# Patient Record
Sex: Female | Born: 1960 | Race: White | Hispanic: No | Marital: Married | Smoking: Former smoker
Health system: Southern US, Community
[De-identification: ages and names within clinical notes are randomized; demographics above are authoritative.]

## PROBLEM LIST (undated history)

## (undated) DIAGNOSIS — C439 Malignant melanoma of skin, unspecified: Secondary | ICD-10-CM

## (undated) DIAGNOSIS — E039 Hypothyroidism, unspecified: Secondary | ICD-10-CM

## (undated) DIAGNOSIS — C73 Malignant neoplasm of thyroid gland: Secondary | ICD-10-CM

## (undated) HISTORY — PX: THYROIDECTOMY, PARTIAL: SHX18

---

## 1999-03-26 DIAGNOSIS — C73 Malignant neoplasm of thyroid gland: Secondary | ICD-10-CM

## 1999-03-26 HISTORY — DX: Malignant neoplasm of thyroid gland: C73

## 2003-03-26 HISTORY — PX: BREAST BIOPSY: SHX20

## 2004-10-17 ENCOUNTER — Ambulatory Visit: Payer: Self-pay | Admitting: Internal Medicine

## 2005-09-04 ENCOUNTER — Ambulatory Visit: Payer: Self-pay | Admitting: Internal Medicine

## 2005-10-31 ENCOUNTER — Ambulatory Visit: Payer: Self-pay | Admitting: Internal Medicine

## 2006-03-03 ENCOUNTER — Ambulatory Visit: Payer: Self-pay | Admitting: Internal Medicine

## 2006-11-03 ENCOUNTER — Ambulatory Visit: Payer: Self-pay | Admitting: Internal Medicine

## 2007-03-30 ENCOUNTER — Ambulatory Visit: Payer: Self-pay | Admitting: Internal Medicine

## 2009-03-25 DIAGNOSIS — C439 Malignant melanoma of skin, unspecified: Secondary | ICD-10-CM

## 2009-03-25 HISTORY — PX: MELANOMA EXCISION: SHX5266

## 2009-03-25 HISTORY — DX: Malignant melanoma of skin, unspecified: C43.9

## 2009-06-09 ENCOUNTER — Ambulatory Visit: Payer: Self-pay | Admitting: Internal Medicine

## 2010-07-03 ENCOUNTER — Ambulatory Visit: Payer: Self-pay | Admitting: Internal Medicine

## 2011-10-31 ENCOUNTER — Ambulatory Visit: Payer: Self-pay | Admitting: Family Medicine

## 2012-11-03 ENCOUNTER — Ambulatory Visit: Payer: Self-pay | Admitting: Family Medicine

## 2014-02-08 ENCOUNTER — Ambulatory Visit: Payer: Self-pay | Admitting: Family Medicine

## 2015-01-31 ENCOUNTER — Other Ambulatory Visit: Payer: Self-pay | Admitting: Family Medicine

## 2015-01-31 DIAGNOSIS — Z1231 Encounter for screening mammogram for malignant neoplasm of breast: Secondary | ICD-10-CM

## 2015-02-14 ENCOUNTER — Ambulatory Visit
Admission: RE | Admit: 2015-02-14 | Discharge: 2015-02-14 | Disposition: A | Discharge status: Home / Self Care | Payer: BC Managed Care – PPO | Source: Ambulatory Visit | Attending: Family Medicine | Admitting: Family Medicine

## 2015-02-14 DIAGNOSIS — Z1231 Encounter for screening mammogram for malignant neoplasm of breast: Secondary | ICD-10-CM | POA: Diagnosis not present

## 2015-02-15 ENCOUNTER — Other Ambulatory Visit: Payer: Self-pay | Admitting: Psychiatry

## 2016-05-29 ENCOUNTER — Other Ambulatory Visit: Payer: Self-pay | Admitting: Family Medicine

## 2016-05-29 DIAGNOSIS — Z1231 Encounter for screening mammogram for malignant neoplasm of breast: Secondary | ICD-10-CM

## 2016-06-04 ENCOUNTER — Ambulatory Visit: Payer: BC Managed Care – PPO

## 2016-06-11 ENCOUNTER — Ambulatory Visit
Admission: RE | Admit: 2016-06-11 | Discharge: 2016-06-11 | Disposition: A | Discharge status: Home / Self Care | Payer: BC Managed Care – PPO | Source: Ambulatory Visit | Attending: Family Medicine | Admitting: Family Medicine

## 2016-06-11 DIAGNOSIS — Z1231 Encounter for screening mammogram for malignant neoplasm of breast: Secondary | ICD-10-CM | POA: Insufficient documentation

## 2016-06-12 ENCOUNTER — Other Ambulatory Visit: Payer: Self-pay | Admitting: Family Medicine

## 2016-06-12 DIAGNOSIS — R928 Other abnormal and inconclusive findings on diagnostic imaging of breast: Secondary | ICD-10-CM

## 2016-06-28 ENCOUNTER — Ambulatory Visit
Admission: RE | Admit: 2016-06-28 | Discharge: 2016-06-28 | Disposition: A | Discharge status: Home / Self Care | Payer: BC Managed Care – PPO | Source: Ambulatory Visit | Attending: Family Medicine | Admitting: Family Medicine

## 2016-06-28 DIAGNOSIS — R928 Other abnormal and inconclusive findings on diagnostic imaging of breast: Secondary | ICD-10-CM

## 2016-06-28 DIAGNOSIS — N6320 Unspecified lump in the left breast, unspecified quadrant: Secondary | ICD-10-CM | POA: Insufficient documentation

## 2016-06-28 DIAGNOSIS — N6489 Other specified disorders of breast: Secondary | ICD-10-CM | POA: Diagnosis not present

## 2016-07-02 ENCOUNTER — Other Ambulatory Visit: Payer: Self-pay | Admitting: Family Medicine

## 2016-07-02 DIAGNOSIS — R928 Other abnormal and inconclusive findings on diagnostic imaging of breast: Secondary | ICD-10-CM

## 2016-07-02 DIAGNOSIS — N6320 Unspecified lump in the left breast, unspecified quadrant: Secondary | ICD-10-CM

## 2016-07-11 ENCOUNTER — Ambulatory Visit
Admission: RE | Admit: 2016-07-11 | Discharge: 2016-07-11 | Disposition: A | Discharge status: Home / Self Care | Payer: BC Managed Care – PPO | Source: Ambulatory Visit | Attending: Family Medicine | Admitting: Family Medicine

## 2016-07-11 DIAGNOSIS — N62 Hypertrophy of breast: Secondary | ICD-10-CM | POA: Insufficient documentation

## 2016-07-11 DIAGNOSIS — N6320 Unspecified lump in the left breast, unspecified quadrant: Secondary | ICD-10-CM | POA: Diagnosis present

## 2016-07-11 DIAGNOSIS — R928 Other abnormal and inconclusive findings on diagnostic imaging of breast: Secondary | ICD-10-CM

## 2016-07-11 HISTORY — PX: BREAST BIOPSY: SHX20

## 2016-07-12 LAB — SURGICAL PATHOLOGY

## 2016-07-29 ENCOUNTER — Other Ambulatory Visit: Payer: Self-pay | Admitting: Surgery

## 2016-07-29 DIAGNOSIS — N6320 Unspecified lump in the left breast, unspecified quadrant: Secondary | ICD-10-CM

## 2016-07-31 ENCOUNTER — Encounter
Admission: RE | Admit: 2016-07-31 | Discharge: 2016-07-31 | Disposition: A | Discharge status: Home / Self Care | Payer: BC Managed Care – PPO | Source: Ambulatory Visit | Attending: Surgery | Admitting: Surgery

## 2016-07-31 HISTORY — DX: Hypothyroidism, unspecified: E03.9

## 2016-07-31 NOTE — Patient Instructions (Addendum)
  Your procedure is scheduled on: 08-06-16 [place] @ 7:45 AM  Remember: Instructions that are not followed completely may result in serious medical risk, up to and including death, or upon the discretion of your surgeon and anesthesiologist your surgery may need to be rescheduled.    _x___ 1. Do not eat food or drink liquids after midnight. No gum chewing or  hard candies.     __x__ 2. No Alcohol for 24 hours before or after surgery.   __x__3. No Smoking for 24 prior to surgery.   ____  4. Bring all medications with you on the day of surgery if instructed.    __x__ 5. Notify your doctor if there is any change in your medical condition     (cold, fever, infections).     Do not wear jewelry, make-up, hairpins, clips or nail polish.  Do not wear lotions, powders, or perfumes. You may wear deodorant.  Do not shave 48 hours prior to surgery. Men may shave face and neck.  Do not bring valuables to the hospital.    [facility] is not responsible for any belongings or valuables.               Contacts, dentures or bridgework may not be worn into surgery.  Leave your suitcase in the car. After surgery it may be brought to your room.  For patients admitted to the hospital, discharge time is determined by your                       treatment team.   Patients discharged the day of surgery will not be allowed to drive home.  You will need someone to drive you home and stay with you the night of your procedure.    Please read over the following fact sheets that you were given:   [facility] Preparing for Surgery and or MRSA Information   _x___ Take anti-hypertensive (unless it includes a diuretic), cardiac, seizure, asthma,     anti-reflux and psychiatric medicines. These include:  1. LEVOTHYROXINE  2.  3.  4.  5.  6.  ____Fleets enema or Magnesium Citrate as directed.   ____ Use CHG Soap or sage wipes as directed on instruction sheet   ____ Use inhalers on the day  of surgery and bring to hospital day of surgery  ____ Stop Metformin and Janumet 2 days prior to surgery.    ____ Take 1/2 of usual insulin dose the night before surgery and none on the morning     surgery.   ____ Follow recommendations from Cardiologist, Pulmonologist or PCP regarding          stopping Aspirin, Coumadin, Pllavix ,Eliquis, Effient, or Pradaxa, and Pletal.  X____Stop Anti-inflammatories such as Advil, Aleve, Ibuprofen, Motrin, Naproxen, Naprosyn, Goodies powders or aspirin products NOW-OK to take Tylenol    ____ Stop supplements until after surgery.     ____ Bring C-Pap to the hospital.

## 2016-08-06 ENCOUNTER — Ambulatory Visit
Admission: RE | Admit: 2016-08-06 | Discharge: 2016-08-06 | Disposition: A | Discharge status: Home / Self Care | Payer: BC Managed Care – PPO | Source: Ambulatory Visit | Attending: Surgery | Admitting: Surgery

## 2016-08-06 ENCOUNTER — Encounter: Payer: Self-pay | Admitting: *Deleted

## 2016-08-06 ENCOUNTER — Encounter
Admission: RE | Disposition: A | Discharge status: Home / Self Care | Payer: Self-pay | Source: Ambulatory Visit | Attending: Surgery

## 2016-08-06 ENCOUNTER — Ambulatory Visit: Payer: BC Managed Care – PPO | Admitting: Certified Registered"

## 2016-08-06 DIAGNOSIS — Z87891 Personal history of nicotine dependence: Secondary | ICD-10-CM | POA: Insufficient documentation

## 2016-08-06 DIAGNOSIS — Z79899 Other long term (current) drug therapy: Secondary | ICD-10-CM | POA: Diagnosis not present

## 2016-08-06 DIAGNOSIS — Z85820 Personal history of malignant melanoma of skin: Secondary | ICD-10-CM | POA: Diagnosis not present

## 2016-08-06 DIAGNOSIS — Z85850 Personal history of malignant neoplasm of thyroid: Secondary | ICD-10-CM | POA: Insufficient documentation

## 2016-08-06 DIAGNOSIS — E890 Postprocedural hypothyroidism: Secondary | ICD-10-CM | POA: Insufficient documentation

## 2016-08-06 DIAGNOSIS — N62 Hypertrophy of breast: Secondary | ICD-10-CM | POA: Diagnosis not present

## 2016-08-06 DIAGNOSIS — E7800 Pure hypercholesterolemia, unspecified: Secondary | ICD-10-CM | POA: Diagnosis not present

## 2016-08-06 DIAGNOSIS — N6321 Unspecified lump in the left breast, upper outer quadrant: Secondary | ICD-10-CM | POA: Diagnosis present

## 2016-08-06 DIAGNOSIS — N6022 Fibroadenosis of left breast: Secondary | ICD-10-CM | POA: Insufficient documentation

## 2016-08-06 DIAGNOSIS — Z85828 Personal history of other malignant neoplasm of skin: Secondary | ICD-10-CM | POA: Insufficient documentation

## 2016-08-06 DIAGNOSIS — N6320 Unspecified lump in the left breast, unspecified quadrant: Secondary | ICD-10-CM

## 2016-08-06 HISTORY — PX: BREAST LUMPECTOMY WITH NEEDLE LOCALIZATION: SHX5759

## 2016-08-06 HISTORY — PX: BREAST EXCISIONAL BIOPSY: SUR124

## 2016-08-06 SURGERY — BREAST LUMPECTOMY WITH NEEDLE LOCALIZATION
Anesthesia: General | Laterality: Left | Wound class: Clean

## 2016-08-06 MED ORDER — LACTATED RINGERS IV SOLN
INTRAVENOUS | Status: DC
  Administered 2016-08-06: 50 mL/h via INTRAVENOUS

## 2016-08-06 MED ORDER — MIDAZOLAM HCL 2 MG/2ML IJ SOLN
INTRAMUSCULAR | Status: AC
  Filled 2016-08-06: qty 2

## 2016-08-06 MED ORDER — PROPOFOL 10 MG/ML IV BOLUS
INTRAVENOUS | Status: AC
  Filled 2016-08-06: qty 20

## 2016-08-06 MED ORDER — FENTANYL CITRATE (PF) 100 MCG/2ML IJ SOLN
INTRAMUSCULAR | Status: DC | PRN
  Administered 2016-08-06 (×2): 50 ug via INTRAVENOUS

## 2016-08-06 MED ORDER — DEXAMETHASONE SODIUM PHOSPHATE 10 MG/ML IJ SOLN
INTRAMUSCULAR | Status: DC | PRN
  Administered 2016-08-06: 4 mg via INTRAVENOUS

## 2016-08-06 MED ORDER — HYDROCODONE-ACETAMINOPHEN 5-325 MG PO TABS: 1.0000 | ORAL_TABLET | ORAL | 0 refills | Status: DC | PRN

## 2016-08-06 MED ORDER — FAMOTIDINE 20 MG PO TABS
20.0000 mg | ORAL_TABLET | Freq: Once | ORAL | Status: AC
  Administered 2016-08-06: 20 mg via ORAL

## 2016-08-06 MED ORDER — DEXAMETHASONE SODIUM PHOSPHATE 10 MG/ML IJ SOLN
INTRAMUSCULAR | Status: AC
  Filled 2016-08-06: qty 1

## 2016-08-06 MED ORDER — HYDROCODONE-ACETAMINOPHEN 5-325 MG PO TABS: 1.0000 | ORAL_TABLET | ORAL | Status: DC | PRN

## 2016-08-06 MED ORDER — FENTANYL CITRATE (PF) 100 MCG/2ML IJ SOLN
INTRAMUSCULAR | Status: AC
  Filled 2016-08-06: qty 4

## 2016-08-06 MED ORDER — MIDAZOLAM HCL 2 MG/2ML IJ SOLN
INTRAMUSCULAR | Status: DC | PRN
  Administered 2016-08-06: 2 mg via INTRAVENOUS

## 2016-08-06 MED ORDER — ONDANSETRON HCL 4 MG/2ML IJ SOLN: 4.0000 mg | Freq: Once | INTRAMUSCULAR | Status: DC | PRN

## 2016-08-06 MED ORDER — FAMOTIDINE 20 MG PO TABS
ORAL_TABLET | ORAL | Status: AC
  Administered 2016-08-06: 20 mg via ORAL
  Filled 2016-08-06: qty 1

## 2016-08-06 MED ORDER — ONDANSETRON HCL 4 MG/2ML IJ SOLN
INTRAMUSCULAR | Status: AC
  Filled 2016-08-06: qty 2

## 2016-08-06 MED ORDER — PROPOFOL 10 MG/ML IV BOLUS
INTRAVENOUS | Status: DC | PRN
  Administered 2016-08-06: 200 mg via INTRAVENOUS

## 2016-08-06 MED ORDER — BUPIVACAINE-EPINEPHRINE (PF) 0.5% -1:200000 IJ SOLN
INTRAMUSCULAR | Status: AC
  Filled 2016-08-06: qty 30

## 2016-08-06 MED ORDER — BUPIVACAINE-EPINEPHRINE 0.5% -1:200000 IJ SOLN
INTRAMUSCULAR | Status: DC | PRN
  Administered 2016-08-06: 10 mL

## 2016-08-06 MED ORDER — FENTANYL CITRATE (PF) 100 MCG/2ML IJ SOLN
25.0000 ug | INTRAMUSCULAR | Status: DC | PRN
Start: 2016-08-06 — End: 2016-08-06

## 2016-08-06 MED ORDER — ONDANSETRON HCL 4 MG/2ML IJ SOLN
INTRAMUSCULAR | Status: DC | PRN
  Administered 2016-08-06: 4 mg via INTRAVENOUS

## 2016-08-06 SURGICAL SUPPLY — 27 items
BLADE SURG 15 STRL LF DISP TIS (BLADE) ×1 IMPLANT
BLADE SURG 15 STRL SS (BLADE) ×2
CANISTER SUCT 1200ML W/VALVE (MISCELLANEOUS) ×3 IMPLANT
CHLORAPREP W/TINT 26ML (MISCELLANEOUS) ×3 IMPLANT
DERMABOND ADVANCED (GAUZE/BANDAGES/DRESSINGS) ×2
DERMABOND ADVANCED .7 DNX12 (GAUZE/BANDAGES/DRESSINGS) ×1 IMPLANT
DEVICE DUBIN SPECIMEN MAMMOGRA (MISCELLANEOUS) ×3 IMPLANT
DRAPE LAPAROTOMY 77X122 PED (DRAPES) ×3 IMPLANT
ELECT REM PT RETURN 9FT ADLT (ELECTROSURGICAL) ×3
ELECTRODE REM PT RTRN 9FT ADLT (ELECTROSURGICAL) ×1 IMPLANT
GLOVE BIO SURGEON STRL SZ7.5 (GLOVE) ×3 IMPLANT
GOWN STRL REUS W/ TWL LRG LVL3 (GOWN DISPOSABLE) ×2 IMPLANT
GOWN STRL REUS W/TWL LRG LVL3 (GOWN DISPOSABLE) ×4
KIT RM TURNOVER STRD PROC AR (KITS) ×3 IMPLANT
LABEL OR SOLS (LABEL) ×3 IMPLANT
MARGIN MAP 10MM (MISCELLANEOUS) ×3 IMPLANT
NEEDLE HYPO 25X1 1.5 SAFETY (NEEDLE) ×3 IMPLANT
PACK BASIN MINOR ARMC (MISCELLANEOUS) ×3 IMPLANT
SUT CHROMIC 3 0 SH 27 (SUTURE) IMPLANT
SUT CHROMIC 4 0 RB 1X27 (SUTURE) ×3 IMPLANT
SUT ETHILON 3-0 FS-10 30 BLK (SUTURE) ×3
SUT MNCRL 4-0 (SUTURE) ×2
SUT MNCRL 4-0 27XMFL (SUTURE) ×1
SUTURE EHLN 3-0 FS-10 30 BLK (SUTURE) ×1 IMPLANT
SUTURE MNCRL 4-0 27XMF (SUTURE) ×1 IMPLANT
SYRINGE 10CC LL (SYRINGE) ×3 IMPLANT
WATER STERILE IRR 1000ML POUR (IV SOLUTION) ×3 IMPLANT

## 2016-08-06 NOTE — Anesthesia Postprocedure Evaluation (Signed)
Anesthesia Post Note  Patient: Tanya Vega  Procedure(s) Performed: Procedure(s) (LRB): BREAST LUMPECTOMY WITH NEEDLE LOCALIZATION (Left)  Patient location during evaluation: PACU Anesthesia Type: General Level of consciousness: awake and alert Pain management: pain level controlled Vital Signs Assessment: post-procedure vital signs reviewed and stable Respiratory status: spontaneous breathing and respiratory function stable Cardiovascular status: stable Anesthetic complications: no     Last Vitals:  Vitals:   08/06/16 1217 08/06/16 1246  BP: 136/79 136/75  Pulse:  75  Resp:  16  Temp:      Last Pain:  Vitals:   08/06/16 1214  TempSrc: Temporal                 [name] K

## 2016-08-06 NOTE — H&P (Signed)
  She reports no change in condition since office exam.  Has had Kopan's wire inserted , viewed mammograms.  Labs noted.  Discussed plan for excision left breast mass.  Left side marked YES

## 2016-08-06 NOTE — Transfer of Care (Signed)
Immediate Anesthesia Transfer of Care Note  Patient: Tanya Vega  Procedure(s) Performed: Procedure(s): BREAST LUMPECTOMY WITH NEEDLE LOCALIZATION (Left)  Patient Location: PACU  Anesthesia Type:General  Level of Consciousness: awake and responds to stimulation  Airway & Oxygen Therapy: Patient Spontanous Breathing and Patient connected to face mask oxygen  Post-op Assessment: Report given to RN and Post -op Vital signs reviewed and stable  Post vital signs: Reviewed and stable  Last Vitals:  Vitals:   08/06/16 1132 08/06/16 1133  BP: (P) 126/77 126/77  Pulse:  96  Resp:  13  Temp: (P) 37 C     Last Pain:  Vitals:   08/06/16 0854  TempSrc: Tympanic      Patients Stated Pain Goal: 0 (41/28/78 6767)  Complications: No apparent anesthesia complications

## 2016-08-06 NOTE — Anesthesia Post-op Follow-up Note (Cosign Needed)
Anesthesia QCDR form completed.        

## 2016-08-06 NOTE — Op Note (Signed)
OPERATIVE REPORT  PREOPERATIVE  DIAGNOSIS: . Left breast mass  POSTOPERATIVE DIAGNOSIS: . Left breast mass  PROCEDURE: . Excision left breast mass  ANESTHESIA:  General  SURGEON: Rochel Brome  MD   INDICATIONS: . She had recent radiographic findings of an abnormality in the upper outer quadrant of the left breast ultrasound demonstrated a 7 mm nodule. Biopsy demonstrated a radial scar. Excision was recommended for further evaluation. She did have preoperative x-ray needle localization with insertion of Kopan's wire. The images were reviewed prior to incision.  With the patient on the operating table in the supine position she was placed under general anesthesia. The dressing was removed from the left breast exposing the Kopan's wire which entered the breast at approximately 2:30 position. The wire was cut 2 cm from the skin. The breast was prepared with ChloraPrep and draped in a sterile manner. A curvilinear incision was made in the upper outer quadrant 4 cm from the nipple carried down through subcutaneous tissues to encounter the wire. A portion of tissue surrounding the wire was removed which was approximately 2 x 2 x 3 cm in dimension. This was labeled with margin maps suturing markers to the medial lateral cranial caudal and deep margins. The Kopan's wire was left intact with the specimen. The specimen mammogram demonstrated the location of the biopsy marker in the specimen. The specimen was submitted for pathology.  The wound was inspected and several small bleeding points were cauterized. The subcuticular tissues were infiltrated with half percent Sensorcaine with epinephrine. Deeper tissues surrounding cautery artifact were also infiltrated. Subcutaneous tissues were approximated with 4-0 chromic. The skin was closed with running 4-0 Monocryl subcuticular suture and Dermabond.  The patient tolerated surgery satisfactorily and was then prepared for transfer to the recovery room  [facility].D.

## 2016-08-06 NOTE — Anesthesia Procedure Notes (Signed)
Procedure Name: LMA Insertion Performed by: Lance Muss Pre-anesthesia Checklist: Patient identified, Patient being monitored, Timeout performed, Emergency Drugs available and Suction available Patient Re-evaluated:Patient Re-evaluated prior to inductionOxygen Delivery Method: Circle system utilized Preoxygenation: Pre-oxygenation with 100% oxygen Intubation Type: IV induction Ventilation: Mask ventilation without difficulty LMA: LMA inserted LMA Size: 3.5 Tube type: Oral Number of attempts: 1 Placement Confirmation: positive ETCO2 and breath sounds checked- equal and bilateral Tube secured with: Tape Dental Injury: Teeth and Oropharynx as per pre-operative assessment

## 2016-08-06 NOTE — Discharge Instructions (Addendum)
Take Tylenol or Norco if needed for pain. ° °Should not drive or do anything dangerous when taking Norco. ° °May shower and blot dry. ° °Wear bra as desired for comfort and support. ° °AMBULATORY SURGERY  °DISCHARGE INSTRUCTIONS ° ° °1) The drugs that you were given will stay in your system until tomorrow so for the next 24 hours you should not: ° °A) Drive an automobile °B) Make any legal decisions °C) Drink any alcoholic beverage ° ° °2) You may resume regular meals tomorrow.  Today it is better to start with liquids and gradually work up to solid foods. ° °You may eat anything you prefer, but it is better to start with liquids, then soup and crackers, and gradually work up to solid foods. ° ° °3) Please notify your doctor immediately if you have any unusual bleeding, trouble breathing, redness and pain at the surgery site, drainage, fever, or pain not relieved by medication. ° ° ° °4) Additional Instructions: ° ° ° ° ° ° ° °Please contact your physician with any problems or Same Day Surgery at [phone], Monday through Friday 6 am to 4 pm, or  at [place] Main number at [phone]. °

## 2016-08-06 NOTE — Anesthesia Preprocedure Evaluation (Signed)
Anesthesia Evaluation  Patient identified by MRN, date of birth, ID band Patient awake    Reviewed: Allergy & Precautions, NPO status , Patient's Chart, lab work & pertinent test results  History of Anesthesia Complications Negative for: history of anesthetic complications  Airway Mallampati: II       Dental   Pulmonary neg pulmonary ROS, former smoker,           Cardiovascular negative cardio ROS       Neuro/Psych negative neurological ROS     GI/Hepatic negative GI ROS, Neg liver ROS,   Endo/Other  negative endocrine ROSHypothyroidism (s/p thyroid cancer)   Renal/GU negative Renal ROS     Musculoskeletal   Abdominal   Peds  Hematology negative hematology ROS (+)   Anesthesia Other Findings   Reproductive/Obstetrics                             Anesthesia Physical Anesthesia Plan  ASA: II  Anesthesia Plan: General   Post-op Pain Management:    Induction: Intravenous  Airway Management Planned: LMA  Additional Equipment:   Intra-op Plan:   Post-operative Plan:   Informed Consent: I have reviewed the patients History and Physical, chart, labs and discussed the procedure including the risks, benefits and alternatives for the proposed anesthesia with the patient or authorized representative who has indicated his/her understanding and acceptance.     Plan Discussed with:   Anesthesia Plan Comments:         Anesthesia Quick Evaluation

## 2016-08-07 LAB — SURGICAL PATHOLOGY

## 2017-02-06 ENCOUNTER — Other Ambulatory Visit: Payer: Self-pay | Admitting: Family Medicine

## 2017-02-06 DIAGNOSIS — R109 Unspecified abdominal pain: Secondary | ICD-10-CM

## 2017-02-12 ENCOUNTER — Other Ambulatory Visit: Payer: Self-pay | Admitting: Internal Medicine

## 2017-02-12 ENCOUNTER — Ambulatory Visit
Admission: RE | Admit: 2017-02-12 | Discharge: 2017-02-12 | Disposition: A | Discharge status: Home / Self Care | Payer: BC Managed Care – PPO | Source: Ambulatory Visit | Attending: Family Medicine | Admitting: Family Medicine

## 2017-02-12 ENCOUNTER — Other Ambulatory Visit: Payer: Self-pay | Admitting: Family Medicine

## 2017-02-12 DIAGNOSIS — N2889 Other specified disorders of kidney and ureter: Secondary | ICD-10-CM | POA: Diagnosis not present

## 2017-02-12 DIAGNOSIS — C7801 Secondary malignant neoplasm of right lung: Secondary | ICD-10-CM | POA: Diagnosis not present

## 2017-02-12 DIAGNOSIS — C787 Secondary malignant neoplasm of liver and intrahepatic bile duct: Secondary | ICD-10-CM | POA: Insufficient documentation

## 2017-02-12 DIAGNOSIS — R9389 Abnormal findings on diagnostic imaging of other specified body structures: Secondary | ICD-10-CM | POA: Insufficient documentation

## 2017-02-12 DIAGNOSIS — R109 Unspecified abdominal pain: Secondary | ICD-10-CM | POA: Diagnosis present

## 2017-02-12 DIAGNOSIS — K769 Liver disease, unspecified: Secondary | ICD-10-CM

## 2017-02-12 DIAGNOSIS — C7802 Secondary malignant neoplasm of left lung: Secondary | ICD-10-CM | POA: Diagnosis not present

## 2017-02-12 DIAGNOSIS — M8448XA Pathological fracture, other site, initial encounter for fracture: Secondary | ICD-10-CM | POA: Diagnosis not present

## 2017-02-12 HISTORY — DX: Malignant melanoma of skin, unspecified: C43.9

## 2017-02-12 HISTORY — DX: Malignant neoplasm of thyroid gland: C73

## 2017-02-12 MED ORDER — IOPAMIDOL (ISOVUE-300) INJECTION 61%
100.0000 mL | Freq: Once | INTRAVENOUS | Status: AC | PRN
  Administered 2017-02-12: 100 mL via INTRAVENOUS

## 2017-02-12 NOTE — Progress Notes (Signed)
Spoke to Dr.L- will order US guided liver Bx STAT. [spoke to Dr.Hoss- recommends liver bx]. Will plan to se pt in [place] [facility] on 11/27.   Also, added to tumor conference for 11/29.

## 2017-02-18 ENCOUNTER — Inpatient Hospital Stay: Payer: BC Managed Care – PPO

## 2017-02-18 ENCOUNTER — Inpatient Hospital Stay: Payer: BC Managed Care – PPO | Attending: Internal Medicine | Admitting: Internal Medicine

## 2017-02-18 ENCOUNTER — Encounter: Payer: Self-pay | Admitting: Internal Medicine

## 2017-02-18 ENCOUNTER — Other Ambulatory Visit: Payer: Self-pay

## 2017-02-18 VITALS — BP 173/109 | HR 103 | Temp 97.6°F | Resp 20 | Ht 64.0 in | Wt 147.3 lb

## 2017-02-18 DIAGNOSIS — C801 Malignant (primary) neoplasm, unspecified: Secondary | ICD-10-CM | POA: Diagnosis not present

## 2017-02-18 DIAGNOSIS — N281 Cyst of kidney, acquired: Secondary | ICD-10-CM | POA: Diagnosis not present

## 2017-02-18 DIAGNOSIS — Z87891 Personal history of nicotine dependence: Secondary | ICD-10-CM

## 2017-02-18 DIAGNOSIS — C787 Secondary malignant neoplasm of liver and intrahepatic bile duct: Secondary | ICD-10-CM | POA: Diagnosis not present

## 2017-02-18 DIAGNOSIS — Z85850 Personal history of malignant neoplasm of thyroid: Secondary | ICD-10-CM | POA: Diagnosis not present

## 2017-02-18 DIAGNOSIS — Z85820 Personal history of malignant melanoma of skin: Secondary | ICD-10-CM

## 2017-02-18 DIAGNOSIS — K769 Liver disease, unspecified: Secondary | ICD-10-CM

## 2017-02-18 DIAGNOSIS — D259 Leiomyoma of uterus, unspecified: Secondary | ICD-10-CM | POA: Insufficient documentation

## 2017-02-18 DIAGNOSIS — Z79899 Other long term (current) drug therapy: Secondary | ICD-10-CM

## 2017-02-18 DIAGNOSIS — M8448XA Pathological fracture, other site, initial encounter for fracture: Secondary | ICD-10-CM | POA: Diagnosis not present

## 2017-02-18 DIAGNOSIS — C7951 Secondary malignant neoplasm of bone: Secondary | ICD-10-CM

## 2017-02-18 DIAGNOSIS — E039 Hypothyroidism, unspecified: Secondary | ICD-10-CM | POA: Insufficient documentation

## 2017-02-18 LAB — LACTATE DEHYDROGENASE: LDH: 294 U/L — AB (ref 98–192)

## 2017-02-18 NOTE — Progress Notes (Signed)
Pt here for new consult with Dr. Rogue Bussing. Newly dx with liver mass. She denies any shortness of breath/chest pain. denies NVD; reports no change in bladder/bowel habits or weight loss. Patient states that she has liver biopsy planned for this Friday. She reports a family h/o breast cancer (mom and aunt) and colon cancer (brother). 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

## 2017-02-18 NOTE — Assessment & Plan Note (Signed)
Multiple metastatic lesions to the liver; bilateral lung nodules; L1 vertebral compression fracture- highly suggestive of malignancy/metastatic. I'm highly suspicious of recurrent melanoma based on history.  # Recommend biopsy- liver biopsy versus right neck lymph node biopsy. We will discuss of the tumor conference. Recommend PET scan and MRI of the brain. Check CBC CMP and LDH.  # History of thyroid cancer- appears early-stage; on Synthroid. Less likely recurrent.  # L1 vertebral body lesion- pathologic compression fracture- on tramadol pain control. Recommend evaluation with PET scan; radiation oncology referral.  # Long discussion the patient and family regarding the overall seriousness of the diagnosis/prognosis. However further details to be discussed only after biopsy results. Patient will follow-up with me in approximately 1 week/X [place]. Referral to Dr. Donella Stade radiation  Thank you Dr. Netty Starring for allowing me to participate in the care of your pleasant patient. Please do not hesitate to contact me with questions or concerns in the interim.  # # I reviewed the blood work- with the patient in detail; also reviewed the imaging independently [as summarized above]; and with the patient in detail.

## 2017-02-18 NOTE — Progress Notes (Signed)
[place] NOTE  Patient Care Team: Dion Body, MD as PCP - General (Family Medicine)  CHIEF COMPLAINTS/PURPOSE OF CONSULTATION:  Multiple liver lesions  # NOV 2018- MULTIPLE LIVER LESIONS/lung lesions [awaiting Bx- on Nov 30th]  # Hx of Melanoma- dec 2011 [SLNBx- 0/4-Neg;[facility] ]; Hx of thyroid cancer- feb 2001- s/p left throid lobectomy [[place]]; NO RAIU.    No history exists.     HISTORY OF PRESENTING ILLNESS:  Tanya Vega 56 y.o.  female prior history of melanoma 2011 [unclear stage]; also history of thyroid cancer [2001]- has been referred to [place] for further evaluation and recommendations for her abnormal CT scan.  Patient stated that she noted to have back pain in end of October and especially getting up and out of the vehicle. The pain progressed and got worse. Patient also noted to have worsening abdominal distention; and also noted to have abdominal pain mostly in the right upper quadrant.  Patient denies her appetite is good in general. She denies any weight loss. No nausea no vomiting. Intermittent headaches.  She also noted to have a right neck mass approximately 2 cm in size in the last 2 weeks. She denies any pain.  Patient denies any shortness of breath or chest pain. No cough hemoptysis.  ROS: A complete 10 point review of system is done which is negative except mentioned above in history of present illness  MEDICAL HISTORY:  Past Medical History:  Diagnosis Date  . Hypothyroidism   . Melanoma of skin ([place]) 2011   Resected from Right neck area with 4 lymph nodes as well.   . Thyroid cancer ([place]) 2001   Partial thyroidectomy    SURGICAL HISTORY: Past Surgical History:  Procedure Laterality Date  . BREAST BIOPSY Right 2005   neg  . BREAST BIOPSY Left 07/11/2016   radial scar  . BREAST EXCISIONAL BIOPSY Left 08/06/2016   lumpectomy for radial scar  . BREAST LUMPECTOMY WITH NEEDLE LOCALIZATION Left 08/06/2016   Procedure:  BREAST LUMPECTOMY WITH NEEDLE LOCALIZATION;  Surgeon: Leonie Green, MD;  Location: [facility] ORS;  Service: General;  Laterality: Left;  Marland Kitchen MELANOMA EXCISION  2011   ear  . 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 Daughter- 63.  Social History   Socioeconomic History  . Marital status: Married    Spouse name: Not on file  . Number of children: Not on file  . Years of education: Not on file  . Highest education level: Not on file  Social Needs  . Financial resource strain: Not on file  . Food insecurity - worry: Not on file  . Food insecurity - inability: Not on file  . Transportation needs - medical: Not on file  . Transportation needs - non-medical: Not on file  Occupational History  . Not on file  Tobacco Use  . Smoking status: Former Smoker    Packs/day: 0.25    Years: 20.00    Pack years: 5.00    Types: Cigarettes    Last attempt to quit: 03/26/1999    Years since quitting: 17.9  . Smokeless tobacco: Never Used  Substance and Sexual Activity  . Alcohol use: Yes    Comment: WEEKENDS  . Drug use: No  . Sexual activity: Yes  Other Topics Concern  . Not on file  Social History Narrative  . Not on file    FAMILY HISTORY: Brother died of  colon. Mom-survived.  Family History  Problem Relation Age of Onset  . Breast cancer Mother 43  . Breast cancer Paternal Aunt   . Colon cancer Brother 61    ALLERGIES:  is allergic to tetanus-diphtheria toxoids td.  MEDICATIONS:  Current Outpatient Medications  Medication Sig Dispense Refill  . atorvastatin (LIPITOR) 40 MG tablet Take 40 mg by mouth every evening.    Marland Kitchen levothyroxine (SYNTHROID, LEVOTHROID) 100 MCG tablet Take 100 mcg by mouth daily before breakfast.    . traMADol (ULTRAM) 50 MG tablet Take 1 tablet by mouth every 6 (six) hours as needed for pain.     No current facility-administered medications for this visit.        Marland Kitchen  PHYSICAL EXAMINATION: ECOG PERFORMANCE STATUS: 1 - Symptomatic but completely ambulatory  Vitals:   02/18/17 1340  BP: (!) 173/109  Pulse: (!) 103  Resp: 20  Temp: 97.6 F (36.4 C)   Filed Weights   02/18/17 1340  Weight: 147 lb 4.3 oz (66.8 kg)    GENERAL: Well-nourished well-developed; Alert, no distress and comfortable.   Accompanied by family. EYES: no pallor or icterus OROPHARYNX: no thrush or ulceration; good dentition  NECK: supple, no masses felt; previous melanoma surgery scar noted level II lymph node approximately 2 cm in size; nontender. LYMPH:  no palpable lymphadenopathy in the axillary or inguinal regions LUNGS: clear to auscultation and  No wheeze or crackles HEART/CVS: regular rate & rhythm and no murmurs; No lower extremity edema ABDOMEN: abdomen soft, non-tender and normal bowel sounds Musculoskeletal:no cyanosis of digits and no clubbing  PSYCH: alert & oriented x 3 with fluent speech NEURO: no focal motor/sensory deficits SKIN:  no rashes or significant lesions  LABORATORY DATA:  I have reviewed the data as listed No results found for: WBC, HGB, HCT, MCV, PLT No results for input(s): NA, K, CL, CO2, GLUCOSE, BUN, CREATININE, CALCIUM, GFRNONAA, GFRAA, PROT, ALBUMIN, AST, ALT, ALKPHOS, BILITOT, BILIDIR, IBILI in the last 8760 hours.  RADIOGRAPHIC STUDIES: I have personally reviewed the radiological images as listed and agreed with the findings in the report. Ct Abdomen Pelvis W Contrast  Result Date: 02/12/2017 CLINICAL DATA:  Pelvic pain for 1 month. EXAM: CT ABDOMEN AND PELVIS WITH CONTRAST TECHNIQUE: Multidetector CT imaging of the abdomen and pelvis was performed using the standard protocol following bolus administration of intravenous contrast. CONTRAST:  153mL ISOVUE-300 IOPAMIDOL (ISOVUE-300) INJECTION 61% COMPARISON:  None. FINDINGS: Lower chest: Multiple pulmonary nodules are noted in both lung bases consistent with metastatic disease.  Hepatobiliary: No gallstones are noted. Multiple rounded hypodense lesions are seen throughout the hepatic parenchyma consistent with metastatic disease. Pancreas: Unremarkable. No pancreatic ductal dilatation or surrounding inflammatory changes. Spleen: Normal in size without focal abnormality. Adrenals/Urinary Tract: Adrenal glands appear normal. No hydronephrosis or renal obstruction is noted. No renal or ureteral calculi are noted. Urinary bladder is unremarkable. 2.1 x 1.3 cm complex low density is seen in mid pole of right kidney 1.8 x 1.2 cm complex low density is noted in midpole of left kidney. Stomach/Bowel: Stomach is within normal limits. Appendix appears normal. No evidence of bowel wall thickening, distention, or inflammatory changes. Vascular/Lymphatic: No significant vascular findings are present. No enlarged abdominal or pelvic lymph nodes. Reproductive: 4 cm uterine fibroid is noted. Ovaries are unremarkable. Other: 2.3 cm solid nodule is noted anteriorly in the pelvis concerning for metastatic disease or peritoneal carcinomatosis. Musculoskeletal: Lytic lesion is seen and L1 vertebral body resulting in mild compression fracture. This  is consistent with metastatic disease. IMPRESSION: Multiple pulmonary and hepatic metastatic lesions are noted. 2.3 cm solid nodule is noted anteriorly in the pelvis concerning for metastatic disease or peritoneal carcinomatosis. Pathologic fracture seen involving L1 vertebral body consistent with metastatic disease. Complex low density lesions are noted in both kidneys, with the largest measuring 2.1 cm on the right. These may simply represent complex renal cysts, but neoplasm cannot be excluded, and further evaluation with MRI is recommended. Probable 4 cm uterine fibroid. These results will be called to the ordering clinician or representative by the Radiologist Assistant, and communication documented in the PACS or zVision Dashboard. Electronically Signed   By:  Marijo Conception, M.D.   On: 02/12/2017 11:18    ASSESSMENT & PLAN:   Lesion of liver Multiple metastatic lesions to the liver; bilateral lung nodules; L1 vertebral compression fracture- highly suggestive of malignancy/metastatic. I'm highly suspicious of recurrent melanoma based on history.  # Recommend biopsy- liver biopsy versus right neck lymph node biopsy. We will discuss of the tumor conference. Recommend PET scan and MRI of the brain. Check CBC CMP and LDH.  # History of thyroid cancer- appears early-stage; on Synthroid. Less likely recurrent.  # L1 vertebral body lesion- pathologic compression fracture- on tramadol pain control. Recommend evaluation with PET scan; radiation oncology referral.  # Long discussion the patient and family regarding the overall seriousness of the diagnosis/prognosis. However further details to be discussed only after biopsy results. Patient will follow-up with me in approximately 1 week/X [place]. Referral to Dr. Donella Stade radiation  Thank you Dr. Netty Starring for allowing me to participate in the care of your pleasant patient. Please do not hesitate to contact me with questions or concerns in the interim.  # # I reviewed the blood work- with the patient in detail; also reviewed the imaging independently [as summarized above]; and with the patient in detail.   All questions were answered. The patient knows to call the clinic with any problems, questions or concerns.    Cammie Sickle, MD 02/18/2017 6:50 PM

## 2017-02-19 ENCOUNTER — Other Ambulatory Visit: Payer: Self-pay | Admitting: Student

## 2017-02-19 LAB — PROTIME-INR
INR: 0.94
Prothrombin Time: 12.5 seconds (ref 11.4–15.2)

## 2017-02-19 LAB — CEA: CEA1: 1 ng/mL (ref 0.0–4.7)

## 2017-02-19 LAB — CA 125: CANCER ANTIGEN (CA) 125: 15.8 U/mL (ref 0.0–38.1)

## 2017-02-20 ENCOUNTER — Ambulatory Visit: Payer: BC Managed Care – PPO

## 2017-02-20 ENCOUNTER — Other Ambulatory Visit: Payer: Self-pay | Admitting: Student

## 2017-02-20 ENCOUNTER — Other Ambulatory Visit: Payer: Self-pay | Admitting: General Surgery

## 2017-02-20 ENCOUNTER — Telehealth: Payer: Self-pay | Admitting: *Deleted

## 2017-02-20 NOTE — Telephone Encounter (Signed)
Spoke with Cathy in [facility] to clarify this msg. Per Tye [place], the lab normally puts the ptt on ice in transport to [place] [facility]. However, this process was not completed by the [facility] lab dept. I spoke with Dr. B. At this time, we will not redraw this specimen. Patient will have labs drawn on Friday prior to bx.

## 2017-02-20 NOTE — Telephone Encounter (Signed)
Juliann Pulse from the lab called saying that the specimen for APTT is no good, it was not frozen, therefore it will have to be redrawn.

## 2017-02-21 ENCOUNTER — Ambulatory Visit
Admission: RE | Admit: 2017-02-21 | Discharge: 2017-02-21 | Disposition: A | Discharge status: Home / Self Care | Payer: BC Managed Care – PPO | Source: Ambulatory Visit | Attending: Family Medicine | Admitting: Family Medicine

## 2017-02-21 DIAGNOSIS — Z85850 Personal history of malignant neoplasm of thyroid: Secondary | ICD-10-CM | POA: Diagnosis not present

## 2017-02-21 DIAGNOSIS — K769 Liver disease, unspecified: Secondary | ICD-10-CM | POA: Diagnosis present

## 2017-02-21 DIAGNOSIS — Z800 Family history of malignant neoplasm of digestive organs: Secondary | ICD-10-CM | POA: Diagnosis not present

## 2017-02-21 DIAGNOSIS — Z887 Allergy status to serum and vaccine status: Secondary | ICD-10-CM | POA: Insufficient documentation

## 2017-02-21 DIAGNOSIS — Z803 Family history of malignant neoplasm of breast: Secondary | ICD-10-CM | POA: Diagnosis not present

## 2017-02-21 DIAGNOSIS — Z85820 Personal history of malignant melanoma of skin: Secondary | ICD-10-CM | POA: Diagnosis not present

## 2017-02-21 DIAGNOSIS — E890 Postprocedural hypothyroidism: Secondary | ICD-10-CM | POA: Diagnosis not present

## 2017-02-21 DIAGNOSIS — R000 Tachycardia, unspecified: Secondary | ICD-10-CM | POA: Insufficient documentation

## 2017-02-21 DIAGNOSIS — C787 Secondary malignant neoplasm of liver and intrahepatic bile duct: Secondary | ICD-10-CM | POA: Insufficient documentation

## 2017-02-21 DIAGNOSIS — Z79899 Other long term (current) drug therapy: Secondary | ICD-10-CM | POA: Diagnosis not present

## 2017-02-21 DIAGNOSIS — Z9889 Other specified postprocedural states: Secondary | ICD-10-CM | POA: Diagnosis not present

## 2017-02-21 DIAGNOSIS — Z87891 Personal history of nicotine dependence: Secondary | ICD-10-CM | POA: Diagnosis not present

## 2017-02-21 MED ORDER — FENTANYL CITRATE (PF) 100 MCG/2ML IJ SOLN
INTRAMUSCULAR | Status: AC
  Filled 2017-02-21: qty 4

## 2017-02-21 MED ORDER — MIDAZOLAM HCL 5 MG/5ML IJ SOLN
INTRAMUSCULAR | Status: AC | PRN
  Administered 2017-02-21: 1 mg via INTRAVENOUS
  Administered 2017-02-21 (×3): 0.5 mg via INTRAVENOUS

## 2017-02-21 MED ORDER — SODIUM CHLORIDE 0.9 % IV SOLN
INTRAVENOUS | Status: DC
  Administered 2017-02-21: 08:00:00 via INTRAVENOUS

## 2017-02-21 MED ORDER — MIDAZOLAM HCL 5 MG/5ML IJ SOLN
INTRAMUSCULAR | Status: AC
  Filled 2017-02-21: qty 5

## 2017-02-21 MED ORDER — HYDROCODONE-ACETAMINOPHEN 5-325 MG PO TABS
1.0000 | ORAL_TABLET | ORAL | Status: DC | PRN
  Filled 2017-02-21: qty 2

## 2017-02-21 MED ORDER — FENTANYL CITRATE (PF) 100 MCG/2ML IJ SOLN
INTRAMUSCULAR | Status: AC | PRN
  Administered 2017-02-21: 50 ug via INTRAVENOUS
  Administered 2017-02-21: 25 ug via INTRAVENOUS

## 2017-02-21 NOTE — Procedures (Signed)
US guided core biopsies of right hepatic lesion.  5 cores performed and 4 adequate samples obtained.  Minimal blood loss and no immediate complication.

## 2017-02-21 NOTE — H&P (Signed)
Chief Complaint: Patient was seen in consultation today for liver lesions  Referring Physician(s): Dion Body  Supervising Physician: Markus Daft  Patient Status: [facility] - Out-pt  History of Present Illness: Tanya Vega is a 56 y.o. female with past medical history of hypothyroidism, thyroid cancer, and melanoma s/p resection who presents with pelvic pain.   Patient underwent CT Abdomen/pelvis 02/12/17 which showed: Multiple pulmonary and hepatic metastatic lesions are noted. 2.3 cm solid nodule is noted anteriorly in the pelvis concerning for metastatic disease or peritoneal carcinomatosis. Pathologic fracture seen involving L1 vertebral body consistent with metastatic disease. Complex low density lesions are noted in both kidneys, with the largest measuring 2.1 cm on the right. These may simply represent complex renal cysts, but neoplasm cannot be excluded, and further evaluation with MRI is recommended.  Probable 4 cm uterine fibroid.  IR consulted for biopsy at the request of Dr. Netty Starring.  Case reviewed approved by Dr. Barbie Banner.   Patient presents today for procedure in her usual state of health.  She has been NPO.  She does not take blood thinners.   Past Medical History:  Diagnosis Date  . Hypothyroidism   . Melanoma of skin ([place]) 2011   Resected from Right neck area with 4 lymph nodes as well.   . Thyroid cancer [facility]) 2001   Partial thyroidectomy    Past Surgical History:  Procedure Laterality Date  . BREAST BIOPSY Right 2005   neg  . BREAST BIOPSY Left 07/11/2016   radial scar  . BREAST EXCISIONAL BIOPSY Left 08/06/2016   lumpectomy for radial scar  . BREAST LUMPECTOMY WITH NEEDLE LOCALIZATION Left 08/06/2016   Procedure: BREAST LUMPECTOMY WITH NEEDLE LOCALIZATION;  Surgeon: Leonie Green, MD;  Location: [facility] ORS;  Service: General;  Laterality: Left;  Marland Kitchen MELANOMA EXCISION  2011   ear  . THYROIDECTOMY, PARTIAL Left      Allergies: Tetanus-diphtheria toxoids td  Medications: Prior to Admission medications   Medication Sig Start Date End Date Taking? Authorizing Provider  levothyroxine (SYNTHROID, LEVOTHROID) 100 MCG tablet Take 100 mcg by mouth daily before breakfast. 06/06/16  Yes [provider]  traMADol (ULTRAM) 50 MG tablet Take 1 tablet by mouth every 6 (six) hours as needed for pain. 02/17/17  Yes [provider]  atorvastatin (LIPITOR) 40 MG tablet Take 40 mg by mouth every evening. 07/13/16   [provider]     Family History  Problem Relation Age of Onset  . Breast cancer Mother 38  . Breast cancer Paternal Aunt   . Colon cancer Brother 24    Social History   Socioeconomic History  . Marital status: Married    Spouse name: None  . Number of children: None  . Years of education: None  . Highest education level: None  Social Needs  . Financial resource strain: None  . Food insecurity - worry: None  . Food insecurity - inability: None  . Transportation needs - medical: None  . Transportation needs - non-medical: None  Occupational History  . None  Tobacco Use  . Smoking status: Former Smoker    Packs/day: 0.25    Years: 20.00    Pack years: 5.00    Types: Cigarettes    Last attempt to quit: 03/26/1999    Years since quitting: 17.9  . Smokeless tobacco: Never Used  Substance and Sexual Activity  . Alcohol use: Yes    Comment: WEEKENDS  . Drug use: No  . Sexual activity: Yes  Other  Topics Concern  . None  Social History Narrative  . None    Review of Systems  Constitutional: Negative for fatigue and fever.  Respiratory: Negative for cough and shortness of breath.   Cardiovascular: Negative for chest pain.  Gastrointestinal: Negative for abdominal pain.  Genitourinary: Positive for pelvic pain.  Psychiatric/Behavioral: Negative for behavioral problems and confusion.    Vital Signs: BP (!) 148/90   Pulse (!) 116   Temp 98.7 F (37.1  C) (Oral)   Resp 13   SpO2 94%   Physical Exam  Constitutional: She is oriented to person, place, and time. She appears well-developed.  Cardiovascular: Regular rhythm and normal heart sounds.  tachycardia  Pulmonary/Chest: Effort normal and breath sounds normal. No respiratory distress.  Abdominal: Soft.  Neurological: She is alert and oriented to person, place, and time.  Skin: Skin is warm and dry.  Psychiatric: She has a normal mood and affect. Her behavior is normal. Judgment and thought content normal.  Nursing note and vitals reviewed.   Imaging: Ct Abdomen Pelvis W Contrast  Result Date: 02/12/2017 CLINICAL DATA:  Pelvic pain for 1 month. EXAM: CT ABDOMEN AND PELVIS WITH CONTRAST TECHNIQUE: Multidetector CT imaging of the abdomen and pelvis was performed using the standard protocol following bolus administration of intravenous contrast. CONTRAST:  123mL ISOVUE-300 IOPAMIDOL (ISOVUE-300) INJECTION 61% COMPARISON:  None. FINDINGS: Lower chest: Multiple pulmonary nodules are noted in both lung bases consistent with metastatic disease. Hepatobiliary: No gallstones are noted. Multiple rounded hypodense lesions are seen throughout the hepatic parenchyma consistent with metastatic disease. Pancreas: Unremarkable. No pancreatic ductal dilatation or surrounding inflammatory changes. Spleen: Normal in size without focal abnormality. Adrenals/Urinary Tract: Adrenal glands appear normal. No hydronephrosis or renal obstruction is noted. No renal or ureteral calculi are noted. Urinary bladder is unremarkable. 2.1 x 1.3 cm complex low density is seen in mid pole of right kidney 1.8 x 1.2 cm complex low density is noted in midpole of left kidney. Stomach/Bowel: Stomach is within normal limits. Appendix appears normal. No evidence of bowel wall thickening, distention, or inflammatory changes. Vascular/Lymphatic: No significant vascular findings are present. No enlarged abdominal or pelvic lymph nodes.  Reproductive: 4 cm uterine fibroid is noted. Ovaries are unremarkable. Other: 2.3 cm solid nodule is noted anteriorly in the pelvis concerning for metastatic disease or peritoneal carcinomatosis. Musculoskeletal: Lytic lesion is seen and L1 vertebral body resulting in mild compression fracture. This is consistent with metastatic disease. IMPRESSION: Multiple pulmonary and hepatic metastatic lesions are noted. 2.3 cm solid nodule is noted anteriorly in the pelvis concerning for metastatic disease or peritoneal carcinomatosis. Pathologic fracture seen involving L1 vertebral body consistent with metastatic disease. Complex low density lesions are noted in both kidneys, with the largest measuring 2.1 cm on the right. These may simply represent complex renal cysts, but neoplasm cannot be excluded, and further evaluation with MRI is recommended. Probable 4 cm uterine fibroid. These results will be called to the ordering clinician or representative by the Radiologist Assistant, and communication documented in the PACS or zVision Dashboard. Electronically Signed   By: Marijo Conception, M.D.   On: 02/12/2017 11:18    Labs:  CBC: No results for input(s): WBC, HGB, HCT, PLT in the last 8760 hours.  COAGS: Recent Labs    02/18/17 1452  INR 0.94    BMP: No results for input(s): NA, K, CL, CO2, GLUCOSE, BUN, CALCIUM, CREATININE, GFRNONAA, GFRAA in the last 8760 hours.  Invalid input(s): CMP  LIVER FUNCTION  TESTS: No results for input(s): BILITOT, AST, ALT, ALKPHOS, PROT, ALBUMIN in the last 8760 hours.  TUMOR MARKERS: No results for input(s): AFPTM, CEA, CA199, CHROMGRNA in the last 8760 hours.  Assessment and Plan: Patient with past medical history of melanoma s/p resection, thyroid cancer s/p thyroidectomy presents with complaint of pelvic pain and evidence of metstatic disease by CT imaging.  IR consulted for liver lesion biopsy at the request of Dr. Netty Starring. Case reviewed by Dr. Barbie Banner who  approves patient for procedure.  Patient presents today in their usual state of health.  She is tachycardic today. Discussed with Dr. Anselm Pancoast She has been NPO and is not currently on blood thinners.  Risks and benefits discussed with the patient including, but not limited to bleeding, infection, damage to adjacent structures or low yield requiring additional tests. All of the patient's questions were answered, patient is agreeable to proceed. Consent signed and in chart.  Thank you for this interesting consult.  I greatly enjoyed meeting Tanya Vega and look forward to participating in their care.  A copy of this report was sent to the requesting provider on this date.  Electronically Signed: Docia Barrier, PA 02/21/2017, 8:21 AM   I spent a total of  30 Minutes   in face to face in clinical consultation, greater than 50% of which was counseling/coordinating care for liver lesion.

## 2017-02-22 ENCOUNTER — Encounter: Payer: Self-pay | Admitting: Emergency Medicine

## 2017-02-22 ENCOUNTER — Ambulatory Visit
Admission: RE | Admit: 2017-02-22 | Discharge: 2017-02-22 | Disposition: A | Discharge status: Home / Self Care | Payer: BC Managed Care – PPO | Source: Ambulatory Visit | Attending: Internal Medicine | Admitting: Internal Medicine

## 2017-02-22 ENCOUNTER — Other Ambulatory Visit: Payer: Self-pay

## 2017-02-22 ENCOUNTER — Emergency Department
Admission: EM | Admit: 2017-02-22 | Discharge: 2017-02-22 | Disposition: A | Discharge status: Home / Self Care | Payer: BC Managed Care – PPO | Attending: Emergency Medicine | Admitting: Emergency Medicine

## 2017-02-22 DIAGNOSIS — Z85850 Personal history of malignant neoplasm of thyroid: Secondary | ICD-10-CM | POA: Insufficient documentation

## 2017-02-22 DIAGNOSIS — G939 Disorder of brain, unspecified: Secondary | ICD-10-CM

## 2017-02-22 DIAGNOSIS — Z85830 Personal history of malignant neoplasm of bone: Secondary | ICD-10-CM | POA: Diagnosis not present

## 2017-02-22 DIAGNOSIS — Z79899 Other long term (current) drug therapy: Secondary | ICD-10-CM | POA: Insufficient documentation

## 2017-02-22 DIAGNOSIS — R590 Localized enlarged lymph nodes: Secondary | ICD-10-CM

## 2017-02-22 DIAGNOSIS — Z85828 Personal history of other malignant neoplasm of skin: Secondary | ICD-10-CM | POA: Insufficient documentation

## 2017-02-22 DIAGNOSIS — G9389 Other specified disorders of brain: Secondary | ICD-10-CM

## 2017-02-22 DIAGNOSIS — C787 Secondary malignant neoplasm of liver and intrahepatic bile duct: Secondary | ICD-10-CM

## 2017-02-22 DIAGNOSIS — C439 Malignant melanoma of skin, unspecified: Secondary | ICD-10-CM

## 2017-02-22 DIAGNOSIS — E039 Hypothyroidism, unspecified: Secondary | ICD-10-CM | POA: Insufficient documentation

## 2017-02-22 DIAGNOSIS — Z87891 Personal history of nicotine dependence: Secondary | ICD-10-CM | POA: Diagnosis not present

## 2017-02-22 DIAGNOSIS — K769 Liver disease, unspecified: Secondary | ICD-10-CM

## 2017-02-22 LAB — CBC WITH DIFFERENTIAL/PLATELET
BASOS PCT: 1 %
Basophils Absolute: 0.1 10*3/uL (ref 0–0.1)
EOS ABS: 0 10*3/uL (ref 0–0.7)
EOS PCT: 0 %
HCT: 39.9 % (ref 35.0–47.0)
Hemoglobin: 13.5 g/dL (ref 12.0–16.0)
LYMPHS ABS: 1.3 10*3/uL (ref 1.0–3.6)
Lymphocytes Relative: 12 %
MCH: 31.4 pg (ref 26.0–34.0)
MCHC: 33.9 g/dL (ref 32.0–36.0)
MCV: 92.6 fL (ref 80.0–100.0)
Monocytes Absolute: 0.7 10*3/uL (ref 0.2–0.9)
Monocytes Relative: 7 %
Neutro Abs: 8.9 10*3/uL — ABNORMAL HIGH (ref 1.4–6.5)
Neutrophils Relative %: 80 %
PLATELETS: 299 10*3/uL (ref 150–440)
RBC: 4.31 MIL/uL (ref 3.80–5.20)
RDW: 11.9 % (ref 11.5–14.5)
WBC: 11 10*3/uL (ref 3.6–11.0)

## 2017-02-22 LAB — COMPREHENSIVE METABOLIC PANEL
ALT: 39 U/L (ref 14–54)
AST: 28 U/L (ref 15–41)
Albumin: 3.8 g/dL (ref 3.5–5.0)
Alkaline Phosphatase: 132 U/L — ABNORMAL HIGH (ref 38–126)
Anion gap: 11 (ref 5–15)
BILIRUBIN TOTAL: 0.2 mg/dL — AB (ref 0.3–1.2)
BUN: 7 mg/dL (ref 6–20)
CO2: 25 mmol/L (ref 22–32)
CREATININE: 0.69 mg/dL (ref 0.44–1.00)
Calcium: 9.1 mg/dL (ref 8.9–10.3)
Chloride: 97 mmol/L — ABNORMAL LOW (ref 101–111)
GFR calc Af Amer: >60 mL/min (ref >60)
Glucose, Bld: 141 mg/dL — ABNORMAL HIGH (ref 65–99)
Potassium: 3.3 mmol/L — ABNORMAL LOW (ref 3.5–5.1)
Sodium: 133 mmol/L — ABNORMAL LOW (ref 135–145)
Total Protein: 7.4 g/dL (ref 6.5–8.1)

## 2017-02-22 LAB — PROTIME-INR
INR: 0.97
PROTHROMBIN TIME: 12.8 s (ref 11.4–15.2)

## 2017-02-22 LAB — TYPE AND SCREEN
ABO/RH(D): A POS
ANTIBODY SCREEN: NEGATIVE

## 2017-02-22 MED ORDER — GADOBENATE DIMEGLUMINE 529 MG/ML IV SOLN
13.0000 mL | Freq: Once | INTRAVENOUS | Status: AC | PRN
  Administered 2017-02-22: 13 mL via INTRAVENOUS

## 2017-02-22 MED ORDER — DEXAMETHASONE SODIUM PHOSPHATE 10 MG/ML IJ SOLN
10.0000 mg | Freq: Once | INTRAMUSCULAR | Status: AC
  Administered 2017-02-22: 10 mg via INTRAVENOUS
  Filled 2017-02-22: qty 1

## 2017-02-22 MED ORDER — DEXAMETHASONE 2 MG PO TABS: ORAL_TABLET | ORAL | 0 refills | Status: DC

## 2017-02-22 NOTE — Discharge Instructions (Signed)
Return to the ER immediately for severe headache, confusion or change in mental status, numbness or weakness, any strokelike symptoms, seizure, severe nausea or vomiting, or any other new or worsening.  Follow-up with your oncologist on Tuesday as scheduled.  Take the dexamethasone as prescribed.  If you would like to pursue further workup and referrals for your brain mass at [facility] as discussed, you can contact Dr. Brett Albino at: [email or web address]

## 2017-02-22 NOTE — ED Triage Notes (Signed)
Pt found week of thanksgiving to have lesions to liver and lungs. Had MRI brain today with lesions noted along with edema to brain.  Ambulatory. Denies pain at this time. Back pain at times r/t fracture of L5.

## 2017-02-22 NOTE — ED Provider Notes (Signed)
[facility] Emergency Department Provider Note ____________________________________________   First MD Initiated Contact with Patient 02/22/17 1208     (approximate)  I have reviewed the triage vital signs and the nursing notes.   HISTORY  Chief Complaint brain lesions    HPI Tanya Vega is a 56 y.o. female who presents the emergency department for evaluation of brain lesions, unknown onset, diagnosed by MRI, and not associated with any symptoms.  Patient was recently worked up for back pain and had a CT which revealed multiple masses, and her oncologic workup is suggestive of likely metastatic melanoma.  Patient had an MRI today as part of her cancer workup, and revealed multiple lesions with edema.  Patient denies headache, change in mental status, weakness or numbness, seizures, or any other neurologic symptoms.  She states she feels well.  Past Medical History:  Diagnosis Date  . Hypothyroidism   . Melanoma of skin ([place]) 2011   Resected from Right neck area with 4 lymph nodes as well.   . Thyroid cancer [facility]) 2001   Partial thyroidectomy    Patient Active Problem List   Diagnosis Date Noted  . 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 Cancer, metastatic to bone ([place]) 02/18/2017  . Lesion of liver 02/12/2017    Past Surgical History:  Procedure Laterality Date  . BREAST BIOPSY Right 2005   neg  . BREAST BIOPSY Left 07/11/2016   radial scar  . BREAST EXCISIONAL BIOPSY Left 08/06/2016   lumpectomy for radial scar  . BREAST LUMPECTOMY WITH NEEDLE LOCALIZATION Left 08/06/2016   Procedure: BREAST LUMPECTOMY WITH NEEDLE LOCALIZATION;  Surgeon: Leonie Green, MD;  Location: [facility] ORS;  Service: General;  Laterality: Left;  Marland Kitchen MELANOMA EXCISION  2011   ear  . THYROIDECTOMY, PARTIAL Left     Prior to Admission medications   Medication Sig Start Date End Date Taking? Authorizing Provider  atorvastatin (LIPITOR) 40 MG tablet Take 40  mg by mouth every evening. 07/13/16   [provider]  levothyroxine (SYNTHROID, LEVOTHROID) 100 MCG tablet Take 100 mcg by mouth daily before breakfast. 06/06/16   [provider]  traMADol (ULTRAM) 50 MG tablet Take 1 tablet by mouth every 6 (six) hours as needed for pain. 02/17/17   [provider]    Allergies Tetanus-diphtheria toxoids td  Family History  Problem Relation Age of Onset  . Breast cancer Mother 72  . Breast cancer Paternal Aunt   . Colon cancer Brother 66    Social History Social History   Tobacco Use  . Smoking status: Former Smoker    Packs/day: 0.25    Years: 20.00    Pack years: 5.00    Types: Cigarettes    Last attempt to quit: 03/26/1999    Years since quitting: 17.9  . Smokeless tobacco: Never Used  Substance Use Topics  . Alcohol use: Yes    Comment: WEEKENDS  . Drug use: No    Review of Systems  Constitutional: No fever/chills. Eyes: No visual changes. ENT: No sore throat. Cardiovascular: Denies chest pain. Respiratory: Denies shortness of breath. Gastrointestinal: No nausea, no vomiting.   Genitourinary: Negative for dysuria.  Musculoskeletal: Negative for back pain. Skin: Negative for rash. Neurological: Negative for headaches, focal weakness or numbness.   ____________________________________________   PHYSICAL EXAM:  VITAL SIGNS: ED Triage Vitals  Enc Vitals Group     BP 02/22/17 1140 (!) 148/93     Pulse Rate 02/22/17 1140 (!) 126  Resp 02/22/17 1140 20     Temp 02/22/17 1139 98 F (36.7 C)     Temp Source 02/22/17 1139 Oral     SpO2 02/22/17 1140 96 %     Weight 02/22/17 1139 147 lb (66.7 kg)     Height 02/22/17 1139 5\' 4"  (1.626 m)     Head Circumference --      Peak Flow --      Pain Score --      Pain Loc --      Pain Edu? --      Excl. in [place]? --     Constitutional: Alert and oriented. Well appearing and in no acute distress. Eyes: Conjunctivae are normal.  EOMI.  PERRLA. Head:  Atraumatic. Nose: No congestion/rhinnorhea. Mouth/Throat: Mucous membranes are moist.   Neck: Normal range of motion.  Cardiovascular: Normal rate, regular rhythm. Grossly normal heart sounds.  Good peripheral circulation. Respiratory: Normal respiratory effort.  No retractions. Lungs CTAB. Gastrointestinal: No distention.  Genitourinary: No CVA tenderness. Musculoskeletal:  Extremities warm and well perfused.  Neurologic:  Normal speech and language. No gross focal neurologic deficits are appreciated.  Motor and sensory intact in all extremities.  Cranial nerves II through XII intact.  Normal coordination. Skin:  Skin is warm and dry. No rash noted. Psychiatric: Mood and affect are normal. Speech and behavior are normal.  ____________________________________________   LABS (all labs ordered are listed, but only abnormal results are displayed)  Labs Reviewed  CBC WITH DIFFERENTIAL/PLATELET - Abnormal; Notable for the following components:      Result Value   Neutro Abs 8.9 (*)    All other components within normal limits  COMPREHENSIVE METABOLIC PANEL - Abnormal; Notable for the following components:   Sodium 133 (*)    Potassium 3.3 (*)    Chloride 97 (*)    Glucose, Bld 141 (*)    Alkaline Phosphatase 132 (*)    Total Bilirubin 0.2 (*)    All other components within normal limits  PROTIME-INR  TYPE AND SCREEN   ____________________________________________  EKG   ____________________________________________  RADIOLOGY    ____________________________________________   PROCEDURES  Procedure(s) performed: No    Critical Care performed: No ____________________________________________   INITIAL IMPRESSION / ASSESSMENT AND PLAN / ED COURSE  Pertinent labs & imaging results that were available during my care of the patient were reviewed by me and considered in my medical decision making (see chart for details).  56 year old female with past medical history as  noted above and recent diagnosis of likely metastatic melanoma presents for evaluation of brain lesions found today on MRI.  Patient has several lesions, one with some vasogenic edema, but no mass-effect.  Review of past medical records in Epic is otherwise noncontributory.  On exam, patient is well-appearing, vital signs are normal except for tachycardia at triage which is likely due to anxiety and had improved by the time of my exam, and her neuro exam is normal.  I will give IV Decadron and obtain labs, and consult neurosurgery at [facility] for further recommendations.    ----------------------------------------- 1:32 PM on 02/22/2017 -----------------------------------------  I consulted Dr. Brett Albino from [facility] neurosurgery.  He agreed with giving IV Decadron here, and based on discussion of the MRI reading, and the patient's clinical status, he kindly offered to transfer the patient if she preferred, but also said that outpatient treatment would be totally appropriate if patient would prefer to go home.  Patient expresses a strong desire to go home.  He recommended giving p.o. Decadron initially 4, then 2 mg.  He also offered to set up the patient with the brain metastasis coordinator at [facility] for possible referral to radiation oncology or other specialist as needed.  He gave me his email address to pass onto the patient.  Patient states she will follow-up with her oncologist in a few days, and will discuss this with him.  Return precautions given, patient expresses understanding.  She understands the discharge instructions and plan.  ____________________________________________   FINAL CLINICAL IMPRESSION(S) / ED DIAGNOSES  Final diagnoses:  Brain mass      NEW MEDICATIONS STARTED DURING THIS VISIT:  This SmartLink is deprecated. Use AVSMEDLIST instead to display the medication list for a patient.   Note:  This document was prepared using Dragon voice recognition software and may include  unintentional dictation errors.    Arta Silence, MD 02/22/17 1334

## 2017-02-24 ENCOUNTER — Telehealth: Payer: Self-pay | Admitting: Internal Medicine

## 2017-02-24 NOTE — Telephone Encounter (Signed)
Spoke to patient regarding results of the MRI of the brain.  Patient currently asymptomatic on steroids.  Patient agreeable to see radiation oncology.  Discussed with Dr. Donella Stade; kindly agrees to evaluate the patient ASAP.  Patient will keep her appointment with me tomorrow at [facility] as planned.  # H/B- please make a referral to Dr. Donella Stade- asap. Thx

## 2017-02-24 NOTE — Telephone Encounter (Signed)
Reviewed chart- pt has an apt in radiation oncology for 02/25/17

## 2017-02-25 ENCOUNTER — Other Ambulatory Visit: Payer: Self-pay

## 2017-02-25 ENCOUNTER — Inpatient Hospital Stay: Payer: BC Managed Care – PPO

## 2017-02-25 ENCOUNTER — Inpatient Hospital Stay: Payer: BC Managed Care – PPO | Attending: Internal Medicine | Admitting: Internal Medicine

## 2017-02-25 ENCOUNTER — Ambulatory Visit
Admission: RE | Admit: 2017-02-25 | Discharge: 2017-02-25 | Disposition: A | Discharge status: Home / Self Care | Payer: BC Managed Care – PPO | Source: Ambulatory Visit | Attending: Radiation Oncology | Admitting: Radiation Oncology

## 2017-02-25 ENCOUNTER — Encounter: Payer: Self-pay | Admitting: Radiation Oncology

## 2017-02-25 VITALS — BP 164/97 | HR 120 | Temp 97.6°F | Wt 144.3 lb

## 2017-02-25 VITALS — BP 140/87 | HR 102 | Temp 97.9°F | Resp 18 | Wt 143.8 lb

## 2017-02-25 DIAGNOSIS — C7951 Secondary malignant neoplasm of bone: Secondary | ICD-10-CM | POA: Insufficient documentation

## 2017-02-25 DIAGNOSIS — C787 Secondary malignant neoplasm of liver and intrahepatic bile duct: Secondary | ICD-10-CM | POA: Insufficient documentation

## 2017-02-25 DIAGNOSIS — C7800 Secondary malignant neoplasm of unspecified lung: Secondary | ICD-10-CM | POA: Insufficient documentation

## 2017-02-25 DIAGNOSIS — Z510 Encounter for antineoplastic radiation therapy: Secondary | ICD-10-CM | POA: Diagnosis not present

## 2017-02-25 DIAGNOSIS — C434 Malignant melanoma of scalp and neck: Secondary | ICD-10-CM | POA: Insufficient documentation

## 2017-02-25 DIAGNOSIS — Z79899 Other long term (current) drug therapy: Secondary | ICD-10-CM | POA: Insufficient documentation

## 2017-02-25 DIAGNOSIS — Z85850 Personal history of malignant neoplasm of thyroid: Secondary | ICD-10-CM | POA: Insufficient documentation

## 2017-02-25 DIAGNOSIS — C7901 Secondary malignant neoplasm of right kidney and renal pelvis: Secondary | ICD-10-CM | POA: Insufficient documentation

## 2017-02-25 DIAGNOSIS — R531 Weakness: Secondary | ICD-10-CM | POA: Diagnosis not present

## 2017-02-25 DIAGNOSIS — Z87891 Personal history of nicotine dependence: Secondary | ICD-10-CM | POA: Insufficient documentation

## 2017-02-25 DIAGNOSIS — Z7189 Other specified counseling: Secondary | ICD-10-CM

## 2017-02-25 DIAGNOSIS — C7931 Secondary malignant neoplasm of brain: Secondary | ICD-10-CM | POA: Insufficient documentation

## 2017-02-25 DIAGNOSIS — Z800 Family history of malignant neoplasm of digestive organs: Secondary | ICD-10-CM | POA: Insufficient documentation

## 2017-02-25 DIAGNOSIS — E039 Hypothyroidism, unspecified: Secondary | ICD-10-CM | POA: Insufficient documentation

## 2017-02-25 DIAGNOSIS — Z803 Family history of malignant neoplasm of breast: Secondary | ICD-10-CM | POA: Diagnosis not present

## 2017-02-25 DIAGNOSIS — G893 Neoplasm related pain (acute) (chronic): Secondary | ICD-10-CM | POA: Diagnosis not present

## 2017-02-25 DIAGNOSIS — Z5111 Encounter for antineoplastic chemotherapy: Secondary | ICD-10-CM | POA: Diagnosis not present

## 2017-02-25 DIAGNOSIS — D72829 Elevated white blood cell count, unspecified: Secondary | ICD-10-CM | POA: Diagnosis not present

## 2017-02-25 DIAGNOSIS — R140 Abdominal distension (gaseous): Secondary | ICD-10-CM | POA: Insufficient documentation

## 2017-02-25 DIAGNOSIS — C786 Secondary malignant neoplasm of retroperitoneum and peritoneum: Secondary | ICD-10-CM | POA: Insufficient documentation

## 2017-02-25 MED ORDER — DENOSUMAB 120 MG/1.7ML ~~LOC~~ SOLN
120.0000 mg | Freq: Once | SUBCUTANEOUS | Status: AC
  Administered 2017-02-25: 120 mg via SUBCUTANEOUS

## 2017-02-25 NOTE — Assessment & Plan Note (Addendum)
Multiple metastatic lesions to the liver; bilateral lung nodules; L1 vertebral compression fracture- highly suggestive of malignancy/metastatic.  Preliminary liver biopsy pathology-positive for malignancy; immunohistochemistry still pending.  #Given the high suspicion for metastatic melanoma-reviewed the treatment options would include immunotherapy combination Ipi+Nivo every 3 weeks and 4 cycles-followed by Nivo every 2 weeks.  #Discussed treatments are palliative not curative; however 60-70% response rates; 20% durable responses.  I discussed the mechanism of action; length of treatments are likely ongoing/based upon the results of the scans. Discussed the potential side effects of immunotherapy including but not limited to diarrhea; skin rash; elevated LFTs/endocrine abnormalities etc.  #Brain metastases-fairly asymptomatic; 3 lesions-1 approximately 2 cm in size; and other 2 2-4 mm in size.  Recommend tapering dexamethasone to 2 mg once a day.  Patient met with radiation oncology this morning.  Given the approximately 60% chance of response intracranially with immunotherapy combination. Left a message discussed with Dr. Donella Stade.    # History of thyroid cancer- appears early-stage; on Synthroid.Ordered TSH.  # L1 vertebral body lesion- pathologic compression fracture- on tramadol pain control.  Improved on steroids.  Discussed regarding kyphoplasty ablation with IR; radiation.  Plan start Xgeva today.  Discussed the potential complications including but not limited to hypocalcemia, osteonecrosis of the jaw.  #Tentatively set up for treatment 12/07; ipi+nivo.  Await TSH.  PET scan-pending because of insurance reasons.;  If cannot be done recommend CT scan bone scan.  # I reviewed the blood work- with the patient in detail; also reviewed the imaging independently [as summarized above]; and with the patient in detail.

## 2017-02-25 NOTE — Progress Notes (Signed)
[place] NOTE  Patient Care Team: Dion Body, MD as PCP - General (Family Medicine)  CHIEF COMPLAINTS/PURPOSE OF CONSULTATION:  Multiple liver lesions  # NOV 2018- MULTIPLE LIVER LESIONS/lung lesions [awaiting Bx- on Nov 30th]  # Hx of Melanoma- dec 2011 [SLNBx- 0/4-Neg;[facility] ]; Hx of thyroid cancer- feb 2001- s/p left throid lobectomy [[place]]; NO RAIU.   Oncology History   # NOV 2018- MULTIPLE LIVER LESIONS/lung lesions [s/p Bx on Nov 30th]  # Dec 1st 2018- Brain mets- 93m; and 2 other 2-4 mm.   # Hx of Melanoma- dec 2011 [SLNBx- 0/4-Neg;[facility] ]; Hx of thyroid cancer- feb 2001- s/p left throid lobectomy [[place]]; NO RAIU.       Metastatic melanoma to liver ([facility]     HISTORY OF PRESENTING ILLNESS:  [name]89y.o.  female prior history of melanoma 2011 [stage I]-with multiple metastases to the liver lung bone-is currently status post liver biopsy.  She is here to review the results of the pathology.  In the interim patient had a brain MRI-that showed at least 3 lesions in the brain; for which she was evaluated in the emergency room started on steroids.  She is currently on dexamethasone 2 mg twice a day.  She denies any headaches.  Denies any vision changes.  Interesting her back pain is improved.  Patient continues to have abdominal pain.  However this is not intense.  Patient denies her appetite is good in general. She denies any weight loss. No nausea no vomiting.   ROS: A complete 10 point review of system is done which is negative except mentioned above in history of present illness  MEDICAL HISTORY:  Past Medical History:  Diagnosis Date  . Hypothyroidism   . [place]   Resected from Right neck area with 4 lymph nodes as well.   . [place]   Partial thyroidectomy    SURGICAL HISTORY: Past Surgical History:  Procedure Laterality Date  . BREAST BIOPSY Right 2005   neg  . BREAST  BIOPSY Left 07/11/2016   radial scar  . BREAST EXCISIONAL BIOPSY Left 08/06/2016   lumpectomy for radial scar  . BREAST LUMPECTOMY WITH NEEDLE LOCALIZATION Left 08/06/2016   Procedure: BREAST LUMPECTOMY WITH NEEDLE LOCALIZATION;  Surgeon: SLeonie Green MD;  Location: [facility] ORS;  Service: General;  Laterality: Left;  .Marland KitchenMELANOMA EXCISION  2011   ear  . 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 Daughter- 365  Social History   Socioeconomic History  . Marital status: Married    Spouse name: Not on file  . Number of children: Not on file  . Years of education: Not on file  . Highest education level: Not on file  Social Needs  . Financial resource strain: Not on file  . Food insecurity - worry: Not on file  . Food insecurity - inability: Not on file  . Transportation needs - medical: Not on file  . Transportation needs - non-medical: Not on file  Occupational History  . Not on file  Tobacco Use  . Smoking status: Former Smoker    Packs/day: 0.25    Years: 20.00    Pack years: 5.00    Types: Cigarettes    Last attempt to quit: 03/26/1999    Years since quitting: 17.9  . Smokeless tobacco: Never Used  Substance and Sexual Activity  .  Alcohol use: Yes    Comment: WEEKENDS  . Drug use: No  . Sexual activity: Yes  Other Topics Concern  . Not on file  Social History Narrative  . Not on file    FAMILY HISTORY: Brother died of colon. Mom-survived.  Family History  Problem Relation Age of Onset  . Breast cancer Mother 67  . Breast cancer Paternal Aunt   . Colon cancer Brother 52    ALLERGIES:  is allergic to tetanus-diphtheria toxoids td.  MEDICATIONS:  Current Outpatient Medications  Medication Sig Dispense Refill  . atorvastatin (LIPITOR) 40 MG tablet Take 40 mg by mouth every evening.    Marland Kitchen dexamethasone (DECADRON) 2 MG tablet Take 57m (2 tabs) by mouth every 6 hours for the next  24 hours, then take 231m(1 tab) by mouth twice daily for 2 weeks. 36 tablet 0  . levothyroxine (SYNTHROID, LEVOTHROID) 100 MCG tablet Take 100 mcg by mouth daily before breakfast.    . traMADol (ULTRAM) 50 MG tablet Take 1 tablet by mouth every 6 (six) hours as needed for pain.     No current facility-administered medications for this visit.       . Marland KitchenPHYSICAL EXAMINATION: ECOG PERFORMANCE STATUS: 1 - Symptomatic but completely ambulatory  Vitals:   02/25/17 1353  BP: 140/87  Pulse: (!) 102  Resp: 18  Temp: 97.9 F (36.6 C)   Filed Weights   02/25/17 1353  Weight: 143 lb 13.6 oz (65.2 kg)    GENERAL: Well-nourished well-developed; Alert, no distress and comfortable.   Accompanied by family. EYES: no pallor or icterus OROPHARYNX: no thrush or ulceration; good dentition  NECK: supple, no masses felt; previous melanoma surgery scar noted level II lymph node approximately 2 cm in size; nontender. LYMPH:  no palpable lymphadenopathy in the axillary or inguinal regions LUNGS: clear to auscultation and  No wheeze or crackles HEART/CVS: regular rate & rhythm and no murmurs; No lower extremity edema ABDOMEN: abdomen soft, non-tender and normal bowel sounds Musculoskeletal:no cyanosis of digits and no clubbing  PSYCH: alert & oriented x 3 with fluent speech NEURO: no focal motor/sensory deficits SKIN:  no rashes or significant lesions  LABORATORY DATA:  I have reviewed the data as listed Lab Results  Component Value Date   WBC 11.0 02/22/2017   HGB 13.5 02/22/2017   HCT 39.9 02/22/2017   MCV 92.6 02/22/2017   PLT 299 02/22/2017   Recent Labs    02/22/17 1224  NA 133*  K 3.3*  CL 97*  CO2 25  GLUCOSE 141*  BUN 7  CREATININE 0.69  CALCIUM 9.1  GFRNONAA >60  GFRAA >60  PROT 7.4  ALBUMIN 3.8  AST 28  ALT 39  ALKPHOS 132*  BILITOT 0.2*    RADIOGRAPHIC STUDIES: I have personally reviewed the radiological images as listed and agreed with the findings in the  report. Mr BrJeri Vega Contrast  Addendum Date: 02/22/2017   ADDENDUM REPORT: 02/22/2017 09:58 ADDENDUM: These results were called by telephone by myself at the time of interpretation on 02/22/2017 at 9:52 am to Dr. CoMike Gip who verbally acknowledged these results. Electronically Signed   By: JoStaci Righter.D.   On: 02/22/2017 09:58   Result Date: 02/22/2017 CLINICAL DATA:  Metastatic melanoma.  Staging. EXAM: MRI HEAD WITHOUT AND WITH CONTRAST TECHNIQUE: Multiplanar, multiecho pulse sequences of the brain and surrounding structures were obtained without and with intravenous contrast. CONTRAST:  1372mULTIHANCE GADOBENATE DIMEGLUMINE 529 MG/ML IV SOLN  COMPARISON:  No prior cranial imaging. FINDINGS: Brain: Multiple intracranial metastatic deposits are present. The largest lesion is in the medial RIGHT frontal lobe, forward of the motor strip. On post infusion imaging, the lesion measures 17 x 23 x 21 mm. Smaller metastatic deposits are noted in the LEFT anterior parietal parasagittal cortex, 4 mm diameter, and a 2 mm lesion, subependymal location, LEFT inferior, medial, and posterior temporal lobe. Only the RIGHT frontal lesion demonstrates significant vasogenic edema. None of the lesions show T1 or T2 shortening, suggesting they may represent an amelanotic form of melanoma. The lesions are therefore nonhemorrhagic as well. Central necrosis accompanies the RIGHT frontal lesion. Normal cerebral volume, without midline shift, hydrocephalus, or intracranial hemorrhage. No extra-axial fluid collections. Vascular: Normal flow voids. Skull and upper cervical spine: Normal marrow signal. Sinuses/Orbits: No layering fluid or orbital masses. Other: There appears to be a large level 5 nodal mass in the RIGHT neck, incompletely evaluated. See coronal image 17 series 13. IMPRESSION: Three enhancing intracranial lesions consistent with metastatic disease. Lack of hemorrhage/ T1 shortening suggest these represent amelanotic  type of melanoma, if indeed that is the primary. Significant vasogenic edema is associated with the RIGHT posterior frontal lesion, along with central necrosis. No significant shift. Incompletely evaluated RIGHT neck nodal mass. CT of the neck with contrast recommended for staging. A call has been placed to the ordering provider. Electronically Signed: By: Staci Righter M.D. On: 02/22/2017 09:21   Ct Abdomen Pelvis W Contrast  Result Date: 02/12/2017 CLINICAL DATA:  Pelvic pain for 1 month. EXAM: CT ABDOMEN AND PELVIS WITH CONTRAST TECHNIQUE: Multidetector CT imaging of the abdomen and pelvis was performed using the standard protocol following bolus administration of intravenous contrast. CONTRAST:  145m ISOVUE-300 IOPAMIDOL (ISOVUE-300) INJECTION 61% COMPARISON:  None. FINDINGS: Lower chest: Multiple pulmonary nodules are noted in both lung bases consistent with metastatic disease. Hepatobiliary: No gallstones are noted. Multiple rounded hypodense lesions are seen throughout the hepatic parenchyma consistent with metastatic disease. Pancreas: Unremarkable. No pancreatic ductal dilatation or surrounding inflammatory changes. Spleen: Normal in size without focal abnormality. Adrenals/Urinary Tract: Adrenal glands appear normal. No hydronephrosis or renal obstruction is noted. No renal or ureteral calculi are noted. Urinary bladder is unremarkable. 2.1 x 1.3 cm complex low density is seen in mid pole of right kidney 1.8 x 1.2 cm complex low density is noted in midpole of left kidney. Stomach/Bowel: Stomach is within normal limits. Appendix appears normal. No evidence of bowel wall thickening, distention, or inflammatory changes. Vascular/Lymphatic: No significant vascular findings are present. No enlarged abdominal or pelvic lymph nodes. Reproductive: 4 cm uterine fibroid is noted. Ovaries are unremarkable. Other: 2.3 cm solid nodule is noted anteriorly in the pelvis concerning for metastatic disease or  peritoneal carcinomatosis. Musculoskeletal: Lytic lesion is seen and L1 vertebral body resulting in mild compression fracture. This is consistent with metastatic disease. IMPRESSION: Multiple pulmonary and hepatic metastatic lesions are noted. 2.3 cm solid nodule is noted anteriorly in the pelvis concerning for metastatic disease or peritoneal carcinomatosis. Pathologic fracture seen involving L1 vertebral body consistent with metastatic disease. Complex low density lesions are noted in both kidneys, with the largest measuring 2.1 cm on the right. These may simply represent complex renal cysts, but neoplasm cannot be excluded, and further evaluation with MRI is recommended. Probable 4 cm uterine fibroid. These results will be called to the ordering clinician or representative by the Radiologist Assistant, and communication documented in the PACS or zVision Dashboard. Electronically Signed  By: Marijo Conception, M.D.   On: 02/12/2017 11:18   US Biopsy (liver)  Result Date: 02/21/2017 INDICATION: 57 year old with metastatic disease in the lungs, liver and abdominal cavity. Patient had melanoma 7 years ago and there is concern for local recurrence in the right upper neck. Tissue diagnosis is needed. EXAM: ULTRASOUND-GUIDED CORE BIOPSY OF RIGHT HEPATIC LESION. MEDICATIONS: None. ANESTHESIA/SEDATION: Moderate (conscious) sedation was employed during this procedure. A total of Versed 2.5 mg and Fentanyl 75 mcg was administered intravenously. Moderate Sedation Time: 20 minutes. The patient's level of consciousness and vital signs were monitored continuously by radiology nursing throughout the procedure under my direct supervision. FLUOROSCOPY TIME:  None COMPLICATIONS: None immediate. PROCEDURE: Informed written consent was obtained from the patient after a thorough discussion of the procedural risks, benefits and alternatives. All questions were addressed. A timeout was performed prior to the initiation of the  procedure. Right upper neck and liver were evaluated with ultrasound. Liver was selected for biopsy. The right abdomen was prepped with chlorhexidine and sterile field was created. Skin and soft tissues were anesthetized with 1% lidocaine. 17 gauge coaxial needle was directed into the right hepatic lesion with ultrasound guidance. Total of 5 core biopsies were obtained with an 18 gauge device. Four adequate specimens were placed in formalin. 17 gauge needle was removed without complication. Bandage placed over the puncture site. FINDINGS: 2.3 cm lesion in the inferior right hepatic lobe was selected for biopsy. Core biopsy needle was identified within the lesion on all occasions. No significant bleeding or hematoma formation following the liver biopsy. Ultrasound images were also obtained of the palpable area in the right upper neck. There is a mildly irregular bilobed hypoechoic lesion in the right upper neck that measures 2.4 x 1.2 x 1.7 cm. IMPRESSION: Successful ultrasound-guided core biopsies of a right hepatic lesion. Irregular nodular lesion in the right upper neck. Reportedly, this lesion is near the previous melanoma resection. Findings concerning for recurrent melanoma in the right neck. Electronically Signed   By: Markus Daft M.D.   On: 02/21/2017 09:29    ASSESSMENT & PLAN:   Metastatic melanoma to liver [facility]) Multiple metastatic lesions to the liver; bilateral lung nodules; L1 vertebral compression fracture- highly suggestive of malignancy/metastatic.  Preliminary liver biopsy pathology-positive for malignancy; immunohistochemistry still pending.  #Given the high suspicion for metastatic melanoma-reviewed the treatment options would include immunotherapy combination Ipi+Nivo every 3 weeks and 4 cycles-followed by Nivo every 2 weeks.  #Discussed treatments are palliative not curative; however 60-70% response rates; 20% durable responses.  I discussed the mechanism of action; length of treatments  are likely ongoing/based upon the results of the scans. Discussed the potential side effects of immunotherapy including but not limited to diarrhea; skin rash; elevated LFTs/endocrine abnormalities etc.  #Brain metastases-fairly asymptomatic; 3 lesions-1 approximately 2 cm in size; and other 2 2-4 mm in size.  Recommend tapering dexamethasone to 2 mg once a day.  Patient met with radiation oncology this morning.  Given the approximately 60% chance of response intracranially with immunotherapy combination. Left a message discussed with Dr. Donella Stade.    # History of thyroid cancer- appears early-stage; on Synthroid.Ordered TSH.  # L1 vertebral body lesion- pathologic compression fracture- on tramadol pain control.  Improved on steroids.  Discussed regarding kyphoplasty ablation with IR; radiation.  Plan start Xgeva today.  Discussed the potential complications including but not limited to hypocalcemia, osteonecrosis of the jaw.  #Tentatively set up for treatment 12/07; ipi+nivo.  Await TSH.  #  I reviewed the blood work- with the patient in detail; also reviewed the imaging independently [as summarized above]; and with the patient in detail.   All questions were answered. The patient knows to call the clinic with any problems, questions or concerns.    Cammie Sickle, MD 02/25/2017 3:50 PM

## 2017-02-25 NOTE — Patient Instructions (Signed)

## 2017-02-25 NOTE — Consult Note (Signed)
NEW PATIENT EVALUATION  Name: Tanya Vega  MRN: [id number]  Date:   02/25/2017     DOB: [birth year]   This 56 y.o. female patient presents to the clinic for initial evaluation of brain metastasis secondary to widespread metastatic malignant melanoma.  REFERRING PHYSICIAN: Dion Body, MD  CHIEF COMPLAINT:  Chief Complaint  Patient presents with  . Brain Tumor    Initial evaluation    DIAGNOSIS: The encounter diagnosis was Brain metastases ([place]).   PREVIOUS INVESTIGATIONS:  Clinical notes reviewed Pathology reports requested for my review Brain MRI and CT scans reviewed PET CT scan to follow  HPI: Patient is a pleasant 56 year old female with a history of right neck melanoma resected back in 2011 with no adjuvant treatment. She recently presented in October with increasing back pain. She also developed some abdominal distention. She was also noted to have a right cervical node. Unfortunately workup including imaging showed multiple pulmonary hepatic metastatic lesions consistent with widespread metastatic melanoma. She also has a 2.3 cm solid nodule anteriorly in the pelvis concerning for metastatic disease or peritoneal carcinomatosis. The origin of her back pain is a pathologic fracture L1 consistent with metastatic disease. Brain MRI showed 3 intracranial lesions with the largest being a right posterior frontal lesion with central necrosis. Patient is undergone a biopsy of her liver metastasis which is pending. She is seen today for consideration of brain radiation. Interestingly she is fairly asymptomatic except for the back pain. She specifically denies any change in visual fields any focal neurologic deficits or headaches. She's currently on low-dose steroids.  PLANNED TREATMENT REGIMEN: Whole brain radiation plus boost  PAST MEDICAL HISTORY:  has a past medical history of Hypothyroidism, Melanoma of skin ([place]) (2011), and Thyroid cancer ([place]) (2001).    PAST SURGICAL  HISTORY:  Past Surgical History:  Procedure Laterality Date  . BREAST BIOPSY Right 2005   neg  . BREAST BIOPSY Left 07/11/2016   radial scar  . BREAST EXCISIONAL BIOPSY Left 08/06/2016   lumpectomy for radial scar  . BREAST LUMPECTOMY WITH NEEDLE LOCALIZATION Left 08/06/2016   Procedure: BREAST LUMPECTOMY WITH NEEDLE LOCALIZATION;  Surgeon: Leonie Green, MD;  Location: [facility] ORS;  Service: General;  Laterality: Left;  Marland Kitchen MELANOMA EXCISION  2011   ear  . THYROIDECTOMY, PARTIAL Left     FAMILY HISTORY: family history includes Breast cancer in her paternal aunt; Breast cancer (age of onset: 36) in her mother; Colon cancer (age of onset: 73) in her brother.  SOCIAL HISTORY:  reports that she quit smoking about 17 years ago. Her smoking use included cigarettes. She has a 5.00 pack-year smoking history. she has never used smokeless tobacco. She reports that she drinks alcohol. She reports that she does not use drugs.  ALLERGIES: Tetanus-diphtheria toxoids td  MEDICATIONS:  Current Outpatient Medications  Medication Sig Dispense Refill  . atorvastatin (LIPITOR) 40 MG tablet Take 40 mg by mouth every evening.    Marland Kitchen dexamethasone (DECADRON) 2 MG tablet Take 4mg  (2 tabs) by mouth every 6 hours for the next 24 hours, then take 2mg  (1 tab) by mouth twice daily for 2 weeks. 36 tablet 0  . levothyroxine (SYNTHROID, LEVOTHROID) 100 MCG tablet Take 100 mcg by mouth daily before breakfast.    . traMADol (ULTRAM) 50 MG tablet Take 1 tablet by mouth every 6 (six) hours as needed for pain.     No current facility-administered medications for this encounter.     ECOG PERFORMANCE STATUS:  1 -  Symptomatic but completely ambulatory  REVIEW OF SYSTEMS: Except for the back pain Patient denies any weight loss, fatigue, weakness, fever, chills or night sweats. Patient denies any loss of vision, blurred vision. Patient denies any ringing  of the ears or hearing loss. No irregular heartbeat. Patient denies  heart murmur or history of fainting. Patient denies any chest pain or pain radiating to her upper extremities. Patient denies any shortness of breath, difficulty breathing at night, cough or hemoptysis. Patient denies any swelling in the lower legs. Patient denies any nausea vomiting, vomiting of blood, or coffee ground material in the vomitus. Patient denies any stomach pain. Patient states has had normal bowel movements no significant constipation or diarrhea. Patient denies any dysuria, hematuria or significant nocturia. Patient denies any problems walking, swelling in the joints or loss of balance. Patient denies any skin changes, loss of hair or loss of weight. Patient denies any excessive worrying or anxiety or significant depression. Patient denies any problems with insomnia. Patient denies excessive thirst, polyuria, polydipsia. Patient denies any swollen glands, patient denies easy bruising or easy bleeding. Patient denies any recent infections, allergies or URI. Patient "s visual fields have not changed significantly in recent time.    PHYSICAL EXAM: BP (!) 164/97   Pulse (!) 120   Temp 97.6 F (36.4 C)   Wt 144 lb 4.7 oz (65.5 kg)   BMI 24.77 kg/m  Crude visual fields are within normal range.Well-developed well-nourished patient in NAD. HEENT reveals PERLA, EOMI, discs not visualized.  Oral cavity is clear. No oral mucosal lesions are identified. Neck is clear without evidence of cervical or supraclavicular adenopathy. Lungs are clear to A&P. Cardiac examination is essentially unremarkable with regular rate and rhythm without murmur rub or thrill. Abdomen is benign with no organomegaly or masses noted. Motor sensory and DTR levels are equal and symmetric in the upper and lower extremities. Cranial nerves II through XII are grossly intact. Proprioception is intact. No peripheral adenopathy or edema is identified. No motor or sensory levels are noted. Crude visual fields are within normal  range.  LABORATORY DATA: Pathology reports pending    RADIOLOGY RESULTS: MRI of brain CT scan of chest abdomen and pelvis reviewed PET CT scan pending   IMPRESSION: Widespread metastatic disease presumed of melanoma origin with involvement of liver lung bone and brain in 56 year old female  PLAN: At this time I to go ahead with whole brain radiation therapy. Would treat to 3000 cGy in 12 fractions. Would also boost the large right frontal lesion another 2000 cGy in 5 fractions using I am RT treatment planning and delivery. Risks and benefits of whole brain radiation plus boost including loss of hair alteration of blood counts skin reaction fatigue all were discussed in detail with the patient and her husband. I also would anticipate the palliative course of radiation therapy to her lumbar spine in the area of metastatic disease to alleviate some of her back pain and will start planning that as soon as we get her under treatment for brain metastasis. I personally set up and ordered CT simulation of her brain for later this week.  I would like to take this opportunity to thank you for allowing me to participate in the care of your patient.Armstead Peaks., MD

## 2017-02-25 NOTE — Progress Notes (Signed)
Here for follow up

## 2017-02-26 ENCOUNTER — Other Ambulatory Visit: Payer: Self-pay | Admitting: Internal Medicine

## 2017-02-26 ENCOUNTER — Ambulatory Visit: Payer: BC Managed Care – PPO

## 2017-02-26 ENCOUNTER — Ambulatory Visit
Admission: RE | Admit: 2017-02-26 | Discharge: 2017-02-26 | Disposition: A | Discharge status: Home / Self Care | Payer: BC Managed Care – PPO | Source: Ambulatory Visit | Attending: Radiation Oncology | Admitting: Radiation Oncology

## 2017-02-26 DIAGNOSIS — C7931 Secondary malignant neoplasm of brain: Secondary | ICD-10-CM | POA: Diagnosis not present

## 2017-02-26 LAB — SURGICAL PATHOLOGY

## 2017-02-26 NOTE — Progress Notes (Signed)
Discussed with Dr. Lisa Roca positive for melanoma; recommend combination immunotherapy.  Orders entered.   Plan to get started 12/07-Friday.  Please have this approved ASAP.

## 2017-02-26 NOTE — Progress Notes (Signed)
START ON PATHWAY REGIMEN - Melanoma   Nivolumab 1 mg/kg + Ipilimumab 3 mg/kg q21 Days x 4 Doses:   A cycle is every 21 days:     Nivolumab      Ipilimumab   **Always confirm dose/schedule in your pharmacy ordering system**    Nivolumab 240 mg q14 Days:   A cycle is every 14 days:     Nivolumab   **Always confirm dose/schedule in your pharmacy ordering system**    Patient Characteristics: Stage IV, Unresectable, Symptomatic, First Line, BRAF V600 Wild Type / BRAF V600 Results Pending or Unknown, Candidate for Immunotherapy Disease Subtype: Cutaneous Current Disease Status: Distant Metastases AJCC 8 Stage Grouping: Unknown AJCC T Category: TX AJCC N Category: NX AJCC M Category: M0 Mutation Status: Awaiting BRAF V600 Results Metastatic Disease Type: Symptomatic Would you be surprised if this patient died  in the next year<= I would be surprised if this patient died in the next year Line of Therapy: First Line Immunotherapy Candidate Status: Candidate for Immunotherapy Intent of Therapy: Non-Curative / Palliative Intent, Discussed with Patient

## 2017-02-26 NOTE — Patient Instructions (Signed)
Ipilimumab injection What is this medicine? IPILIMUMAB (IP i LIM ue mab) is a monoclonal antibody. It is used to treat melanoma, a type of skin cancer. This medicine may be used for other purposes; ask your health care provider or pharmacist if you have questions. COMMON BRAND NAME(S): YERVOY What should I tell my health care provider before I take this medicine? They need to know if you have any of these conditions: -Addison's disease -blood in your stools (black or tarry stools) or if you have blood in your vomit -eye disease, vision problems -history of pancreatitis -history of stomach bleeding -immune system problems -inflammatory bowel disease -kidney disease -liver disease -lupus -myasthenia gravis -organ transplant -rheumatoid arthritis -sarcoidosis -stomach or intestine problems -thyroid disease -tingling of the fingers or toes, or other nerve disorder -an unusual or allergic reaction to ipilimumab, other medicines, foods, dyes, or preservatives -pregnant or trying to get pregnant -breast-feeding How should I use this medicine? This medicine is for infusion into a vein. It is given by a health care professional in a hospital or clinic setting. A special MedGuide will be given to you before each treatment. Be sure to read this information carefully each time. Talk to your pediatrician regarding the use of this medicine in children. While this drug may be prescribed for children as young as 12 years for selected conditions, precautions do apply. Overdosage: If you think you have taken too much of this medicine contact a poison control center or emergency room at once. NOTE: This medicine is only for you. Do not share this medicine with others. What if I miss a dose? It is important not to miss your dose. Call your doctor or health care professional if you are unable to keep an appointment. What may interact with this medicine? Interactions are not expected. This list may  not describe all possible interactions. Give your health care provider a list of all the medicines, herbs, non-prescription drugs, or dietary supplements you use. Also tell them if you smoke, drink alcohol, or use illegal drugs. Some items may interact with your medicine. What should I watch for while using this medicine? Tell your doctor or healthcare professional if your symptoms do not start to get better or if they get worse. Do not become pregnant while taking this medicine or for 3 months after stopping it. Women should inform their doctor if they wish to become pregnant or think they might be pregnant. There is a potential for serious side effects to an unborn child. Talk to your health care professional or pharmacist for more information. Do not breast-feed an infant while taking this medicine or for 3 months after the last dose. Your condition will be monitored carefully while you are receiving this medicine. You may need blood work done while you are taking this medicine. What side effects may I notice from receiving this medicine? Side effects that you should report to your doctor or health care professional as soon as possible: -allergic reactions like skin rash, itching or hives, swelling of the face, lips, or tongue -black, tarry stools -bloody or watery diarrhea -changes in vision -dizziness -eye pain -fast, irregular heartbeat -feeling anxious -feeling faint or lightheaded, falls -nausea, vomiting -pain, tingling, numbness in the hands or feet -redness, blistering, peeling or loosening of the skin, including inside the mouth -signs and symptoms of liver injury like dark yellow or brown urine; general ill feeling or flu-like symptoms; light-colored stools; loss of appetite; nausea; right upper belly pain; unusually weak   or tired; yellowing of the eyes or skin -unusual bleeding or bruising Side effects that usually do not require medical attention (report to your doctor or health  care professional if they continue or are bothersome): -headache -loss of appetite -trouble sleeping This list may not describe all possible side effects. Call your doctor for medical advice about side effects. You may report side effects to FDA at 1-800-FDA-1088. Where should I keep my medicine? This drug is given in a hospital or clinic and will not be stored at home. NOTE: This sheet is a summary. It may not cover all possible information. If you have questions about this medicine, talk to your doctor, pharmacist, or health care provider.  2018 [employer] Standard (2015-10-17 11:41:46) Nivolumab injection What is this medicine? NIVOLUMAB (nye VOL ue mab) is a monoclonal antibody. It is used to treat melanoma, lung cancer, kidney cancer, head and neck cancer, Hodgkin lymphoma, urothelial cancer, colon cancer, and liver cancer. This medicine may be used for other purposes; ask your health care provider or pharmacist if you have questions. COMMON BRAND NAME(S): Opdivo What should I tell my health care provider before I take this medicine? They need to know if you have any of these conditions: -diabetes -immune system problems -kidney disease -liver disease -lung disease -organ transplant -stomach or intestine problems -thyroid disease -an unusual or allergic reaction to nivolumab, other medicines, foods, dyes, or preservatives -pregnant or trying to get pregnant -breast-feeding How should I use this medicine? This medicine is for infusion into a vein. It is given by a health care professional in a hospital or clinic setting. A special MedGuide will be given to you before each treatment. Be sure to read this information carefully each time. Talk to your pediatrician regarding the use of this medicine in children. While this drug may be prescribed for children as young as 12 years for selected conditions, precautions do apply. Overdosage: If you think you have taken too much of this  medicine contact a poison control center or emergency room at once. NOTE: This medicine is only for you. Do not share this medicine with others. What if I miss a dose? It is important not to miss your dose. Call your doctor or health care professional if you are unable to keep an appointment. What may interact with this medicine? Interactions have not been studied. Give your health care provider a list of all the medicines, herbs, non-prescription drugs, or dietary supplements you use. Also tell them if you smoke, drink alcohol, or use illegal drugs. Some items may interact with your medicine. This list may not describe all possible interactions. Give your health care provider a list of all the medicines, herbs, non-prescription drugs, or dietary supplements you use. Also tell them if you smoke, drink alcohol, or use illegal drugs. Some items may interact with your medicine. What should I watch for while using this medicine? This drug may make you feel generally unwell. Continue your course of treatment even though you feel ill unless your doctor tells you to stop. You may need blood work done while you are taking this medicine. Do not become pregnant while taking this medicine or for 5 months after stopping it. Women should inform their doctor if they wish to become pregnant or think they might be pregnant. There is a potential for serious side effects to an unborn child. Talk to your health care professional or pharmacist for more information. Do not breast-feed an infant while taking this medicine. What   side effects may I notice from receiving this medicine? Side effects that you should report to your doctor or health care professional as soon as possible: -allergic reactions like skin rash, itching or hives, swelling of the face, lips, or tongue -black, tarry stools -blood in the urine -bloody or watery diarrhea -changes in vision -change in sex drive -changes in emotions or moods -chest  pain -confusion -cough -decreased appetite -diarrhea -facial flushing -feeling faint or lightheaded -fever, chills -hair loss -hallucination, loss of contact with reality -headache -irritable -joint pain -loss of memory -muscle pain -muscle weakness -seizures -shortness of breath -signs and symptoms of high blood sugar such as dizziness; dry mouth; dry skin; fruity breath; nausea; stomach pain; increased hunger or thirst; increased urination -signs and symptoms of kidney injury like trouble passing urine or change in the amount of urine -signs and symptoms of liver injury like dark yellow or brown urine; general ill feeling or flu-like symptoms; light-colored stools; loss of appetite; nausea; right upper belly pain; unusually weak or tired; yellowing of the eyes or skin -stiff neck -swelling of the ankles, feet, hands -weight gain Side effects that usually do not require medical attention (report to your doctor or health care professional if they continue or are bothersome): -bone pain -constipation -tiredness -vomiting This list may not describe all possible side effects. Call your doctor for medical advice about side effects. You may report side effects to FDA at 1-800-FDA-1088. Where should I keep my medicine? This drug is given in a hospital or clinic and will not be stored at home. NOTE: This sheet is a summary. It may not cover all possible information. If you have questions about this medicine, talk to your doctor, pharmacist, or health care provider.  2018 [employer]/Gold Standard (2015-12-18 17:49:34)  

## 2017-02-27 ENCOUNTER — Telehealth: Payer: Self-pay | Admitting: Internal Medicine

## 2017-02-27 ENCOUNTER — Other Ambulatory Visit: Payer: Self-pay | Admitting: *Deleted

## 2017-02-27 ENCOUNTER — Inpatient Hospital Stay: Payer: BC Managed Care – PPO

## 2017-02-27 DIAGNOSIS — C7931 Secondary malignant neoplasm of brain: Secondary | ICD-10-CM | POA: Diagnosis not present

## 2017-02-27 DIAGNOSIS — C7951 Secondary malignant neoplasm of bone: Secondary | ICD-10-CM

## 2017-02-27 DIAGNOSIS — C787 Secondary malignant neoplasm of liver and intrahepatic bile duct: Secondary | ICD-10-CM

## 2017-02-27 NOTE — Telephone Encounter (Signed)
Patient notified of new apt time

## 2017-02-27 NOTE — Telephone Encounter (Signed)
Heather- Please inform patient that pathology is confirmed as melanoma [as suspected]; awaiting on insurance approval-should have it early next week.  Please reschedule opdivo+nivo to next week/ in [place] [12/11-Tuesday]; and also have pt se me on that day.

## 2017-02-28 ENCOUNTER — Inpatient Hospital Stay: Payer: BC Managed Care – PPO

## 2017-02-28 ENCOUNTER — Ambulatory Visit
Admission: RE | Admit: 2017-02-28 | Discharge: 2017-02-28 | Disposition: A | Discharge status: Home / Self Care | Payer: BC Managed Care – PPO | Source: Ambulatory Visit | Attending: Internal Medicine | Admitting: Internal Medicine

## 2017-02-28 ENCOUNTER — Inpatient Hospital Stay: Payer: BC Managed Care – PPO | Admitting: Internal Medicine

## 2017-02-28 DIAGNOSIS — M899 Disorder of bone, unspecified: Secondary | ICD-10-CM | POA: Diagnosis not present

## 2017-02-28 DIAGNOSIS — C7901 Secondary malignant neoplasm of right kidney and renal pelvis: Secondary | ICD-10-CM | POA: Diagnosis not present

## 2017-02-28 DIAGNOSIS — C787 Secondary malignant neoplasm of liver and intrahepatic bile duct: Secondary | ICD-10-CM | POA: Diagnosis present

## 2017-02-28 DIAGNOSIS — M8448XA Pathological fracture, other site, initial encounter for fracture: Secondary | ICD-10-CM | POA: Insufficient documentation

## 2017-02-28 DIAGNOSIS — C439 Malignant melanoma of skin, unspecified: Secondary | ICD-10-CM | POA: Diagnosis not present

## 2017-02-28 DIAGNOSIS — C7931 Secondary malignant neoplasm of brain: Secondary | ICD-10-CM | POA: Diagnosis not present

## 2017-02-28 DIAGNOSIS — C7800 Secondary malignant neoplasm of unspecified lung: Secondary | ICD-10-CM | POA: Diagnosis not present

## 2017-02-28 DIAGNOSIS — R591 Generalized enlarged lymph nodes: Secondary | ICD-10-CM | POA: Insufficient documentation

## 2017-02-28 DIAGNOSIS — C786 Secondary malignant neoplasm of retroperitoneum and peritoneum: Secondary | ICD-10-CM | POA: Insufficient documentation

## 2017-02-28 DIAGNOSIS — K769 Liver disease, unspecified: Secondary | ICD-10-CM

## 2017-02-28 LAB — GLUCOSE, CAPILLARY: Glucose-Capillary: 102 mg/dL — ABNORMAL HIGH (ref 65–99)

## 2017-02-28 MED ORDER — FLUDEOXYGLUCOSE F - 18 (FDG) INJECTION
13.6600 | Freq: Once | INTRAVENOUS | Status: AC | PRN
  Administered 2017-02-28: 13.66 via INTRAVENOUS

## 2017-03-04 ENCOUNTER — Inpatient Hospital Stay (HOSPITAL_BASED_OUTPATIENT_CLINIC_OR_DEPARTMENT_OTHER): Payer: BC Managed Care – PPO | Admitting: Internal Medicine

## 2017-03-04 ENCOUNTER — Other Ambulatory Visit: Payer: Self-pay | Admitting: *Deleted

## 2017-03-04 ENCOUNTER — Inpatient Hospital Stay: Payer: BC Managed Care – PPO

## 2017-03-04 VITALS — BP 133/84 | HR 103 | Resp 18

## 2017-03-04 VITALS — BP 143/83 | HR 110 | Temp 99.1°F | Resp 18

## 2017-03-04 DIAGNOSIS — C7951 Secondary malignant neoplasm of bone: Secondary | ICD-10-CM

## 2017-03-04 DIAGNOSIS — C434 Malignant melanoma of scalp and neck: Secondary | ICD-10-CM | POA: Diagnosis not present

## 2017-03-04 DIAGNOSIS — Z87891 Personal history of nicotine dependence: Secondary | ICD-10-CM

## 2017-03-04 DIAGNOSIS — C7931 Secondary malignant neoplasm of brain: Secondary | ICD-10-CM

## 2017-03-04 DIAGNOSIS — C7800 Secondary malignant neoplasm of unspecified lung: Secondary | ICD-10-CM

## 2017-03-04 DIAGNOSIS — Z79899 Other long term (current) drug therapy: Secondary | ICD-10-CM

## 2017-03-04 DIAGNOSIS — C787 Secondary malignant neoplasm of liver and intrahepatic bile duct: Secondary | ICD-10-CM

## 2017-03-04 DIAGNOSIS — Z452 Encounter for adjustment and management of vascular access device: Secondary | ICD-10-CM

## 2017-03-04 DIAGNOSIS — Z85850 Personal history of malignant neoplasm of thyroid: Secondary | ICD-10-CM

## 2017-03-04 DIAGNOSIS — Z7189 Other specified counseling: Secondary | ICD-10-CM

## 2017-03-04 DIAGNOSIS — D72829 Elevated white blood cell count, unspecified: Secondary | ICD-10-CM | POA: Diagnosis not present

## 2017-03-04 LAB — CBC WITH DIFFERENTIAL/PLATELET
Basophils Absolute: 0.1 10*3/uL (ref 0–0.1)
Basophils Relative: 1 %
EOS PCT: 0 %
Eosinophils Absolute: 0 10*3/uL (ref 0–0.7)
HEMATOCRIT: 39.5 % (ref 35.0–47.0)
Hemoglobin: 13.3 g/dL (ref 12.0–16.0)
LYMPHS ABS: 2.6 10*3/uL (ref 1.0–3.6)
LYMPHS PCT: 14 %
MCH: 30.5 pg (ref 26.0–34.0)
MCHC: 33.6 g/dL (ref 32.0–36.0)
MCV: 90.7 fL (ref 80.0–100.0)
MONO ABS: 1.1 10*3/uL — AB (ref 0.2–0.9)
Monocytes Relative: 6 %
Neutro Abs: 14.8 10*3/uL — ABNORMAL HIGH (ref 1.4–6.5)
Neutrophils Relative %: 79 %
PLATELETS: 364 10*3/uL (ref 150–440)
RBC: 4.36 MIL/uL (ref 3.80–5.20)
RDW: 12.2 % (ref 11.5–14.5)
WBC: 18.6 10*3/uL — AB (ref 3.6–11.0)

## 2017-03-04 LAB — COMPREHENSIVE METABOLIC PANEL
ALT: 45 U/L (ref 14–54)
ANION GAP: 9 (ref 5–15)
AST: 27 U/L (ref 15–41)
Albumin: 3.5 g/dL (ref 3.5–5.0)
Alkaline Phosphatase: 151 U/L — ABNORMAL HIGH (ref 38–126)
BILIRUBIN TOTAL: 0.3 mg/dL (ref 0.3–1.2)
BUN: 8 mg/dL (ref 6–20)
CO2: 24 mmol/L (ref 22–32)
Calcium: 8.3 mg/dL — ABNORMAL LOW (ref 8.9–10.3)
Chloride: 98 mmol/L — ABNORMAL LOW (ref 101–111)
Creatinine, Ser: 0.59 mg/dL (ref 0.44–1.00)
Glucose, Bld: 122 mg/dL — ABNORMAL HIGH (ref 65–99)
POTASSIUM: 3.4 mmol/L — AB (ref 3.5–5.1)
Sodium: 131 mmol/L — ABNORMAL LOW (ref 135–145)
TOTAL PROTEIN: 7.8 g/dL (ref 6.5–8.1)

## 2017-03-04 LAB — TSH: TSH: 1.946 u[IU]/mL (ref 0.350–4.500)

## 2017-03-04 MED ORDER — SODIUM CHLORIDE 0.9 % IV SOLN
1.0000 mg/kg | Freq: Once | INTRAVENOUS | Status: AC
  Administered 2017-03-04: 70 mg via INTRAVENOUS
  Filled 2017-03-04: qty 7

## 2017-03-04 MED ORDER — TRAMADOL HCL 50 MG PO TABS: 50.0000 mg | ORAL_TABLET | Freq: Four times a day (QID) | ORAL | 0 refills | Status: DC | PRN

## 2017-03-04 MED ORDER — SODIUM CHLORIDE 0.9 % IV SOLN
200.0000 mg | Freq: Once | INTRAVENOUS | Status: AC
  Administered 2017-03-04: 200 mg via INTRAVENOUS
  Filled 2017-03-04: qty 40

## 2017-03-04 MED ORDER — SODIUM CHLORIDE 0.9 % IV SOLN
Freq: Once | INTRAVENOUS | Status: AC
  Administered 2017-03-04: 12:00:00 via INTRAVENOUS
  Filled 2017-03-04: qty 1000

## 2017-03-04 NOTE — Progress Notes (Signed)
Patient ordered renal cell carcinoma nivo/yervoy in treatment plan.  Contacted MD b/c doses for RCC were wrong, he stated patient actually getting nivo/yervoy for metastatic melanoma.  Changed name of treatment plan.

## 2017-03-04 NOTE — Progress Notes (Signed)
Five attempts to try to start IV catheter with success on fifth try.  Patient has requested port insertion since she has a history of being a difficult stick.  Medical clinic team made aware and will get the port scheduled.

## 2017-03-04 NOTE — Progress Notes (Signed)
[facility] NOTE  Patient Care Team: Dion Body, MD as PCP - General (Family Medicine)  CHIEF COMPLAINTS/PURPOSE OF CONSULTATION:  Multiple liver lesions  # NOV 2018- MULTIPLE LIVER LESIONS/lung lesions [awaiting Bx- on Nov 30th]  # Hx of Melanoma- dec 2011 [SLNBx- 0/4-Neg;[facility] ]; Hx of thyroid cancer- feb 2001- s/p left throid lobectomy [[place]]; NO RAIU.   Oncology History   # NOV 2018-RECURRENT METASTATIC MELANOMA MULTIPLE LIVER LESIONS/lung lesions [s/p Bx on Nov 30th]; DEc 2018- PET-multiple lung liver bone metastasis.   # Dec 11th,2018- ipi+Nivo  # Dec 1st 2018- Brain mets- 29mm; and 2 other 2-4 mm.   # Bone mets- X-geva [12/04]  # Hx of Melanoma- dec 2011 [SLNBx- 0/4-Neg;[facility] ]; Hx of thyroid cancer- feb 2001- s/p left throid lobectomy [[place]]; NO RAIU.  Molecular testing: Foundation One pending.        Metastatic melanoma to liver [facility])     HISTORY OF PRESENTING ILLNESS:  Tanya Vega 56 y.o.  female prior history of melanoma 2011 [stage I]-with multiple metastases to the liver lung bone-is currently status post liver biopsy.  She is here to review the results of the pathology; imaging; proceed with treatment.  Patient is currently on dexamethasone 2 mg.  She continues to be asymptomatic.  No headaches.  No nausea vomiting.  The interim she has been evaluated by radiation oncology-plan is to start radiation SBR T to the larger lesion-starting 12/17th.   Patient denies her appetite is good in general. She denies any weight loss.  Patient continues to take tramadol as needed for back pain.   ROS: A complete 10 point review of system is done which is negative except mentioned above in history of present illness  MEDICAL HISTORY:  Past Medical History:  Diagnosis Date  . Hypothyroidism   . Melanoma of skin ([facility]) 2011   Resected from Right neck area with 4 lymph nodes as well.   . Thyroid cancer ([place]) 2001   Partial  thyroidectomy    SURGICAL HISTORY: Past Surgical History:  Procedure Laterality Date  . BREAST BIOPSY Right 2005   neg  . BREAST BIOPSY Left 07/11/2016   radial scar  . BREAST EXCISIONAL BIOPSY Left 08/06/2016   lumpectomy for radial scar  . BREAST LUMPECTOMY WITH NEEDLE LOCALIZATION Left 08/06/2016   Procedure: BREAST LUMPECTOMY WITH NEEDLE LOCALIZATION;  Surgeon: Leonie Green, MD;  Location: [facility] ORS;  Service: General;  Laterality: Left;  Marland Kitchen MELANOMA EXCISION  2011   ear  . 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 Daughter- 81.  Social History   Socioeconomic History  . Marital status: Married    Spouse name: Not on file  . Number of children: Not on file  . Years of education: Not on file  . Highest education level: Not on file  Social Needs  . Financial resource strain: Not on file  . Food insecurity - worry: Not on file  . Food insecurity - inability: Not on file  . Transportation needs - medical: Not on file  . Transportation needs - non-medical: Not on file  Occupational History  . Not on file  Tobacco Use  . Smoking status: Former Smoker    Packs/day: 0.25    Years: 20.00    Pack years: 5.00    Types: Cigarettes    Last attempt to quit: 03/26/1999    Years since quitting: 17.9  .  Smokeless tobacco: Never Used  Substance and Sexual Activity  . Alcohol use: Yes    Comment: WEEKENDS  . Drug use: No  . Sexual activity: Yes  Other Topics Concern  . Not on file  Social History Narrative  . Not on file    FAMILY HISTORY: Brother died of colon. Mom-survived.  Family History  Problem Relation Age of Onset  . Breast cancer Mother 32  . Breast cancer Paternal Aunt   . Colon cancer Brother 73    ALLERGIES:  is allergic to tetanus-diphtheria toxoids td.  MEDICATIONS:  Current Outpatient Medications  Medication Sig Dispense Refill  . atorvastatin (LIPITOR) 40 MG  tablet Take 40 mg by mouth every evening.    Marland Kitchen dexamethasone (DECADRON) 2 MG tablet Take 4mg  (2 tabs) by mouth every 6 hours for the next 24 hours, then take 2mg  (1 tab) by mouth twice daily for 2 weeks. (Patient taking differently: Take 2 mg by mouth daily. Take 4mg  (2 tabs) by mouth every 6 hours for the next 24 hours, then take 2mg  (1 tab) by mouth twice daily for 2 weeks.) 36 tablet 0  . levothyroxine (SYNTHROID, LEVOTHROID) 100 MCG tablet Take 100 mcg by mouth daily before breakfast.    . traMADol (ULTRAM) 50 MG tablet Take 1 tablet (50 mg total) by mouth every 6 (six) hours as needed. 60 tablet 0   No current facility-administered medications for this visit.    Facility-Administered Medications Ordered in Other Visits  Medication Dose Route Frequency Provider Last Rate Last Dose  . 0.9 %  sodium chloride infusion   Intravenous Once Charlaine Dalton R, MD      . ipilimumab (YERVOY) 200 mg in sodium chloride 0.9 % 100 mL chemo infusion  200 mg Intravenous Once Charlaine Dalton R, MD      . nivolumab (OPDIVO) 70 mg in sodium chloride 0.9 % 100 mL chemo infusion  1 mg/kg (Treatment Plan Recorded) Intravenous Once Cammie Sickle, MD          .  PHYSICAL EXAMINATION: ECOG PERFORMANCE STATUS: 1 - Symptomatic but completely ambulatory  Vitals:   03/04/17 1115  BP: (!) 143/83  Pulse: (!) 110  Resp: 18  Temp: 99.1 F (37.3 C)   There were no vitals filed for this visit.  GENERAL: Well-nourished well-developed; Alert, no distress and comfortable.   Accompanied by family. EYES: no pallor or icterus OROPHARYNX: no thrush or ulceration; good dentition  NECK: supple, no masses felt; previous melanoma surgery scar noted level II lymph node approximately 2 cm in size; nontender. LYMPH:  no palpable lymphadenopathy in the axillary or inguinal regions LUNGS: clear to auscultation and  No wheeze or crackles HEART/CVS: regular rate & rhythm and no murmurs; No lower extremity  edema ABDOMEN: abdomen soft, non-tender and normal bowel sounds Musculoskeletal:no cyanosis of digits and no clubbing  PSYCH: alert & oriented x 3 with fluent speech NEURO: no focal motor/sensory deficits SKIN:  no rashes or significant lesions  LABORATORY DATA:  I have reviewed the data as listed Lab Results  Component Value Date   WBC 18.6 (H) 03/04/2017   HGB 13.3 03/04/2017   HCT 39.5 03/04/2017   MCV 90.7 03/04/2017   PLT 364 03/04/2017   Recent Labs    02/22/17 1224 03/04/17 1050  NA 133* 131*  K 3.3* 3.4*  CL 97* 98*  CO2 25 24  GLUCOSE 141* 122*  BUN 7 8  CREATININE 0.69 0.59  CALCIUM 9.1 8.3*  GFRNONAA >60 >60  GFRAA >60 >60  PROT 7.4 7.8  ALBUMIN 3.8 3.5  AST 28 27  ALT 39 45  ALKPHOS 132* 151*  BILITOT 0.2* 0.3    RADIOGRAPHIC STUDIES: I have personally reviewed the radiological images as listed and agreed with the findings in the report. Mr Jeri Cos Wo Contrast  Addendum Date: 02/22/2017   ADDENDUM REPORT: 02/22/2017 09:58 ADDENDUM: These results were called by telephone by myself at the time of interpretation on 02/22/2017 at 9:52 am to Dr. Mike Gip , who verbally acknowledged these results. Electronically Signed   By: Staci Righter M.D.   On: 02/22/2017 09:58   Result Date: 02/22/2017 CLINICAL DATA:  Metastatic melanoma.  Staging. EXAM: MRI HEAD WITHOUT AND WITH CONTRAST TECHNIQUE: Multiplanar, multiecho pulse sequences of the brain and surrounding structures were obtained without and with intravenous contrast. CONTRAST:  74mL MULTIHANCE GADOBENATE DIMEGLUMINE 529 MG/ML IV SOLN COMPARISON:  No prior cranial imaging. FINDINGS: Brain: Multiple intracranial metastatic deposits are present. The largest lesion is in the medial RIGHT frontal lobe, forward of the motor strip. On post infusion imaging, the lesion measures 17 x 23 x 21 mm. Smaller metastatic deposits are noted in the LEFT anterior parietal parasagittal cortex, 4 mm diameter, and a 2 mm lesion,  subependymal location, LEFT inferior, medial, and posterior temporal lobe. Only the RIGHT frontal lesion demonstrates significant vasogenic edema. None of the lesions show T1 or T2 shortening, suggesting they may represent an amelanotic form of melanoma. The lesions are therefore nonhemorrhagic as well. Central necrosis accompanies the RIGHT frontal lesion. Normal cerebral volume, without midline shift, hydrocephalus, or intracranial hemorrhage. No extra-axial fluid collections. Vascular: Normal flow voids. Skull and upper cervical spine: Normal marrow signal. Sinuses/Orbits: No layering fluid or orbital masses. Other: There appears to be a large level 5 nodal mass in the RIGHT neck, incompletely evaluated. See coronal image 17 series 13. IMPRESSION: Three enhancing intracranial lesions consistent with metastatic disease. Lack of hemorrhage/ T1 shortening suggest these represent amelanotic type of melanoma, if indeed that is the primary. Significant vasogenic edema is associated with the RIGHT posterior frontal lesion, along with central necrosis. No significant shift. Incompletely evaluated RIGHT neck nodal mass. CT of the neck with contrast recommended for staging. A call has been placed to the ordering provider. Electronically Signed: By: Staci Righter M.D. On: 02/22/2017 09:21   Ct Abdomen Pelvis W Contrast  Result Date: 02/12/2017 CLINICAL DATA:  Pelvic pain for 1 month. EXAM: CT ABDOMEN AND PELVIS WITH CONTRAST TECHNIQUE: Multidetector CT imaging of the abdomen and pelvis was performed using the standard protocol following bolus administration of intravenous contrast. CONTRAST:  173mL ISOVUE-300 IOPAMIDOL (ISOVUE-300) INJECTION 61% COMPARISON:  None. FINDINGS: Lower chest: Multiple pulmonary nodules are noted in both lung bases consistent with metastatic disease. Hepatobiliary: No gallstones are noted. Multiple rounded hypodense lesions are seen throughout the hepatic parenchyma consistent with metastatic  disease. Pancreas: Unremarkable. No pancreatic ductal dilatation or surrounding inflammatory changes. Spleen: Normal in size without focal abnormality. Adrenals/Urinary Tract: Adrenal glands appear normal. No hydronephrosis or renal obstruction is noted. No renal or ureteral calculi are noted. Urinary bladder is unremarkable. 2.1 x 1.3 cm complex low density is seen in mid pole of right kidney 1.8 x 1.2 cm complex low density is noted in midpole of left kidney. Stomach/Bowel: Stomach is within normal limits. Appendix appears normal. No evidence of bowel wall thickening, distention, or inflammatory changes. Vascular/Lymphatic: No significant vascular findings are present. No enlarged abdominal or pelvic  lymph nodes. Reproductive: 4 cm uterine fibroid is noted. Ovaries are unremarkable. Other: 2.3 cm solid nodule is noted anteriorly in the pelvis concerning for metastatic disease or peritoneal carcinomatosis. Musculoskeletal: Lytic lesion is seen and L1 vertebral body resulting in mild compression fracture. This is consistent with metastatic disease. IMPRESSION: Multiple pulmonary and hepatic metastatic lesions are noted. 2.3 cm solid nodule is noted anteriorly in the pelvis concerning for metastatic disease or peritoneal carcinomatosis. Pathologic fracture seen involving L1 vertebral body consistent with metastatic disease. Complex low density lesions are noted in both kidneys, with the largest measuring 2.1 cm on the right. These may simply represent complex renal cysts, but neoplasm cannot be excluded, and further evaluation with MRI is recommended. Probable 4 cm uterine fibroid. These results will be called to the ordering clinician or representative by the Radiologist Assistant, and communication documented in the PACS or zVision Dashboard. Electronically Signed   By: Marijo Conception, M.D.   On: 02/12/2017 11:18   Nm Pet Image Initial (pi) Whole Body  Result Date: 02/28/2017 CLINICAL DATA:  Initial treatment  strategy for malignant melanoma. EXAM: NUCLEAR MEDICINE PET WHOLE BODY TECHNIQUE: 13.6 mCi F-18 FDG was injected intravenously. Full-ring PET imaging was performed from the vertex to the feet after the radiotracer. CT data was obtained and used for attenuation correction and anatomic localization. FASTING BLOOD GLUCOSE:  Value: 102 mg/dl COMPARISON:  Brain MRI 02/22/2017, CT abdomen pelvis 02/12/2017 FINDINGS: HEAD/NECK Focal metabolic activity in the high RIGHT frontal lobe corresponds to 17 mm lesion on CT and enhancing lesion on MRI. Lesion is intense with metabolic activity above background brain activity Hypermetabolic new RIGHT level 2 lymph node measuring 13 mm SUV max equal 17.7. CHEST Innumerable bilateral round hypermetabolic pulmonary nodules of varying size. Largest nodule measures 22 mm in the RIGHT middle lobe with SUV max equal 9.3. Example nodule in the LEFT lower lobe measures 14 mm (image 158, series 3) with SUV max equal 9.6. Hypermetabolic LEFT hilar lymph node noted ABDOMEN/PELVIS Approximately 6 hypermetabolic lesions within the liver. Lesions measure 1-2 cm. Example lesion in the central LEFT hepatic lobe measures 17 mm SUV max equal 15. Lesion inferior RIGHT hepatic lobe with SUV max equals 16.0 Hypermetabolic lesion within the cortex of the RIGHT kidney extends partially exophytic measuring 15 mm SUV max equal 14.8. Two hypermetabolic mesenteric masses. The more superior centrally in the lower abdomen measures 21 mm (image 260, series 3 )with SUV max 19.4. Second lesion just superior to the bladder measures 3.0 cm (image 284, series 3 with intense radiotracer activity (SUV max equal 30). SKELETON Several discrete hypermetabolic skeletal metastasis. Two lesions in the LEFT femur with SUV max equal 10.0. Lytic lesion in the L1 vertebral body with SUV max equal 12.3. Pathologic fracture this level. Small lesion in the midthoracic spine EXTREMITIES Hypermetabolic lesions in the LEFT femur  Hypermetabolic muscle lesion in the LEFT adductor muscles. IMPRESSION: 1. Multi organ metastatic melanoma with hypermetabolic lesions within the brain, lungs, liver, RIGHT kidney, and peritoneal metastasis. 2. Hypermetabolic adenopathy involving the a RIGHT level 2 cervical lymph node and LEFT hilar node. 3. Hypermetabolic skeletal lesions involving the spine and LEFT femur. Pathologic fracture at L1. Electronically Signed   By: Suzy Bouchard M.D.   On: 02/28/2017 10:13   US Biopsy (liver)  Result Date: 02/21/2017 INDICATION: 56 year old with metastatic disease in the lungs, liver and abdominal cavity. Patient had melanoma 7 years ago and there is concern for local recurrence in the right  upper neck. Tissue diagnosis is needed. EXAM: ULTRASOUND-GUIDED CORE BIOPSY OF RIGHT HEPATIC LESION. MEDICATIONS: None. ANESTHESIA/SEDATION: Moderate (conscious) sedation was employed during this procedure. A total of Versed 2.5 mg and Fentanyl 75 mcg was administered intravenously. Moderate Sedation Time: 20 minutes. The patient's level of consciousness and vital signs were monitored continuously by radiology nursing throughout the procedure under my direct supervision. FLUOROSCOPY TIME:  None COMPLICATIONS: None immediate. PROCEDURE: Informed written consent was obtained from the patient after a thorough discussion of the procedural risks, benefits and alternatives. All questions were addressed. A timeout was performed prior to the initiation of the procedure. Right upper neck and liver were evaluated with ultrasound. Liver was selected for biopsy. The right abdomen was prepped with chlorhexidine and sterile field was created. Skin and soft tissues were anesthetized with 1% lidocaine. 17 gauge coaxial needle was directed into the right hepatic lesion with ultrasound guidance. Total of 5 core biopsies were obtained with an 18 gauge device. Four adequate specimens were placed in formalin. 17 gauge needle was removed without  complication. Bandage placed over the puncture site. FINDINGS: 2.3 cm lesion in the inferior right hepatic lobe was selected for biopsy. Core biopsy needle was identified within the lesion on all occasions. No significant bleeding or hematoma formation following the liver biopsy. Ultrasound images were also obtained of the palpable area in the right upper neck. There is a mildly irregular bilobed hypoechoic lesion in the right upper neck that measures 2.4 x 1.2 x 1.7 cm. IMPRESSION: Successful ultrasound-guided core biopsies of a right hepatic lesion. Irregular nodular lesion in the right upper neck. Reportedly, this lesion is near the previous melanoma resection. Findings concerning for recurrent melanoma in the right neck. Electronically Signed   By: Markus Daft M.D.   On: 02/21/2017 09:29    ASSESSMENT & PLAN:   Metastatic melanoma to liver ([place]) Recurrent metastatic melanoma - multiple metastatic lesions to the liver; bilateral lung nodules; L1 vertebral compression fracture; the foundation one pending.  #Recommend mmunotherapy combination Ipi [3mg /kg]+Nivo [1mg /kg] every 3 weeks and 4 cycles-followed by Nivo every 2 weeks.  #Discussed treatments are palliative not curative; however 60-70% response rates; 20% durable responses.  I discussed the mechanism of action; length of treatments are likely ongoing/based upon the results of the scans. Discussed the potential side effects of immunotherapy including but not limited to diarrhea; skin rash; elevated LFTs/endocrine abnormalities etc.  #Brain metastases- Asymptomatic; 3 lesions-1 approximately 2 cm in size; and other 2 2-4 mm in size.  Stop dexamethasone today-as patient is asymptomatic.  Awaiting radiation to the large lesion starting Monday.  Discussed with Dr. Donella Stade.  # History of thyroid cancer-awaiting TSH.  #Leukocytosis white count is 18-likely secondary to steroids.   # L1 vertebral body lesion- pathologic compression fracture- on  tramadol pain control.  Stable.  Also planned radiation.  Prescription for tramadol given.  Status post Xgeva on 12/04.   #Follow-up in approximately 10 days on 12/21- no labs; follow-up on January 2 labs infusion ; [place].   All questions were answered. The patient knows to call the clinic with any problems, questions or concerns.    Cammie Sickle, MD 03/04/2017 12:11 PM

## 2017-03-04 NOTE — Patient Instructions (Signed)
For any signs and symptoms of diarrhea: Immediately start 80 mg (8 tablets) by mouth daily for 3 days  Reduce by 10 mg for 3 days thereafter:  70 mg (7 tablets) by mouth daily for 3 days  60 mg (6 tablets) by mouth daily for 3 days  50 mg (5 tablets) by mouth daily for 3 days  40 mg (4 tablets) by mouth daily for 3 days  30 mg (3 tablets) by mouth daily for 3 days  20 mg (2 tablets) by mouth daily for 3 days  10 mg (1 tablet) by mouth daily for 3 days 5 mg (1/2 tablet) by mouth daily for 3 days.Nivolumab injection What is this medicine? NIVOLUMAB (nye VOL ue mab) is a monoclonal antibody. It is used to treat melanoma, lung cancer, kidney cancer, head and neck cancer, Hodgkin lymphoma, urothelial cancer, colon cancer, and liver cancer. This medicine may be used for other purposes; ask your health care provider or pharmacist if you have questions. COMMON BRAND NAME(S): Opdivo What should I tell my health care provider before I take this medicine? They need to know if you have any of these conditions: -diabetes -immune system problems -kidney disease -liver disease -lung disease -organ transplant -stomach or intestine problems -thyroid disease -an unusual or allergic reaction to nivolumab, other medicines, foods, dyes, or preservatives -pregnant or trying to get pregnant -breast-feeding How should I use this medicine? This medicine is for infusion into a vein. It is given by a health care professional in a hospital or clinic setting. A special MedGuide will be given to you before each treatment. Be sure to read this information carefully each time. Talk to your pediatrician regarding the use of this medicine in children. While this drug may be prescribed for children as young as 12 years for selected conditions, precautions do apply. Overdosage: If you think you have taken too much of this medicine contact a poison control center or emergency room at once. NOTE: This medicine is  only for you. Do not share this medicine with others. What if I miss a dose? It is important not to miss your dose. Call your doctor or health care professional if you are unable to keep an appointment. What may interact with this medicine? Interactions have not been studied. Give your health care provider a list of all the medicines, herbs, non-prescription drugs, or dietary supplements you use. Also tell them if you smoke, drink alcohol, or use illegal drugs. Some items may interact with your medicine. This list may not describe all possible interactions. Give your health care provider a list of all the medicines, herbs, non-prescription drugs, or dietary supplements you use. Also tell them if you smoke, drink alcohol, or use illegal drugs. Some items may interact with your medicine. What should I watch for while using this medicine? This drug may make you feel generally unwell. Continue your course of treatment even though you feel ill unless your doctor tells you to stop. You may need blood work done while you are taking this medicine. Do not become pregnant while taking this medicine or for 5 months after stopping it. Women should inform their doctor if they wish to become pregnant or think they might be pregnant. There is a potential for serious side effects to an unborn child. Talk to your health care professional or pharmacist for more information. Do not breast-feed an infant while taking this medicine. What side effects may I notice from receiving this medicine? Side effects that  you should report to your doctor or health care professional as soon as possible: -allergic reactions like skin rash, itching or hives, swelling of the face, lips, or tongue -black, tarry stools -blood in the urine -bloody or watery diarrhea -changes in vision -change in sex drive -changes in emotions or moods -chest pain -confusion -cough -decreased appetite -diarrhea -facial flushing -feeling faint or  lightheaded -fever, chills -hair loss -hallucination, loss of contact with reality -headache -irritable -joint pain -loss of memory -muscle pain -muscle weakness -seizures -shortness of breath -signs and symptoms of high blood sugar such as dizziness; dry mouth; dry skin; fruity breath; nausea; stomach pain; increased hunger or thirst; increased urination -signs and symptoms of kidney injury like trouble passing urine or change in the amount of urine -signs and symptoms of liver injury like dark yellow or brown urine; general ill feeling or flu-like symptoms; light-colored stools; loss of appetite; nausea; right upper belly pain; unusually weak or tired; yellowing of the eyes or skin -stiff neck -swelling of the ankles, feet, hands -weight gain Side effects that usually do not require medical attention (report to your doctor or health care professional if they continue or are bothersome): -bone pain -constipation -tiredness -vomiting This list may not describe all possible side effects. Call your doctor for medical advice about side effects. You may report side effects to FDA at 1-800-FDA-1088. Where should I keep my medicine? This drug is given in a hospital or clinic and will not be stored at home. NOTE: This sheet is a summary. It may not cover all possible information. If you have questions about this medicine, talk to your doctor, pharmacist, or health care provider.  2018 Elsevier/Gold Standard (2015-12-18 17:49:34)

## 2017-03-04 NOTE — Assessment & Plan Note (Addendum)
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  #Recommend mmunotherapy combination Ipi [3mg /kg]+Nivo [1mg /kg] every 3 weeks and 4 cycles-followed by Nivo every 2 weeks.  #Discussed treatments are palliative not curative; however 60-70% response rates; 20% durable responses.  I discussed the mechanism of action; length of treatments are likely ongoing/based upon the results of the scans. Discussed the potential side effects of immunotherapy including but not limited to diarrhea; skin rash; elevated LFTs/endocrine abnormalities etc.  #Brain metastases- Asymptomatic; 3 lesions-1 approximately 2 cm in size; and other 2 2-4 mm in size.  Stop dexamethasone today-as patient is asymptomatic.  Awaiting radiation to the large lesion starting Monday.  Discussed with Dr. Donella Stade.  # History of thyroid cancer-awaiting TSH.  #Leukocytosis white count is 18-likely secondary to steroids.   # L1 vertebral body lesion- pathologic compression fracture- on tramadol pain control.  Stable.  Also planned radiation.  Prescription for tramadol given.  Status post Xgeva on 12/04.   #Follow-up in approximately 10 days on 12/21- no labs; follow-up on January 2 labs infusion [place]; [place].

## 2017-03-05 ENCOUNTER — Other Ambulatory Visit (INDEPENDENT_AMBULATORY_CARE_PROVIDER_SITE_OTHER): Payer: Self-pay | Admitting: Vascular Surgery

## 2017-03-05 MED ORDER — CEFAZOLIN SODIUM-DEXTROSE 2-4 GM/100ML-% IV SOLN: 2.0000 g | Freq: Once | INTRAVENOUS | Status: DC

## 2017-03-06 ENCOUNTER — Ambulatory Visit
Admission: RE | Admit: 2017-03-06 | Payer: BC Managed Care – PPO | Source: Ambulatory Visit | Admitting: Vascular Surgery

## 2017-03-06 ENCOUNTER — Telehealth: Payer: Self-pay | Admitting: *Deleted

## 2017-03-06 ENCOUNTER — Encounter: Admission: RE | Payer: Self-pay | Source: Ambulatory Visit

## 2017-03-06 ENCOUNTER — Ambulatory Visit: Payer: BC Managed Care – PPO

## 2017-03-06 SURGERY — PORTA CATH INSERTION
Anesthesia: Moderate Sedation

## 2017-03-06 NOTE — Telephone Encounter (Signed)
Noted  

## 2017-03-06 NOTE — Telephone Encounter (Signed)
-----   Message from Secundino Ginger sent at 03/06/2017 11:13 AM EST ----- Contact: [phone] She went to [place] last night with weakness on left side, did CT scan and it was edema from the largest tumor, so they gave her steroids. She is feeling a lil better, still weakness on left side. She papers from [facility] she wanted to bring by.Shes going to bring to me and I'll fax to you.

## 2017-03-07 ENCOUNTER — Other Ambulatory Visit: Payer: Self-pay | Admitting: *Deleted

## 2017-03-10 ENCOUNTER — Ambulatory Visit
Admission: RE | Admit: 2017-03-10 | Discharge: 2017-03-10 | Disposition: A | Discharge status: Home / Self Care | Payer: BC Managed Care – PPO | Source: Ambulatory Visit | Attending: Radiation Oncology | Admitting: Radiation Oncology

## 2017-03-10 ENCOUNTER — Telehealth: Payer: Self-pay | Admitting: *Deleted

## 2017-03-10 DIAGNOSIS — C7931 Secondary malignant neoplasm of brain: Secondary | ICD-10-CM | POA: Diagnosis not present

## 2017-03-10 NOTE — Telephone Encounter (Signed)
msg sent to [place] for Dr. Lucky Cowboy to r/s pt's port placement.

## 2017-03-10 NOTE — Telephone Encounter (Signed)
-----   Message from Secundino Ginger sent at 03/10/2017  2:01 PM EST ----- This pt called and said she did not get her port placed bc she was in the ER. She would like to get it before 1/2 her next treatment date. She tried to call them herself and they told her that it would have to come thru ya'll

## 2017-03-11 ENCOUNTER — Ambulatory Visit
Admission: RE | Admit: 2017-03-11 | Discharge: 2017-03-11 | Disposition: A | Discharge status: Home / Self Care | Payer: BC Managed Care – PPO | Source: Ambulatory Visit | Attending: Radiation Oncology | Admitting: Radiation Oncology

## 2017-03-11 DIAGNOSIS — C7931 Secondary malignant neoplasm of brain: Secondary | ICD-10-CM | POA: Diagnosis not present

## 2017-03-12 ENCOUNTER — Other Ambulatory Visit (INDEPENDENT_AMBULATORY_CARE_PROVIDER_SITE_OTHER): Payer: Self-pay | Admitting: Vascular Surgery

## 2017-03-12 ENCOUNTER — Ambulatory Visit
Admission: RE | Admit: 2017-03-12 | Discharge: 2017-03-12 | Disposition: A | Discharge status: Home / Self Care | Payer: BC Managed Care – PPO | Source: Ambulatory Visit | Attending: Radiation Oncology | Admitting: Radiation Oncology

## 2017-03-12 DIAGNOSIS — C7931 Secondary malignant neoplasm of brain: Secondary | ICD-10-CM | POA: Diagnosis not present

## 2017-03-13 ENCOUNTER — Ambulatory Visit
Admission: RE | Admit: 2017-03-13 | Discharge: 2017-03-13 | Disposition: A | Discharge status: Home / Self Care | Payer: BC Managed Care – PPO | Source: Ambulatory Visit | Attending: Radiation Oncology | Admitting: Radiation Oncology

## 2017-03-13 DIAGNOSIS — C7931 Secondary malignant neoplasm of brain: Secondary | ICD-10-CM | POA: Diagnosis not present

## 2017-03-13 MED ORDER — CEFAZOLIN SODIUM-DEXTROSE 2-4 GM/100ML-% IV SOLN
2.0000 g | Freq: Once | INTRAVENOUS | Status: AC
  Administered 2017-03-14: 2 g via INTRAVENOUS

## 2017-03-14 ENCOUNTER — Ambulatory Visit
Admission: RE | Admit: 2017-03-14 | Discharge: 2017-03-14 | Disposition: A | Discharge status: Home / Self Care | Payer: BC Managed Care – PPO | Source: Ambulatory Visit | Attending: Radiation Oncology | Admitting: Radiation Oncology

## 2017-03-14 ENCOUNTER — Ambulatory Visit
Admission: RE | Admit: 2017-03-14 | Discharge: 2017-03-14 | Disposition: A | Discharge status: Home / Self Care | Payer: BC Managed Care – PPO | Source: Ambulatory Visit | Attending: Vascular Surgery | Admitting: Vascular Surgery

## 2017-03-14 ENCOUNTER — Other Ambulatory Visit: Payer: Self-pay

## 2017-03-14 ENCOUNTER — Encounter: Payer: Self-pay | Admitting: Emergency Medicine

## 2017-03-14 ENCOUNTER — Inpatient Hospital Stay (HOSPITAL_BASED_OUTPATIENT_CLINIC_OR_DEPARTMENT_OTHER): Payer: BC Managed Care – PPO | Admitting: Internal Medicine

## 2017-03-14 ENCOUNTER — Encounter: Payer: Self-pay | Admitting: Internal Medicine

## 2017-03-14 ENCOUNTER — Encounter
Admission: RE | Disposition: A | Discharge status: Home / Self Care | Payer: Self-pay | Source: Ambulatory Visit | Attending: Vascular Surgery

## 2017-03-14 VITALS — BP 138/87 | HR 93 | Temp 97.8°F | Resp 20 | Wt 143.0 lb

## 2017-03-14 DIAGNOSIS — Z887 Allergy status to serum and vaccine status: Secondary | ICD-10-CM | POA: Diagnosis not present

## 2017-03-14 DIAGNOSIS — Z800 Family history of malignant neoplasm of digestive organs: Secondary | ICD-10-CM | POA: Insufficient documentation

## 2017-03-14 DIAGNOSIS — Z87891 Personal history of nicotine dependence: Secondary | ICD-10-CM | POA: Diagnosis not present

## 2017-03-14 DIAGNOSIS — M899 Disorder of bone, unspecified: Secondary | ICD-10-CM | POA: Diagnosis not present

## 2017-03-14 DIAGNOSIS — D72829 Elevated white blood cell count, unspecified: Secondary | ICD-10-CM

## 2017-03-14 DIAGNOSIS — R531 Weakness: Secondary | ICD-10-CM

## 2017-03-14 DIAGNOSIS — C7931 Secondary malignant neoplasm of brain: Secondary | ICD-10-CM | POA: Diagnosis not present

## 2017-03-14 DIAGNOSIS — Z79899 Other long term (current) drug therapy: Secondary | ICD-10-CM | POA: Diagnosis not present

## 2017-03-14 DIAGNOSIS — C786 Secondary malignant neoplasm of retroperitoneum and peritoneum: Secondary | ICD-10-CM

## 2017-03-14 DIAGNOSIS — Z85850 Personal history of malignant neoplasm of thyroid: Secondary | ICD-10-CM

## 2017-03-14 DIAGNOSIS — E890 Postprocedural hypothyroidism: Secondary | ICD-10-CM | POA: Insufficient documentation

## 2017-03-14 DIAGNOSIS — C434 Malignant melanoma of scalp and neck: Secondary | ICD-10-CM | POA: Diagnosis not present

## 2017-03-14 DIAGNOSIS — D72820 Lymphocytosis (symptomatic): Secondary | ICD-10-CM | POA: Insufficient documentation

## 2017-03-14 DIAGNOSIS — C787 Secondary malignant neoplasm of liver and intrahepatic bile duct: Secondary | ICD-10-CM | POA: Diagnosis not present

## 2017-03-14 DIAGNOSIS — Z803 Family history of malignant neoplasm of breast: Secondary | ICD-10-CM | POA: Insufficient documentation

## 2017-03-14 DIAGNOSIS — Z9889 Other specified postprocedural states: Secondary | ICD-10-CM | POA: Diagnosis not present

## 2017-03-14 DIAGNOSIS — C7901 Secondary malignant neoplasm of right kidney and renal pelvis: Secondary | ICD-10-CM | POA: Diagnosis not present

## 2017-03-14 DIAGNOSIS — E039 Hypothyroidism, unspecified: Secondary | ICD-10-CM | POA: Diagnosis not present

## 2017-03-14 DIAGNOSIS — R918 Other nonspecific abnormal finding of lung field: Secondary | ICD-10-CM | POA: Insufficient documentation

## 2017-03-14 DIAGNOSIS — C7800 Secondary malignant neoplasm of unspecified lung: Secondary | ICD-10-CM | POA: Diagnosis not present

## 2017-03-14 DIAGNOSIS — C229 Malignant neoplasm of liver, not specified as primary or secondary: Secondary | ICD-10-CM

## 2017-03-14 DIAGNOSIS — C7951 Secondary malignant neoplasm of bone: Secondary | ICD-10-CM | POA: Diagnosis not present

## 2017-03-14 HISTORY — PX: PORTA CATH INSERTION: CATH118285

## 2017-03-14 SURGERY — PORTA CATH INSERTION
Anesthesia: Moderate Sedation

## 2017-03-14 MED ORDER — LIDOCAINE-PRILOCAINE 2.5-2.5 % EX CREA: 1.0000 "application " | TOPICAL_CREAM | CUTANEOUS | 4 refills | Status: AC | PRN

## 2017-03-14 MED ORDER — LIDOCAINE HCL (PF) 1 % IJ SOLN
INTRAMUSCULAR | Status: DC | PRN
Start: 2017-03-14 — End: 2017-03-14
  Administered 2017-03-14: 20 mL

## 2017-03-14 MED ORDER — MIDAZOLAM HCL 2 MG/2ML IJ SOLN
INTRAMUSCULAR | Status: DC | PRN
Start: 2017-03-14 — End: 2017-03-14
  Administered 2017-03-14 (×2): 1 mg via INTRAVENOUS
  Administered 2017-03-14: 2 mg via INTRAVENOUS

## 2017-03-14 MED ORDER — FENTANYL CITRATE (PF) 100 MCG/2ML IJ SOLN
INTRAMUSCULAR | Status: AC
  Filled 2017-03-14: qty 2

## 2017-03-14 MED ORDER — FENTANYL CITRATE (PF) 100 MCG/2ML IJ SOLN
INTRAMUSCULAR | Status: DC | PRN
  Administered 2017-03-14: 50 ug via INTRAVENOUS
  Administered 2017-03-14 (×2): 25 ug via INTRAVENOUS

## 2017-03-14 MED ORDER — LIDOCAINE-EPINEPHRINE (PF) 1 %-1:200000 IJ SOLN
INTRAMUSCULAR | Status: AC
  Filled 2017-03-14: qty 30

## 2017-03-14 MED ORDER — MIDAZOLAM HCL 2 MG/2ML IJ SOLN
INTRAMUSCULAR | Status: AC
  Filled 2017-03-14: qty 4

## 2017-03-14 MED ORDER — HEPARIN (PORCINE) IN NACL 2-0.9 UNIT/ML-% IJ SOLN
INTRAMUSCULAR | Status: AC
  Filled 2017-03-14: qty 500

## 2017-03-14 MED ORDER — SODIUM CHLORIDE 0.9 % IV SOLN
INTRAVENOUS | Status: DC
  Administered 2017-03-14: 14:00:00 via INTRAVENOUS

## 2017-03-14 SURGICAL SUPPLY — 8 items
DRAPE INCISE IOBAN 66X45 STRL (DRAPES) ×3 IMPLANT
KIT PORT POWER 8FR ISP CVUE (Miscellaneous) ×3 IMPLANT
NEEDLE ENTRY 21GA 7CM ECHOTIP (NEEDLE) ×3 IMPLANT
PACK ANGIOGRAPHY (CUSTOM PROCEDURE TRAY) ×3 IMPLANT
SET INTRO CAPELLA COAXIAL (SET/KITS/TRAYS/PACK) ×3 IMPLANT
SUT MNCRL AB 4-0 PS2 18 (SUTURE) ×3 IMPLANT
SUT PROLENE 0 CT 1 30 (SUTURE) ×3 IMPLANT
SUTURE VIC 3-0 (SUTURE) ×3 IMPLANT

## 2017-03-14 NOTE — Progress Notes (Signed)
[facility] CONSULT NOTE  Patient Care Team: Dion Body, MD as PCP - General (Family Medicine)  CHIEF COMPLAINTS/PURPOSE OF CONSULTATION:  Multiple liver lesions   Oncology History   # NOV 2018-RECURRENT METASTATIC MELANOMA MULTIPLE LIVER LESIONS/lung lesions [s/p Bx on Nov 30th]; DEc 2018- PET-multiple lung liver bone metastasis.   # Dec 11th,2018- ipi+Nivo  # Dec 1st 2018- Brain mets- 39m [s/p SBRT- dec 21st]; and 2 other 2-4 mm.   # Bone mets- X-geva [12/04]; plan RT   # Hx of Melanoma- dec 2011 [SLNBx- 0/4-Neg;[facility] ]; Hx of thyroid cancer- feb 2001- s/p left throid lobectomy [Iva]; NO RAIU.  Molecular testing: Foundation One pending.        Metastatic melanoma to liver (HCC)     HISTORY OF PRESENTING ILLNESS:  CValia Wingard56y.o.  female with newly diagnosed metastatic melanoma-brain and liver-currently status post ipi+nivo-approximately 10 days ago is here for follow-up.  In the [facility] weakness.  CT scan of the brain noncontrast showed vasogenic edema right frontal lobe; patient started on steroids discharged home.  Patient is currently taking dexamethasone 4 mg twice a day.  Notes to have improvement of her left lower extremity weakness.  Denies any headaches.  No seizures.   Patient is finishing her SBR T to the frontal lesion today.  Patient denies her appetite is good in general. She denies any weight loss.  Patient continues to take tramadol as needed for back pain.  No diarrhea no skin rash.  ROS: A complete 10 point review of system is done which is negative except mentioned above in history of present illness  MEDICAL HISTORY:  Past Medical History:  Diagnosis Date  . Hypothyroidism   . [place] [facility] [place] [place]   Resected from Right neck area with 4 lymph nodes as well.   . [place] [place]   Partial thyroidectomy     SURGICAL HISTORY: Past Surgical History:  Procedure Laterality Date  . BREAST BIOPSY Right 2005   neg  . BREAST BIOPSY Left 07/11/2016   radial scar  . BREAST EXCISIONAL BIOPSY Left 08/06/2016   lumpectomy for radial scar  . BREAST LUMPECTOMY WITH NEEDLE LOCALIZATION Left 08/06/2016   Procedure: BREAST LUMPECTOMY WITH NEEDLE LOCALIZATION;  Surgeon: SLeonie Green MD;  Location: [facility] ORS;  Service: General;  Laterality: Left;  .Marland KitchenMELANOMA EXCISION  2011   ear  . 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 Daughter- 345  Social History   Socioeconomic History  . Marital status: Married    Spouse name: Not on file  . Number of children: Not on file  . Years of education: Not on file  . Highest education level: Not on file  Social Needs  . Financial resource strain: Not on file  . Food insecurity - worry: Not on file  . Food insecurity - inability: Not on file  . Transportation needs - medical: Not on file  . Transportation needs - non-medical: Not on file  Occupational History  . Not on file  Tobacco Use  . Smoking status: Former Smoker    Packs/day: 0.25    Years: 20.00    Pack years: 5.00    Types: Cigarettes    Last attempt to quit: 03/26/1999    Years since quitting: 17.9  . Smokeless  tobacco: Never Used  Substance and Sexual Activity  . Alcohol use: Yes    Comment: WEEKENDS  . Drug use: No  . Sexual activity: Yes  Other Topics Concern  . Not on file  Social History Narrative  . Not on file    FAMILY HISTORY: Brother died of colon. Mom-survived.  Family History  Problem Relation Age of Onset  . Breast cancer Mother 35  . Breast cancer Paternal Aunt   . Colon cancer Brother 2    ALLERGIES:  is allergic to tetanus-diphtheria toxoids td.  MEDICATIONS:  Current Outpatient Medications  Medication Sig Dispense Refill  . atorvastatin (LIPITOR) 40 MG tablet Take 40  mg by mouth every evening.    Marland Kitchen dexamethasone (DECADRON) 4 MG tablet Take 1 tablet by mouth 2 (two) times daily.  0  . levothyroxine (SYNTHROID, LEVOTHROID) 100 MCG tablet Take 100 mcg by mouth daily before breakfast.    . traMADol (ULTRAM) 50 MG tablet Take 1 tablet (50 mg total) by mouth every 6 (six) hours as needed. (Patient taking differently: Take 50 mg by mouth every 6 (six) hours as needed for moderate pain. ) 60 tablet 0  . lidocaine-prilocaine (EMLA) cream Apply 1 application topically as needed. (Patient not taking: Reported on 03/14/2017) 30 g 4   No current facility-administered medications for this visit.    Facility-Administered Medications Ordered in Other Visits  Medication Dose Route Frequency Provider Last Rate Last Dose  . ceFAZolin (ANCEF) IVPB 2g/100 mL premix  2 g Intravenous Once Stegmayer, Kimberly A, PA-C          .  PHYSICAL EXAMINATION: ECOG PERFORMANCE STATUS: 1 - Symptomatic but completely ambulatory  Vitals:   03/14/17 1013  BP: 138/87  Pulse: 93  Resp: 20  Temp: 97.8 F (36.6 C)   Filed Weights   03/14/17 1013  Weight: 143 lb (64.9 kg)    GENERAL: Well-nourished well-developed; Alert, no distress and comfortable.   Accompanied by family. EYES: no pallor or icterus OROPHARYNX: no thrush or ulceration; good dentition  NECK: supple, no masses felt; previous melanoma surgery scar noted level II lymph node approximately 2 cm in size; nontender. LYMPH:  no palpable lymphadenopathy in the axillary or inguinal regions LUNGS: clear to auscultation and  No wheeze or crackles HEART/CVS: regular rate & rhythm and no murmurs; No lower extremity edema ABDOMEN: abdomen soft, non-tender and normal bowel sounds Musculoskeletal:no cyanosis of digits and no clubbing  PSYCH: alert & oriented x 3 with fluent speech NEURO: no focal motor/sensory deficits SKIN:  no rashes or significant lesions  LABORATORY DATA:  I have reviewed the data as listed Lab Results   Component Value Date   WBC 18.6 (H) 03/04/2017   HGB 13.3 03/04/2017   HCT 39.5 03/04/2017   MCV 90.7 03/04/2017   PLT 364 03/04/2017   Recent Labs    02/22/17 1224 03/04/17 1050  NA 133* 131*  K 3.3* 3.4*  CL 97* 98*  CO2 25 24  GLUCOSE 141* 122*  BUN 7 8  CREATININE 0.69 0.59  CALCIUM 9.1 8.3*  GFRNONAA >60 >60  GFRAA >60 >60  PROT 7.4 7.8  ALBUMIN 3.8 3.5  AST 28 27  ALT 39 45  ALKPHOS 132* 151*  BILITOT 0.2* 0.3    RADIOGRAPHIC STUDIES: I have personally reviewed the radiological images as listed and agreed with the findings in the report. Mr Jeri Cos Wo Contrast  Addendum Date: 02/22/2017   ADDENDUM REPORT: 02/22/2017 09:58 ADDENDUM: These  results were called by telephone by myself at the time of interpretation on 02/22/2017 at 9:52 am to Dr. Mike Gip , who verbally acknowledged these results. Electronically Signed   By: Staci Righter M.D.   On: 02/22/2017 09:58   Result Date: 02/22/2017 CLINICAL DATA:  Metastatic melanoma.  Staging. EXAM: MRI HEAD WITHOUT AND WITH CONTRAST TECHNIQUE: Multiplanar, multiecho pulse sequences of the brain and surrounding structures were obtained without and with intravenous contrast. CONTRAST:  24m MULTIHANCE GADOBENATE DIMEGLUMINE 529 MG/ML IV SOLN COMPARISON:  No prior cranial imaging. FINDINGS: Brain: Multiple intracranial metastatic deposits are present. The largest lesion is in the medial RIGHT frontal lobe, forward of the motor strip. On post infusion imaging, the lesion measures 17 x 23 x 21 mm. Smaller metastatic deposits are noted in the LEFT anterior parietal parasagittal cortex, 4 mm diameter, and a 2 mm lesion, subependymal location, LEFT inferior, medial, and posterior temporal lobe. Only the RIGHT frontal lesion demonstrates significant vasogenic edema. None of the lesions show T1 or T2 shortening, suggesting they may represent an amelanotic form of melanoma. The lesions are therefore nonhemorrhagic as well. Central necrosis  accompanies the RIGHT frontal lesion. Normal cerebral volume, without midline shift, hydrocephalus, or intracranial hemorrhage. No extra-axial fluid collections. Vascular: Normal flow voids. Skull and upper cervical spine: Normal marrow signal. Sinuses/Orbits: No layering fluid or orbital masses. Other: There appears to be a large level 5 nodal mass in the RIGHT neck, incompletely evaluated. See coronal image 17 series 13. IMPRESSION: Three enhancing intracranial lesions consistent with metastatic disease. Lack of hemorrhage/ T1 shortening suggest these represent amelanotic type of melanoma, if indeed that is the primary. Significant vasogenic edema is associated with the RIGHT posterior frontal lesion, along with central necrosis. No significant shift. Incompletely evaluated RIGHT neck nodal mass. CT of the neck with contrast recommended for staging. A call has been placed to the ordering provider. Electronically Signed: By: JStaci RighterM.D. On: 02/22/2017 09:21   Nm Pet Image Initial (pi) Whole Body  Result Date: 02/28/2017 CLINICAL DATA:  Initial treatment strategy for malignant melanoma. EXAM: NUCLEAR MEDICINE PET WHOLE BODY TECHNIQUE: 13.6 mCi F-18 FDG was injected intravenously. Full-ring PET imaging was performed from the vertex to the feet after the radiotracer. CT data was obtained and used for attenuation correction and anatomic localization. FASTING BLOOD GLUCOSE:  Value: 102 mg/dl COMPARISON:  Brain MRI 02/22/2017, CT abdomen pelvis 02/12/2017 FINDINGS: HEAD/NECK Focal metabolic activity in the high RIGHT frontal lobe corresponds to 17 mm lesion on CT and enhancing lesion on MRI. Lesion is intense with metabolic activity above background brain activity Hypermetabolic new RIGHT level 2 lymph node measuring 13 mm SUV max equal 17.7. CHEST Innumerable bilateral round hypermetabolic pulmonary nodules of varying size. Largest nodule measures 22 mm in the RIGHT middle lobe with SUV max equal 9.3. Example  nodule in the LEFT lower lobe measures 14 mm (image 158, series 3) with SUV max equal 9.6. Hypermetabolic LEFT hilar lymph node noted ABDOMEN/PELVIS Approximately 6 hypermetabolic lesions within the liver. Lesions measure 1-2 cm. Example lesion in the central LEFT hepatic lobe measures 17 mm SUV max equal 15. Lesion inferior RIGHT hepatic lobe with SUV max equals 181.4Hypermetabolic lesion within the cortex of the RIGHT kidney extends partially exophytic measuring 15 mm SUV max equal 14.8. Two hypermetabolic mesenteric masses. The more superior centrally in the lower abdomen measures 21 mm (image 260, series 3 )with SUV max 19.4. Second lesion just superior to the bladder measures 3.0 cm (  image 284, series 3 with intense radiotracer activity (SUV max equal 30). SKELETON Several discrete hypermetabolic skeletal metastasis. Two lesions in the LEFT femur with SUV max equal 10.0. Lytic lesion in the L1 vertebral body with SUV max equal 12.3. Pathologic fracture this level. Small lesion in the midthoracic spine EXTREMITIES Hypermetabolic lesions in the LEFT femur Hypermetabolic muscle lesion in the LEFT adductor muscles. IMPRESSION: 1. Multi organ metastatic melanoma with hypermetabolic lesions within the brain, lungs, liver, RIGHT kidney, and peritoneal metastasis. 2. Hypermetabolic adenopathy involving the a RIGHT level 2 cervical lymph node and LEFT hilar node. 3. Hypermetabolic skeletal lesions involving the spine and LEFT femur. Pathologic fracture at L1. Electronically Signed   By: Suzy Bouchard M.D.   On: 02/28/2017 10:13   US Biopsy (liver)  Result Date: 02/21/2017 INDICATION: 56 year old with metastatic disease in the lungs, liver and abdominal cavity. Patient had melanoma 7 years ago and there is concern for local recurrence in the right upper neck. Tissue diagnosis is needed. EXAM: ULTRASOUND-GUIDED CORE BIOPSY OF RIGHT HEPATIC LESION. MEDICATIONS: None. ANESTHESIA/SEDATION: Moderate (conscious)  sedation was employed during this procedure. A total of Versed 2.5 mg and Fentanyl 75 mcg was administered intravenously. Moderate Sedation Time: 20 minutes. The patient's level of consciousness and vital signs were monitored continuously by radiology nursing throughout the procedure under my direct supervision. FLUOROSCOPY TIME:  None COMPLICATIONS: None immediate. PROCEDURE: Informed written consent was obtained from the patient after a thorough discussion of the procedural risks, benefits and alternatives. All questions were addressed. A timeout was performed prior to the initiation of the procedure. Right upper neck and liver were evaluated with ultrasound. Liver was selected for biopsy. The right abdomen was prepped with chlorhexidine and sterile field was created. Skin and soft tissues were anesthetized with 1% lidocaine. 17 gauge coaxial needle was directed into the right hepatic lesion with ultrasound guidance. Total of 5 core biopsies were obtained with an 18 gauge device. Four adequate specimens were placed in formalin. 17 gauge needle was removed without complication. Bandage placed over the puncture site. FINDINGS: 2.3 cm lesion in the inferior right hepatic lobe was selected for biopsy. Core biopsy needle was identified within the lesion on all occasions. No significant bleeding or hematoma formation following the liver biopsy. Ultrasound images were also obtained of the palpable area in the right upper neck. There is a mildly irregular bilobed hypoechoic lesion in the right upper neck that measures 2.4 x 1.2 x 1.7 cm. IMPRESSION: Successful ultrasound-guided core biopsies of a right hepatic lesion. Irregular nodular lesion in the right upper neck. Reportedly, this lesion is near the previous melanoma resection. Findings concerning for recurrent melanoma in the right neck. Electronically Signed   By: Markus Daft M.D.   On: 02/21/2017 09:29    ASSESSMENT & PLAN:   Metastatic melanoma to liver  ([place]) Recurrent metastatic melanoma - multiple metastatic lesions to the liver; bilateral lung nodules; L1 vertebral compression fracture; the foundation one pending.  # s/p Cycle #1 combination Ipi [39m/kg]+Nivo [140mkg] every 3 weeks; tolerated treatment extremely well.  #Brain metastases-  3 lesions-1 approximately 2 cm in size-On SBRT on larger lesion.  On steroids-see discussion below.   # Left foot weakness- sec to brain met- improved; cut steroids to 2 mg BID.  However advised the patient to go back to 4 mg twice a day steroids if she noticed to have any new symptoms/or worsening left lower extremity weakness.  # History of thyroid cancer-TSH- normal.   #  L1 vertebral body lesion- pathologic compression fracture- on tramadol pain control. Stable.  Also planned radiation.  Prescription for tramadol 1/TID given.  Status post Xgeva on 12/04.   #  follow-up on January 2 labs infusion [place]; [place].   All questions were answered. The patient knows to call the clinic with any problems, questions or concerns.    Cammie Sickle, MD 03/14/2017 12:53 PM

## 2017-03-14 NOTE — H&P (Signed)
[facility] History & Physical Update  The patient was interviewed and re-examined.  The patient's previous History and Physical has been reviewed and is unchanged.  She has recurrent melanoma with both liver and lung lesions.  She will require chemotherapy and therefore requires appropriate IV access.  There is no change in the plan of care. We plan to proceed with the scheduled procedure.  Hortencia Pilar, MD  03/14/2017, 2:33 PM

## 2017-03-14 NOTE — Op Note (Signed)
OPERATIVE NOTE   PROCEDURE: 1. Placement of a right IJ Infuse-a-Port  PRE-OPERATIVE DIAGNOSIS: Metastatic melanoma  POST-OPERATIVE DIAGNOSIS: Same  SURGEON: Katha Cabal M.D.  ANESTHESIA: Conscious sedation was administered under my direct supervision by the interventional radiology RN. IV Versed plus fentanyl were utilized. Continuous ECG, pulse oximetry and blood pressure was monitored throughout the entire procedure. Conscious sedation was for a total of 33 minutes.  ESTIMATED BLOOD LOSS: Minimal   FINDING(S): 1.  Patent vein  SPECIMEN(S): None  INDICATIONS:   Tanya Vega is a 56 y.o. female who presents with metastatic melanoma.  DESCRIPTION: After obtaining full informed written consent, the patient was brought back to the special procedure suite and placed in the supine position. The patient's right neck and chest wall are prepped and draped in sterile fashion. Appropriate timeout was called.  Ultrasound is placed in a sterile sleeve, ultrasound is utilized to avoid vascular injury as well as secondary to lack of appropriate landmarks. The right internal jugular vein is identified. It is echolucent and homogeneous as well as easily compressible indicating patency. An image is recorded for the permanent record.  Access to the vein with a micropuncture needle is done under direct ultrasound visualization.  1% lidocaine is infiltrated into the soft tissue at the base of the neck as well as on the chest wall.  Under direct ultrasound visualization a micro-needle is inserted into the vein followed by the micro-wire. Micro-sheath was then advanced and a J wire is inserted without difficulty under fluoroscopic guidance. A small counterincision was created at the wire insertion site. A transverse incision is created 2 fingerbreadths below the scapula and a pocket is fashioned using both blunt and sharp dissection. The pocket is tested for appropriate size with the hub of the  Infuse-a-Port. The tunneling device is then used to pull the intravascular portion of the catheter from the pocket to the neck counterincision.  Dilator and peel-away sheath were then inserted over the wire and the wire is removed. Catheter is then advanced into the venous system without difficulty. Peel-away sheath was then removed.  Catheter is then positioned under fluoroscopic guidance at the atrial caval junction. It is then transected connected to the hub and the hope is slipped into the subcutaneous pocket on the chest wall. The hub was then accessed percutaneously and aspirates easily and flushes well and is flushed with 30 cc of heparinized saline. The pocket incision is then closed in layers using interrupted 3-0 Vicryl for the subcutaneous tissues and 4-0 Monocryl subcuticular for skin closure. Dermabond is applied. The neck counterincision was closed with 4-0 Monocryl subcuticular and Dermabond as well.  The patient tolerated the procedure well and there were no immediate complications.  COMPLICATIONS: None  CONDITION: Unchanged  Katha Cabal M.D. [facility] Office: [phone]   03/14/2017, 3:11 PM

## 2017-03-14 NOTE — Discharge Instructions (Signed)
Implanted Port Insertion, Care After °This sheet gives you information about how to care for yourself after your procedure. Your health care provider may also give you more specific instructions. If you have problems or questions, contact your health care provider. °What can I expect after the procedure? °After your procedure, it is common to have: °· Discomfort at the port insertion site. °· Bruising on the skin over the port. This should improve over 3-4 days. ° °Follow these instructions at home: °Port care °· After your port is placed, you will get a manufacturer's information card. The card has information about your port. Keep this card with you at all times. °· Take care of the port as told by your health care provider. Ask your health care provider if you or a family member can get training for taking care of the port at home. A home health care nurse may also take care of the port. °· Make sure to remember what type of port you have. °Incision care °· Follow instructions from your health care provider about how to take care of your port insertion site. Make sure you: °? Wash your hands with soap and water before you change your bandage (dressing). If soap and water are not available, use hand sanitizer. °? Change your dressing as told by your health care provider. °? Leave stitches (sutures), skin glue, or adhesive strips in place. These skin closures may need to stay in place for 2 weeks or longer. If adhesive strip edges start to loosen and curl up, you may trim the loose edges. Do not remove adhesive strips completely unless your health care provider tells you to do that. °· Check your port insertion site every day for signs of infection. Check for: °? More redness, swelling, or pain. °? More fluid or blood. °? Warmth. °? Pus or a bad smell. °General instructions °· Do not take baths, swim, or use a hot tub until your health care provider approves. °· Do not lift anything that is heavier than 10 lb (4.5  kg) for a week, or as told by your health care provider. °· Ask your health care provider when it is okay to: °? Return to work or school. °? Resume usual physical activities or sports. °· Do not drive for 24 hours if you were given a medicine to help you relax (sedative). °· Take over-the-counter and prescription medicines only as told by your health care provider. °· Wear a medical alert bracelet in case of an emergency. This will tell any health care providers that you have a port. °· Keep all follow-up visits as told by your health care provider. This is important. °Contact a health care provider if: °· You cannot flush your port with saline as directed, or you cannot draw blood from the port. °· You have a fever or chills. °· You have more redness, swelling, or pain around your port insertion site. °· You have more fluid or blood coming from your port insertion site. °· Your port insertion site feels warm to the touch. °· You have pus or a bad smell coming from the port insertion site. °Get help right away if: °· You have chest pain or shortness of breath. °· You have bleeding from your port that you cannot control. °Summary °· Take care of the port as told by your health care provider. °· Change your dressing as told by your health care provider. °· Keep all follow-up visits as told by your health care provider. °  This information is not intended to replace advice given to you by your health care provider. Make sure you discuss any questions you have with your health care provider. °Document Released: 12/30/2012 Document Revised: 01/31/2016 Document Reviewed: 01/31/2016 °Elsevier Interactive Patient Education © 2017 [employer] °Moderate Conscious Sedation, Adult, Care After °These instructions provide you with information about caring for yourself after your procedure. Your health care provider may also give you more specific instructions. Your treatment has been planned according to current medical  practices, but problems sometimes occur. Call your health care provider if you have any problems or questions after your procedure. °What can I expect after the procedure? °After your procedure, it is common: °· To feel sleepy for several hours. °· To feel clumsy and have poor balance for several hours. °· To have poor judgment for several hours. °· To vomit if you eat too soon. ° °Follow these instructions at home: °For at least 24 hours after the procedure: ° °· Do not: °? Participate in activities where you could fall or become injured. °? Drive. °? Use heavy machinery. °? Drink alcohol. °? Take sleeping pills or medicines that cause drowsiness. °? Make important decisions or sign legal documents. °? Take care of children on your own. °· Rest. °Eating and drinking °· Follow the diet recommended by your health care provider. °· If you vomit: °? Drink water, juice, or soup when you can drink without vomiting. °? Make sure you have little or no nausea before eating solid foods. °General instructions °· Have a responsible adult stay with you until you are awake and alert. °· Take over-the-counter and prescription medicines only as told by your health care provider. °· If you smoke, do not smoke without supervision. °· Keep all follow-up visits as told by your health care provider. This is important. °Contact a health care provider if: °· You keep feeling nauseous or you keep vomiting. °· You feel light-headed. °· You develop a rash. °· You have a fever. °Get help right away if: °· You have trouble breathing. °This information is not intended to replace advice given to you by your health care provider. Make sure you discuss any questions you have with your health care provider. °Document Released: 12/30/2012 Document Revised: 08/14/2015 Document Reviewed: 07/01/2015 °Elsevier Interactive Patient Education © 2018 [employer] Inc. ° °

## 2017-03-14 NOTE — Assessment & Plan Note (Addendum)
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  # s/p Cycle #1 combination Ipi [51m/kg]+Nivo [13mkg] every 3 weeks; tolerated treatment extremely well.  #Brain metastases-  3 lesions-1 approximately 2 cm in size-On SBRT on larger lesion.  On steroids-see discussion below.   # Left foot weakness- sec to brain met- improved; cut steroids to 2 mg BID.  However advised the patient to go back to 4 mg twice a day steroids if she noticed to have any new symptoms/or worsening left lower extremity weakness.  # History of thyroid cancer-TSH- normal.   # L1 vertebral body lesion- pathologic compression fracture- on tramadol pain control. Stable.  Also planned radiation.  Prescription for tramadol 1/TID given.  Status post Xgeva on 12/04.   #  follow-up on January 2 labs infusion [place]; [place]

## 2017-03-16 ENCOUNTER — Encounter: Payer: Self-pay | Admitting: Vascular Surgery

## 2017-03-19 ENCOUNTER — Ambulatory Visit
Admission: RE | Admit: 2017-03-19 | Discharge: 2017-03-19 | Disposition: A | Discharge status: Home / Self Care | Payer: BC Managed Care – PPO | Source: Ambulatory Visit | Attending: Radiation Oncology | Admitting: Radiation Oncology

## 2017-03-19 DIAGNOSIS — C7931 Secondary malignant neoplasm of brain: Secondary | ICD-10-CM | POA: Diagnosis not present

## 2017-03-20 ENCOUNTER — Ambulatory Visit
Admission: RE | Admit: 2017-03-20 | Discharge: 2017-03-20 | Disposition: A | Discharge status: Home / Self Care | Payer: BC Managed Care – PPO | Source: Ambulatory Visit | Attending: Radiation Oncology | Admitting: Radiation Oncology

## 2017-03-20 DIAGNOSIS — C7931 Secondary malignant neoplasm of brain: Secondary | ICD-10-CM | POA: Diagnosis not present

## 2017-03-21 ENCOUNTER — Ambulatory Visit
Admission: RE | Admit: 2017-03-21 | Discharge: 2017-03-21 | Disposition: A | Discharge status: Home / Self Care | Payer: BC Managed Care – PPO | Source: Ambulatory Visit | Attending: Radiation Oncology | Admitting: Radiation Oncology

## 2017-03-21 DIAGNOSIS — C7931 Secondary malignant neoplasm of brain: Secondary | ICD-10-CM | POA: Diagnosis not present

## 2017-03-24 ENCOUNTER — Ambulatory Visit
Admission: RE | Admit: 2017-03-24 | Discharge: 2017-03-24 | Disposition: A | Discharge status: Home / Self Care | Payer: BC Managed Care – PPO | Source: Ambulatory Visit | Attending: Radiation Oncology | Admitting: Radiation Oncology

## 2017-03-24 DIAGNOSIS — C7931 Secondary malignant neoplasm of brain: Secondary | ICD-10-CM | POA: Diagnosis not present

## 2017-03-26 ENCOUNTER — Encounter: Payer: Self-pay | Admitting: Internal Medicine

## 2017-03-26 ENCOUNTER — Inpatient Hospital Stay: Payer: BC Managed Care – PPO | Attending: Internal Medicine | Admitting: Internal Medicine

## 2017-03-26 ENCOUNTER — Other Ambulatory Visit: Payer: Self-pay

## 2017-03-26 ENCOUNTER — Inpatient Hospital Stay: Payer: BC Managed Care – PPO

## 2017-03-26 ENCOUNTER — Encounter (INDEPENDENT_AMBULATORY_CARE_PROVIDER_SITE_OTHER): Payer: Self-pay

## 2017-03-26 ENCOUNTER — Ambulatory Visit
Admission: RE | Admit: 2017-03-26 | Discharge: 2017-03-26 | Disposition: A | Discharge status: Home / Self Care | Payer: BC Managed Care – PPO | Source: Ambulatory Visit | Attending: Radiation Oncology | Admitting: Radiation Oncology

## 2017-03-26 VITALS — BP 137/89 | HR 93 | Temp 97.8°F | Resp 20 | Ht 64.0 in | Wt 143.4 lb

## 2017-03-26 DIAGNOSIS — C786 Secondary malignant neoplasm of retroperitoneum and peritoneum: Secondary | ICD-10-CM | POA: Insufficient documentation

## 2017-03-26 DIAGNOSIS — C7800 Secondary malignant neoplasm of unspecified lung: Secondary | ICD-10-CM | POA: Diagnosis not present

## 2017-03-26 DIAGNOSIS — Z87891 Personal history of nicotine dependence: Secondary | ICD-10-CM | POA: Diagnosis not present

## 2017-03-26 DIAGNOSIS — C787 Secondary malignant neoplasm of liver and intrahepatic bile duct: Secondary | ICD-10-CM

## 2017-03-26 DIAGNOSIS — C434 Malignant melanoma of scalp and neck: Secondary | ICD-10-CM

## 2017-03-26 DIAGNOSIS — C7931 Secondary malignant neoplasm of brain: Secondary | ICD-10-CM | POA: Insufficient documentation

## 2017-03-26 DIAGNOSIS — Z85850 Personal history of malignant neoplasm of thyroid: Secondary | ICD-10-CM | POA: Diagnosis not present

## 2017-03-26 DIAGNOSIS — R000 Tachycardia, unspecified: Secondary | ICD-10-CM | POA: Diagnosis not present

## 2017-03-26 DIAGNOSIS — E876 Hypokalemia: Secondary | ICD-10-CM | POA: Insufficient documentation

## 2017-03-26 DIAGNOSIS — Z5112 Encounter for antineoplastic immunotherapy: Secondary | ICD-10-CM | POA: Diagnosis present

## 2017-03-26 DIAGNOSIS — Z79899 Other long term (current) drug therapy: Secondary | ICD-10-CM | POA: Diagnosis not present

## 2017-03-26 DIAGNOSIS — C7901 Secondary malignant neoplasm of right kidney and renal pelvis: Secondary | ICD-10-CM | POA: Diagnosis not present

## 2017-03-26 DIAGNOSIS — Z923 Personal history of irradiation: Secondary | ICD-10-CM | POA: Diagnosis not present

## 2017-03-26 DIAGNOSIS — C7951 Secondary malignant neoplasm of bone: Secondary | ICD-10-CM

## 2017-03-26 DIAGNOSIS — R1310 Dysphagia, unspecified: Secondary | ICD-10-CM | POA: Insufficient documentation

## 2017-03-26 LAB — COMPREHENSIVE METABOLIC PANEL
ALBUMIN: 3.4 g/dL — AB (ref 3.5–5.0)
ALT: 32 U/L (ref 14–54)
ANION GAP: 10 (ref 5–15)
AST: 22 U/L (ref 15–41)
Alkaline Phosphatase: 117 U/L (ref 38–126)
BUN: 11 mg/dL (ref 6–20)
CALCIUM: 8.4 mg/dL — AB (ref 8.9–10.3)
CHLORIDE: 99 mmol/L — AB (ref 101–111)
CO2: 23 mmol/L (ref 22–32)
Creatinine, Ser: 0.49 mg/dL (ref 0.44–1.00)
GFR calc non Af Amer: >60 mL/min (ref >60)
GLUCOSE: 130 mg/dL — AB (ref 65–99)
Potassium: 3.8 mmol/L (ref 3.5–5.1)
SODIUM: 132 mmol/L — AB (ref 135–145)
Total Bilirubin: 0.5 mg/dL (ref 0.3–1.2)
Total Protein: 6.8 g/dL (ref 6.5–8.1)

## 2017-03-26 LAB — CBC WITH DIFFERENTIAL/PLATELET
BASOS PCT: 0 %
Basophils Absolute: 0.1 10*3/uL (ref 0–0.1)
EOS ABS: 0 10*3/uL (ref 0–0.7)
EOS PCT: 0 %
HCT: 37.1 % (ref 35.0–47.0)
HEMOGLOBIN: 12.4 g/dL (ref 12.0–16.0)
Lymphocytes Relative: 7 %
Lymphs Abs: 1.2 10*3/uL (ref 1.0–3.6)
MCH: 30.2 pg (ref 26.0–34.0)
MCHC: 33.5 g/dL (ref 32.0–36.0)
MCV: 90.2 fL (ref 80.0–100.0)
Monocytes Absolute: 0.8 10*3/uL (ref 0.2–0.9)
Monocytes Relative: 5 %
NEUTROS PCT: 88 %
Neutro Abs: 14.4 10*3/uL — ABNORMAL HIGH (ref 1.4–6.5)
PLATELETS: 291 10*3/uL (ref 150–440)
RBC: 4.12 MIL/uL (ref 3.80–5.20)
RDW: 13 % (ref 11.5–14.5)
WBC: 16.4 10*3/uL — AB (ref 3.6–11.0)

## 2017-03-26 MED ORDER — SODIUM CHLORIDE 0.9% FLUSH
10.0000 mL | INTRAVENOUS | Status: DC | PRN
  Filled 2017-03-26: qty 10

## 2017-03-26 MED ORDER — SODIUM CHLORIDE 0.9 % IV SOLN
1.0000 mg/kg | Freq: Once | INTRAVENOUS | Status: AC
  Administered 2017-03-26: 70 mg via INTRAVENOUS
  Filled 2017-03-26: qty 7

## 2017-03-26 MED ORDER — SODIUM CHLORIDE 0.9 % IV SOLN
200.0000 mg | Freq: Once | INTRAVENOUS | Status: AC
  Administered 2017-03-26: 200 mg via INTRAVENOUS
  Filled 2017-03-26: qty 40

## 2017-03-26 MED ORDER — HEPARIN SOD (PORK) LOCK FLUSH 100 UNIT/ML IV SOLN
500.0000 [IU] | Freq: Once | INTRAVENOUS | Status: AC | PRN
  Administered 2017-03-26: 500 [IU]
  Filled 2017-03-26: qty 5

## 2017-03-26 MED ORDER — SODIUM CHLORIDE 0.9 % IV SOLN
Freq: Once | INTRAVENOUS | Status: AC
  Administered 2017-03-26: 13:00:00 via INTRAVENOUS
  Filled 2017-03-26: qty 1000

## 2017-03-26 NOTE — Progress Notes (Signed)
[place] CONSULT NOTE  Patient Care Team: Dion Body, MD as PCP - General (Family Medicine)  CHIEF COMPLAINTS/PURPOSE OF CONSULTATION:  Multiple liver lesions   Oncology History   # NOV 2018-RECURRENT METASTATIC MELANOMA MULTIPLE LIVER LESIONS/lung lesions [s/p Bx on Nov 30th]; DEc 2018- PET-multiple lung liver bone metastasis.   # Dec 11th,2018- ipi+Nivo  # Dec 1st 2018- Brain mets- 86m [s/p SBRT- dec 21st]; and 2 other 2-4 mm.   # Bone mets- X-geva [12/04]; plan RT   # Hx of Melanoma- dec 2011 [SLNBx- 0/4-Neg;[facility] ]; Hx of thyroid cancer- feb 2001- s/p left throid lobectomy [[place]]; NO RAIU.  Molecular testing: Foundation One pending.        Metastatic melanoma to liver (HCC)     HISTORY OF PRESENTING ILLNESS:  Tanya [id number].  female with newly diagnosed metastatic melanoma-brain and liver-currently status post ipi+nivo-approximately  3 weeks ago is here for follow-up.   Patient is status post SBR T to the brain lesion; currently getting her radiation to the back.  Significant pain improvement of back pain.  She is currently on tapering dose of steroids.  Patient denies any further weakness of her left lower extremity.  Denies any other headaches or seizures.  Patient denies her appetite is good in general. She denies any weight loss.  Denies any cough or fatigue. No diarrhea no skin rash.  ROS: A complete 10 point review of system is done which is negative except mentioned above in history of present illness  MEDICAL HISTORY:  Past Medical History:  Diagnosis Date  . Hypothyroidism   . Melanoma of skin [place]   Resected from Right neck area with 4 lymph nodes as well.   . [place] [place]   Partial thyroidectomy    SURGICAL HISTORY: Past Surgical History:  Procedure Laterality Date  . BREAST BIOPSY Right 2005   neg  . BREAST BIOPSY Left 07/11/2016   radial scar  . BREAST EXCISIONAL BIOPSY Left 08/06/2016    lumpectomy for radial scar  . BREAST LUMPECTOMY WITH NEEDLE LOCALIZATION Left 08/06/2016   Procedure: BREAST LUMPECTOMY WITH NEEDLE LOCALIZATION;  Surgeon: SLeonie Green MD;  Location: [facility] ORS;  Service: General;  Laterality: Left;  .Marland KitchenMELANOMA EXCISION  2011   ear  . PORTA CATH INSERTION N/A 03/14/2017   Procedure: PORTA CATH INSERTION;  Surgeon: SKatha Cabal MD;  [facility];  Service: Cardiovascular;  Laterality: N/A;  . 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 Daughter- 356  Social History   Socioeconomic History  . Marital status: Married    Spouse name: Not on file  . Number of children: Not on file  . Years of education: Not on file  . Highest education level: Not on file  Social Needs  . Financial resource strain: Not on file  . Food insecurity - worry: Not on file  . Food insecurity - inability: Not on file  . Transportation needs - medical: Not on file  . Transportation needs - non-medical: Not on file  Occupational History  . Not on file  Tobacco Use  . Smoking status: Former Smoker    Packs/day: 0.25    Years: 20.00    Pack years: 5.00    Types: Cigarettes    Last attempt to quit: 03/26/1999    Years since quitting: 18.0  . Smokeless tobacco: Never Used  Substance and Sexual Activity  . Alcohol use: Yes    Comment: WEEKENDS  . Drug use: No  . Sexual activity: Yes  Other Topics Concern  . Not on file  Social History Narrative  . Not on file    FAMILY HISTORY: Brother died of colon. Mom-survived.  Family History  Problem Relation Age of Onset  . Breast cancer Mother 56  . Breast cancer Paternal Aunt   . Colon cancer Brother 33    ALLERGIES:  is allergic to tetanus-diphtheria toxoids td.  MEDICATIONS:  Current Outpatient Medications  Medication Sig Dispense Refill  . atorvastatin (LIPITOR) 40 MG tablet Take 40 mg by mouth every  evening.    Marland Kitchen dexamethasone (DECADRON) 4 MG tablet Take 0.5 tablets by mouth 2 (two) times daily.   0  . levothyroxine (SYNTHROID, LEVOTHROID) 100 MCG tablet Take 100 mcg by mouth daily before breakfast.    . lidocaine-prilocaine (EMLA) cream Apply 1 application topically as needed. 30 g 4  . traMADol (ULTRAM) 50 MG tablet Take 1 tablet (50 mg total) by mouth every 6 (six) hours as needed. (Patient not taking: Reported on 03/26/2017) 60 tablet 0   No current facility-administered medications for this visit.    Facility-Administered Medications Ordered in Other Visits  Medication Dose Route Frequency Provider Last Rate Last Dose  . heparin lock flush 100 unit/mL  500 Units Intracatheter Once PRN Cammie Sickle, MD      . ipilimumab (YERVOY) 200 mg in sodium chloride 0.9 % 100 mL chemo infusion  200 mg Intravenous Once Charlaine Dalton R, MD   200 mg at 03/26/17 1412  . sodium chloride flush (NS) 0.9 % injection 10 mL  10 mL Intracatheter PRN Cammie Sickle, MD          .  PHYSICAL EXAMINATION: ECOG PERFORMANCE STATUS: 1 - Symptomatic but completely ambulatory  Vitals:   03/26/17 1128  BP: 137/89  Pulse: 93  Resp: 20  Temp: 97.8 F (36.6 C)   Filed Weights   03/26/17 1128  Weight: 143 lb 6.4 oz (65 kg)    GENERAL: Well-nourished well-developed; Alert, no distress and comfortable.   Accompanied by family. EYES: no pallor or icterus OROPHARYNX: no thrush or ulceration; good dentition  NECK: supple, no masses felt; previous melanoma surgery scar noted level II lymph node approximately 2 cm in size; nontender. LYMPH:  no palpable lymphadenopathy in the axillary or inguinal regions LUNGS: clear to auscultation and  No wheeze or crackles HEART/CVS: regular rate & rhythm and no murmurs; No lower extremity edema ABDOMEN: abdomen soft, non-tender and normal bowel sounds Musculoskeletal:no cyanosis of digits and no clubbing  PSYCH: alert & oriented x 3 with fluent  speech NEURO: no focal motor/sensory deficits SKIN:  no rashes or significant lesions  LABORATORY DATA:  I have reviewed the data as listed Lab Results  Component Value Date   WBC 16.4 (H) 03/26/2017   HGB 12.4 03/26/2017   HCT 37.1 03/26/2017   MCV 90.2 03/26/2017   PLT 291 03/26/2017   Recent Labs    02/22/17 1224 03/04/17 1050 03/26/17 1027  NA 133* 131* 132*  K 3.3* 3.4* 3.8  CL 97* 98* 99*  CO2 _0 GLUCOSE 141* 122* 130*  BUN _1 CREATININE 0.69 0.59 0.49  CALCIUM 9.1 8.3* 8.4*  GFRNONAA >60 >60 >60  GFRAA >60 >60 >60  PROT 7.4 7.8 6.8  ALBUMIN 3.8 3.5 3.4*  AST 28 27 22  ALT 39 45 32  ALKPHOS 132* 151* 117  BILITOT 0.2* 0.3 0.5    RADIOGRAPHIC STUDIES: I have personally reviewed the radiological images as listed and agreed with the findings in the report. Nm Pet Image Initial (pi) Whole Body  Result Date: 02/28/2017 CLINICAL DATA:  Initial treatment strategy for malignant melanoma. EXAM: NUCLEAR MEDICINE PET WHOLE BODY TECHNIQUE: 13.6 mCi F-18 FDG was injected intravenously. Full-ring PET imaging was performed from the vertex to the feet after the radiotracer. CT data was obtained and used for attenuation correction and anatomic localization. FASTING BLOOD GLUCOSE:  Value: 102 mg/dl COMPARISON:  Brain MRI 02/22/2017, CT abdomen pelvis 02/12/2017 FINDINGS: HEAD/NECK Focal metabolic activity in the high RIGHT frontal lobe corresponds to 17 mm lesion on CT and enhancing lesion on MRI. Lesion is intense with metabolic activity above background brain activity Hypermetabolic new RIGHT level 2 lymph node measuring 13 mm SUV max equal 17.7. CHEST Innumerable bilateral round hypermetabolic pulmonary nodules of varying size. Largest nodule measures 22 mm in the RIGHT middle lobe with SUV max equal 9.3. Example nodule in the LEFT lower lobe measures 14 mm (image 158, series 3) with SUV max equal 9.6. Hypermetabolic LEFT hilar lymph node noted ABDOMEN/PELVIS Approximately  6 hypermetabolic lesions within the liver. Lesions measure 1-2 cm. Example lesion in the central LEFT hepatic lobe measures 17 mm SUV max equal 15. Lesion inferior RIGHT hepatic lobe with SUV max equals 76.1 Hypermetabolic lesion within the cortex of the RIGHT kidney extends partially exophytic measuring 15 mm SUV max equal 14.8. Two hypermetabolic mesenteric masses. The more superior centrally in the lower abdomen measures 21 mm (image 260, series 3 )with SUV max 19.4. Second lesion just superior to the bladder measures 3.0 cm (image 284, series 3 with intense radiotracer activity (SUV max equal 30). SKELETON Several discrete hypermetabolic skeletal metastasis. Two lesions in the LEFT femur with SUV max equal 10.0. Lytic lesion in the L1 vertebral body with SUV max equal 12.3. Pathologic fracture this level. Small lesion in the midthoracic spine EXTREMITIES Hypermetabolic lesions in the LEFT femur Hypermetabolic muscle lesion in the LEFT adductor muscles. IMPRESSION: 1. Multi organ metastatic melanoma with hypermetabolic lesions within the brain, lungs, liver, RIGHT kidney, and peritoneal metastasis. 2. Hypermetabolic adenopathy involving the a RIGHT level 2 cervical lymph node and LEFT hilar node. 3. Hypermetabolic skeletal lesions involving the spine and LEFT femur. Pathologic fracture at L1. Electronically Signed   By: Suzy Bouchard M.D.   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 Currently on nivo+ipi s/p cycle #1. Tolerated treatment extremely well.  # proceed with Cycle #2 combination Ipi [6m/kg]+Nivo [163mkg] every 3 weeks;Labs today reviewed;  acceptable for treatment today.  We will plan imaging after 3 cycles.  #Brain metastases-  3 lesions-1 approximately 2 cm in size-On SBRT on larger lesion.   On steroids-see discussion below.   # Left foot weakness- sec to brain met- improved; taper over next 10 days- per RT.   # History of thyroid cancer-TSH- normal.   # L1 vertebral body lesion- pathologic compression fracture-; significant pain control.  S/p RT 4/10 today.   #  Follow up in 3 weeks/[place]/ labs.   All questions were answered. The patient knows to call the clinic with any problems, questions or concerns.    GoCammie Sickle  MD 03/26/2017 2:30 PM

## 2017-03-26 NOTE — Progress Notes (Signed)
Patient presents to clinic today for h/o melanoma. Patient has no medical complaints.

## 2017-03-26 NOTE — Assessment & Plan Note (Addendum)
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 Currently on nivo+ipi s/p cycle #1. Tolerated treatment extremely well.  # proceed with Cycle #2 combination Ipi [37m/kg]+Nivo [129mkg] every 3 weeks;Labs today reviewed;  acceptable for treatment today.  We will plan imaging after 3 cycles.  #Brain metastases-  3 lesions-1 approximately 2 cm in size-On SBRT on larger lesion.  On steroids-see discussion below.   # Left foot weakness- sec to brain met- improved; taper over next 10 days- per RT.   # History of thyroid cancer-TSH- normal.   # L1 vertebral body lesion- pathologic compression fracture-; significant pain control.  S/p RT 4/10 today.   #  Follow up in 3 weeks/[place]/ labs.

## 2017-03-27 ENCOUNTER — Ambulatory Visit
Admission: RE | Admit: 2017-03-27 | Discharge: 2017-03-27 | Disposition: A | Discharge status: Home / Self Care | Payer: BC Managed Care – PPO | Source: Ambulatory Visit | Attending: Radiation Oncology | Admitting: Radiation Oncology

## 2017-03-27 DIAGNOSIS — C7931 Secondary malignant neoplasm of brain: Secondary | ICD-10-CM | POA: Diagnosis not present

## 2017-03-28 ENCOUNTER — Ambulatory Visit
Admission: RE | Admit: 2017-03-28 | Discharge: 2017-03-28 | Disposition: A | Discharge status: Home / Self Care | Payer: BC Managed Care – PPO | Source: Ambulatory Visit | Attending: Radiation Oncology | Admitting: Radiation Oncology

## 2017-03-28 DIAGNOSIS — C7931 Secondary malignant neoplasm of brain: Secondary | ICD-10-CM | POA: Diagnosis not present

## 2017-03-31 ENCOUNTER — Ambulatory Visit
Admission: RE | Admit: 2017-03-31 | Discharge: 2017-03-31 | Disposition: A | Discharge status: Home / Self Care | Payer: BC Managed Care – PPO | Source: Ambulatory Visit | Attending: Radiation Oncology | Admitting: Radiation Oncology

## 2017-03-31 DIAGNOSIS — C7931 Secondary malignant neoplasm of brain: Secondary | ICD-10-CM | POA: Diagnosis not present

## 2017-04-01 ENCOUNTER — Ambulatory Visit
Admission: RE | Admit: 2017-04-01 | Discharge: 2017-04-01 | Disposition: A | Discharge status: Home / Self Care | Payer: BC Managed Care – PPO | Source: Ambulatory Visit | Attending: Radiation Oncology | Admitting: Radiation Oncology

## 2017-04-01 DIAGNOSIS — C7931 Secondary malignant neoplasm of brain: Secondary | ICD-10-CM | POA: Diagnosis not present

## 2017-04-02 ENCOUNTER — Ambulatory Visit
Admission: RE | Admit: 2017-04-02 | Discharge: 2017-04-02 | Disposition: A | Discharge status: Home / Self Care | Payer: BC Managed Care – PPO | Source: Ambulatory Visit | Attending: Radiation Oncology | Admitting: Radiation Oncology

## 2017-04-02 DIAGNOSIS — C7931 Secondary malignant neoplasm of brain: Secondary | ICD-10-CM | POA: Diagnosis not present

## 2017-04-03 ENCOUNTER — Ambulatory Visit
Admission: RE | Admit: 2017-04-03 | Discharge: 2017-04-03 | Disposition: A | Discharge status: Home / Self Care | Payer: BC Managed Care – PPO | Source: Ambulatory Visit | Attending: Radiation Oncology | Admitting: Radiation Oncology

## 2017-04-03 DIAGNOSIS — C7931 Secondary malignant neoplasm of brain: Secondary | ICD-10-CM | POA: Diagnosis not present

## 2017-04-04 ENCOUNTER — Encounter: Payer: Self-pay | Admitting: Internal Medicine

## 2017-04-15 ENCOUNTER — Inpatient Hospital Stay: Payer: BC Managed Care – PPO | Attending: Internal Medicine

## 2017-04-15 ENCOUNTER — Ambulatory Visit
Admission: RE | Admit: 2017-04-15 | Discharge: 2017-04-15 | Disposition: A | Discharge status: Home / Self Care | Payer: BC Managed Care – PPO | Source: Ambulatory Visit | Attending: Internal Medicine | Admitting: Internal Medicine

## 2017-04-15 ENCOUNTER — Encounter: Payer: Self-pay | Admitting: Internal Medicine

## 2017-04-15 ENCOUNTER — Telehealth: Payer: Self-pay | Admitting: Internal Medicine

## 2017-04-15 ENCOUNTER — Inpatient Hospital Stay: Payer: BC Managed Care – PPO

## 2017-04-15 ENCOUNTER — Inpatient Hospital Stay (HOSPITAL_BASED_OUTPATIENT_CLINIC_OR_DEPARTMENT_OTHER): Payer: BC Managed Care – PPO | Admitting: Internal Medicine

## 2017-04-15 VITALS — BP 130/82 | HR 132 | Temp 96.6°F | Resp 20

## 2017-04-15 DIAGNOSIS — E876 Hypokalemia: Secondary | ICD-10-CM | POA: Diagnosis not present

## 2017-04-15 DIAGNOSIS — C787 Secondary malignant neoplasm of liver and intrahepatic bile duct: Secondary | ICD-10-CM

## 2017-04-15 DIAGNOSIS — C7951 Secondary malignant neoplasm of bone: Secondary | ICD-10-CM | POA: Insufficient documentation

## 2017-04-15 DIAGNOSIS — R21 Rash and other nonspecific skin eruption: Secondary | ICD-10-CM

## 2017-04-15 DIAGNOSIS — E871 Hypo-osmolality and hyponatremia: Secondary | ICD-10-CM

## 2017-04-15 DIAGNOSIS — R000 Tachycardia, unspecified: Secondary | ICD-10-CM | POA: Diagnosis not present

## 2017-04-15 DIAGNOSIS — C7901 Secondary malignant neoplasm of right kidney and renal pelvis: Secondary | ICD-10-CM | POA: Insufficient documentation

## 2017-04-15 DIAGNOSIS — C434 Malignant melanoma of scalp and neck: Secondary | ICD-10-CM | POA: Diagnosis not present

## 2017-04-15 DIAGNOSIS — C7801 Secondary malignant neoplasm of right lung: Secondary | ICD-10-CM

## 2017-04-15 DIAGNOSIS — E860 Dehydration: Secondary | ICD-10-CM

## 2017-04-15 DIAGNOSIS — R1310 Dysphagia, unspecified: Secondary | ICD-10-CM

## 2017-04-15 DIAGNOSIS — K669 Disorder of peritoneum, unspecified: Secondary | ICD-10-CM | POA: Insufficient documentation

## 2017-04-15 DIAGNOSIS — C7802 Secondary malignant neoplasm of left lung: Secondary | ICD-10-CM

## 2017-04-15 DIAGNOSIS — C7931 Secondary malignant neoplasm of brain: Secondary | ICD-10-CM

## 2017-04-15 DIAGNOSIS — C7800 Secondary malignant neoplasm of unspecified lung: Secondary | ICD-10-CM | POA: Insufficient documentation

## 2017-04-15 DIAGNOSIS — M4856XA Collapsed vertebra, not elsewhere classified, lumbar region, initial encounter for fracture: Secondary | ICD-10-CM | POA: Insufficient documentation

## 2017-04-15 DIAGNOSIS — R590 Localized enlarged lymph nodes: Secondary | ICD-10-CM | POA: Insufficient documentation

## 2017-04-15 LAB — CBC WITH DIFFERENTIAL/PLATELET
BASOS PCT: 0 %
Basophils Absolute: 0 10*3/uL (ref 0–0.1)
EOS PCT: 1 %
Eosinophils Absolute: 0.1 10*3/uL (ref 0–0.7)
HEMATOCRIT: 33 % — AB (ref 35.0–47.0)
Hemoglobin: 11.4 g/dL — ABNORMAL LOW (ref 12.0–16.0)
Lymphocytes Relative: 9 %
Lymphs Abs: 1.1 10*3/uL (ref 1.0–3.6)
MCH: 29.3 pg (ref 26.0–34.0)
MCHC: 34.7 g/dL (ref 32.0–36.0)
MCV: 84.7 fL (ref 80.0–100.0)
MONO ABS: 1 10*3/uL — AB (ref 0.2–0.9)
MONOS PCT: 9 %
Neutro Abs: 9.1 10*3/uL — ABNORMAL HIGH (ref 1.4–6.5)
Neutrophils Relative %: 81 %
Platelets: 393 10*3/uL (ref 150–440)
RBC: 3.9 MIL/uL (ref 3.80–5.20)
RDW: 13.7 % (ref 11.5–14.5)
WBC: 11.2 10*3/uL — ABNORMAL HIGH (ref 3.6–11.0)

## 2017-04-15 LAB — COMPREHENSIVE METABOLIC PANEL
ALBUMIN: 3.1 g/dL — AB (ref 3.5–5.0)
ALK PHOS: 132 U/L — AB (ref 38–126)
ALT: 30 U/L (ref 14–54)
AST: 17 U/L (ref 15–41)
Anion gap: 9 (ref 5–15)
BUN: 9 mg/dL (ref 6–20)
CALCIUM: 7.3 mg/dL — AB (ref 8.9–10.3)
CO2: 23 mmol/L (ref 22–32)
CREATININE: 0.47 mg/dL (ref 0.44–1.00)
Chloride: 95 mmol/L — ABNORMAL LOW (ref 101–111)
GFR calc Af Amer: >60 mL/min (ref >60)
GFR calc non Af Amer: >60 mL/min (ref >60)
GLUCOSE: 152 mg/dL — AB (ref 65–99)
Potassium: 3.1 mmol/L — ABNORMAL LOW (ref 3.5–5.1)
SODIUM: 127 mmol/L — AB (ref 135–145)
Total Bilirubin: 0.6 mg/dL (ref 0.3–1.2)
Total Protein: 7 g/dL (ref 6.5–8.1)

## 2017-04-15 MED ORDER — IOPAMIDOL (ISOVUE-300) INJECTION 61%
100.0000 mL | Freq: Once | INTRAVENOUS | Status: AC | PRN
  Administered 2017-04-15: 100 mL via INTRAVENOUS

## 2017-04-15 MED ORDER — SODIUM CHLORIDE 0.9% FLUSH
10.0000 mL | INTRAVENOUS | Status: DC | PRN
  Administered 2017-04-15: 10 mL
  Filled 2017-04-15: qty 10

## 2017-04-15 MED ORDER — POTASSIUM CHLORIDE 20 MEQ/100ML IV SOLN
INTRAVENOUS | Status: AC
  Filled 2017-04-15: qty 100

## 2017-04-15 MED ORDER — POTASSIUM CHLORIDE 20 MEQ/100ML IV SOLN
20.0000 meq | Freq: Once | INTRAVENOUS | Status: AC
  Administered 2017-04-15: 20 meq via INTRAVENOUS

## 2017-04-15 MED ORDER — SODIUM CHLORIDE 0.9 % IV SOLN
Freq: Once | INTRAVENOUS | Status: AC
Start: 2017-04-15 — End: 2017-04-15
  Administered 2017-04-15: 10:00:00 via INTRAVENOUS
  Filled 2017-04-15: qty 1000

## 2017-04-15 MED ORDER — HEPARIN SOD (PORK) LOCK FLUSH 100 UNIT/ML IV SOLN
500.0000 [IU] | Freq: Once | INTRAVENOUS | Status: AC | PRN
  Administered 2017-04-15: 500 [IU]
  Filled 2017-04-15: qty 5

## 2017-04-15 NOTE — Assessment & Plan Note (Addendum)
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  #  Currently on nivo+ipi s/p cycle #2. Tolerated treatment well except difficulty swallowing/tachycardia fatigue  #Hold cycle #3 of combination immunotherapy today because of above issues/see discussion below.   # Vomiting/difficult swallowing to solids; no nausea headaches.  Question etiology. Get a CT of the neck chest abdomen pelvis-evaluate the status of melanoma.   # Hypoklamia-supp 20 meq x1.  Also home nutrition supplementation   # hyponatremia-sodium 127/tachycardia suspect dehydration-recommend IV fluids bolus.  # skin rash/hyperpigmentation at the site of radiation.  Recommend hydrocortisone cream.  #Brain metastases-  3 lesions-1 approximately 2 cm in size-On SBRT on larger lesion.  Will recommend MRI in the next few weeks.  # Left foot weakness-  Resolved   # History of thyroid cancer-TSH- normal.   # L1 vertebral body lesion- pathologic compression fracture- resolved.  #CT scan chest and pelvis neck contrast stat; follow-up in 1 week labs; immunotherapy infusion.  # 40 minutes face-to-face with the patient discussing the above plan of care; more than 50% of time spent on prognosis/ natural history; counseling and coordination.

## 2017-04-15 NOTE — Progress Notes (Signed)
[place] OFFICE PROGRESS NOTE  Patient Care Team: Dion Body, MD as PCP - General (Family Medicine)  Cancer Staging No matching staging information was found for the patient.   Oncology History   # NOV 2018-RECURRENT METASTATIC MELANOMA MULTIPLE LIVER LESIONS/lung lesions [s/p Bx on Nov 30th]; DEc 2018- PET-multiple lung liver bone metastasis.   # Dec 11th,2018- ipi+Nivo  # Dec 1st 2018- Brain mets- 25m [s/p SBRT- dec 21st]; and 2 other 2-4 mm.   # Bone mets- X-geva [12/04]; plan RT   # Hx of Melanoma- dec 2011 [SLNBx- 0/4-Neg;[facility] ]; Hx of thyroid cancer- feb 2001- s/p left throid lobectomy [[place]]; NO RAIU.  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      INTERVAL HISTORY:  Tanya [id number].[id number].  female pleasant patient above history of  metastatic melanoma-brain and liver-currently status post ipi+nivo cycle #2-approximately 3 weeks ago is here for follow-up.  Patient complains of difficulty in keeping the solids down for the last 1-2 days.  She states that she has been able to swallow liquids fine.  With solids-she regurgitates after small meal; and then she is able to swallow better.  She denies any headaches.  Denies any nausea.  Denies any vision changes.  She complains of fatigue but no diarrhea.  No abdominal pain.  She currently off steroids approximately 2 weeks ago.  She complains of a skin rash in the area of the radiation the back.  Mild itching.  REVIEW OF SYSTEMS:  A complete 10 point review of system is done which is negative except mentioned above/history of present illness.   PAST MEDICAL HISTORY :  Past Medical History:  Diagnosis Date  . Hypothyroidism   . [place]   Resected from Right neck area with 4 lymph nodes as well.   . [place] [place] [place]   Partial thyroidectomy    PAST SURGICAL HISTORY :   Past Surgical History:   Procedure Laterality Date  . BREAST BIOPSY Right 2005   neg  . BREAST BIOPSY Left 07/11/2016   radial scar  . BREAST EXCISIONAL BIOPSY Left 08/06/2016   lumpectomy for radial scar  . BREAST LUMPECTOMY WITH NEEDLE LOCALIZATION Left 08/06/2016   Procedure: BREAST LUMPECTOMY WITH NEEDLE LOCALIZATION;  Surgeon: SLeonie Green MD;  Location: [facility] OR[facility];  Service: General;  Laterality: Left;  .Marland KitchenMELANOMA EXCISION  2011   ear  . PORTA CATH INSERTION N/A 03/14/2017   Procedure: PORTA CATH INSERTION;  Surgeon: SKatha Cabal MD;  [facility];  Service: Cardiovascular;  Laterality: N/A;  . THYROIDECTOMY, PARTIAL Left     FAMILY HISTORY :   Family History  Problem Relation Age of Onset  . Breast cancer Mother 529 . Breast cancer Paternal Aunt   . Colon cancer Brother 545   SOCIAL HISTORY:   Social History   Tobacco Use  . Smoking status: Former Smoker    Packs/day: 0.25    Years: 20.00    Pack years: 5.00    Types: Cigarettes    Last attempt to quit: 03/26/1999    Years since quitting: 18.0  . Smokeless tobacco: Never Used  Substance Use Topics  . Alcohol use: Yes    Comment: WEEKENDS  . Drug use: No    ALLERGIES:  is allergic to tetanus-diphtheria toxoids td.  MEDICATIONS:  Current Outpatient Medications  Medication  Sig Dispense Refill  . atorvastatin (LIPITOR) 40 MG tablet Take 40 mg by mouth every evening.    Marland Kitchen levothyroxine (SYNTHROID, LEVOTHROID) 100 MCG tablet Take 100 mcg by mouth daily before breakfast.    . lidocaine-prilocaine (EMLA) cream Apply 1 application topically as needed. 30 g 4  . dexamethasone (DECADRON) 4 MG tablet Take 0.5 tablets by mouth 2 (two) times daily.   0  . traMADol (ULTRAM) 50 MG tablet Take 1 tablet (50 mg total) by mouth every 6 (six) hours as needed. (Patient not taking: Reported on 03/26/2017) 60 tablet 0   No current facility-administered medications for this visit.    Facility-Administered Medications Ordered  in Other Visits  Medication Dose Route Frequency Provider Last Rate Last Dose  . sodium chloride flush (NS) 0.9 % injection 10 mL  10 mL Intracatheter PRN Charlaine Dalton R, MD   10 mL at 04/15/17 0900    PHYSICAL EXAMINATION: ECOG PERFORMANCE STATUS: 0 - Asymptomatic  BP 130/82 (BP Location: Right Arm, Patient Position: Sitting)   Pulse (!) 132   Temp (!) 96.6 F (35.9 C) (Tympanic)   Resp 20   LMP 02/29/2012 (Approximate)   There were no vitals filed for this visit.  GENERAL: Well-nourished well-developed; Alert, no distress and comfortable.   Alone. EYES: no pallor or icterus OROPHARYNX: no thrush or ulceration; good dentition  NECK: supple, no neck mass on the right side improved. LYMPH:  no palpable lymphadenopathy in the cervical, axillary or inguinal regions LUNGS: clear to auscultation and  No wheeze or crackles HEART/CVS: regular rate & rhythm and no murmurs; No lower extremity edema ABDOMEN:abdomen soft, non-tender and normal bowel sounds Musculoskeletal:no cyanosis of digits and no clubbing  PSYCH: alert & oriented x 3 with fluent speech NEURO: no focal motor/sensory deficits SKIN: 2 x 2 subcutaneous nodule noted in the right breast-lower inner quadrant; 3 x 3 cm subcutaneous lesion noted-right inframammary [chronic]  LABORATORY DATA:  I have reviewed the data as listed    Component Value Date/Time   NA 127 (L) 04/15/2017 0829   K 3.1 (L) 04/15/2017 0829   CL 95 (L) 04/15/2017 0829   CO2 23 04/15/2017 0829   GLUCOSE 152 (H) 04/15/2017 0829   BUN 9 04/15/2017 0829   CREATININE 0.47 04/15/2017 0829   CALCIUM 7.3 (L) 04/15/2017 0829   PROT 7.0 04/15/2017 0829   ALBUMIN 3.1 (L) 04/15/2017 0829   AST 17 04/15/2017 0829   ALT 30 04/15/2017 0829   ALKPHOS 132 (H) 04/15/2017 0829   BILITOT 0.6 04/15/2017 0829   GFRNONAA >60 04/15/2017 0829   GFRAA >60 04/15/2017 0829    No results found for: SPEP, UPEP  Lab Results  Component Value Date   WBC 11.2 (H)  04/15/2017   NEUTROABS 9.1 (H) 04/15/2017   HGB 11.4 (L) 04/15/2017   HCT 33.0 (L) 04/15/2017   MCV 84.7 04/15/2017   PLT 393 04/15/2017      Chemistry      Component Value Date/Time   NA 127 (L) 04/15/2017 0829   K 3.1 (L) 04/15/2017 0829   CL 95 (L) 04/15/2017 0829   CO2 23 04/15/2017 0829   BUN 9 04/15/2017 0829   CREATININE 0.47 04/15/2017 0829      Component Value Date/Time   CALCIUM 7.3 (L) 04/15/2017 0829   ALKPHOS 132 (H) 04/15/2017 0829   AST 17 04/15/2017 0829   ALT 30 04/15/2017 0829   BILITOT 0.6 04/15/2017 7341  RADIOGRAPHIC STUDIES: I have personally reviewed the radiological images as listed and agreed with the findings in the report. No results found.   ASSESSMENT & PLAN:  Metastatic melanoma to liver ([place]) Recurrent metastatic melanoma - multiple metastatic lesions to the liver; bilateral lung nodules; L1 vertebral compression fracture.  #  Currently on nivo+ipi s/p cycle #2. Tolerated treatment well except difficulty swallowing/tachycardia fatigue  #Hold cycle #3 of combination immunotherapy today because of above issues/see discussion below.   # Vomiting/difficult swallowing to solids; no nausea headaches.  Question etiology. Get a CT of the neck chest abdomen pelvis-evaluate the status of melanoma.   # Hypoklamia-supp 20 meq x1.  Also home nutrition supplementation   # hyponatremia-sodium 127/tachycardia suspect dehydration-recommend IV fluids bolus.  # skin rash/hyperpigmentation at the site of radiation.  Recommend hydrocortisone cream.  #Brain metastases-  3 lesions-1 approximately 2 cm in size-On SBRT on larger lesion.  Will recommend MRI in the next few weeks.  # Left foot weakness-  Resolved   # History of thyroid cancer-TSH- normal.   # L1 vertebral body lesion- pathologic compression fracture- resolved.  #CT scan chest and pelvis neck contrast stat; follow-up in 1 week labs; immunotherapy infusion.  # 40 minutes  face-to-face with the patient discussing the above plan of care; more than 50% of time spent on prognosis/ natural history; counseling and coordination.    Orders Placed This Encounter  Procedures  . CT Chest W Contrast    Standing Status:   Future    Standing Expiration Date:   04/15/2018    Order Specific Question:   If indicated for the ordered procedure, I authorize the administration of contrast media per Radiology protocol    Answer:   Yes    Order Specific Question:   Is patient pregnant?    Answer:   No    Order Specific Question:   Preferred imaging location?    Answer:   [facility]    Order Specific Question:   Radiology Contrast Protocol - do NOT remove file path    Answer:   \\[email or web address].[email or web address]  . CT ABDOMEN PELVIS W CONTRAST    Standing Status:   Future    Standing Expiration Date:   04/15/2018    Order Specific Question:   If indicated for the ordered procedure, I authorize the administration of contrast media per Radiology protocol    Answer:   Yes    Order Specific Question:   Preferred imaging location?    Answer:   [facility]    Order Specific Question:   Call Results- Best Contact Number?    Answer:   [phone] or [phone] do not hold patient    Order Specific Question:   Radiology Contrast Protocol - do NOT remove file path    Answer:   \\[email or web address].[email or web address]    Order Specific Question:   Reason for Exam additional comments    Answer:   melanoma    Order Specific Question:   Is patient pregnant?    Answer:   No  . CT SOFT TISSUE NECK W CONTRAST    Standing Status:   Future    Standing Expiration Date:   07/15/2018    Order Specific Question:   If indicated for the ordered procedure, I authorize the administration of contrast media per Radiology protocol    Answer:   Yes    Order Specific Question:   Is patient pregnant?    Answer:   No    Order Specific  Question:   Preferred imaging location?     Answer:   [facility]    Order Specific Question:   Radiology Contrast Protocol - do NOT remove file path    Answer:   \\[email or web address]    Order Specific Question:   Reason for Exam additional comments    Answer:   melanoma  . CBC with Differential/Platelet    Standing Status:   Future    Standing Expiration Date:   04/15/2018  . Comprehensive metabolic panel    Standing Status:   Future    Standing Expiration Date:   04/15/2018  . Lactate dehydrogenase    Standing Status:   Future    Standing Expiration Date:   04/15/2018   All questions were answered. The patient knows to call the clinic with any problems, questions or concerns.      Cammie Sickle, MD 04/15/2017 11:08 AM

## 2017-04-15 NOTE — Telephone Encounter (Signed)
Spoke with patient regarding the results of the CT scan imaging progression noted.  Recommend stat MRI of the brain patient agreeable.   Collette-please schedule. Thx

## 2017-04-15 NOTE — Patient Instructions (Signed)
Dehydration, Adult Dehydration is when there is not enough fluid or water in your body. This happens when you lose more fluids than you take in. Dehydration can range from mild to very bad. It should be treated right away to keep it from getting very bad. Symptoms of mild dehydration may include:  Thirst.  Dry lips.  Slightly dry mouth.  Dry, warm skin.  Dizziness. Symptoms of moderate dehydration may include:  Very dry mouth.  Muscle cramps.  Dark pee (urine). Pee may be the color of tea.  Your body making less pee.  Your eyes making fewer tears.  Heartbeat that is uneven or faster than normal (palpitations).  Headache.  Light-headedness, especially when you stand up from sitting.  Fainting (syncope). Symptoms of very bad dehydration may include:  Changes in skin, such as: ? Cold and clammy skin. ? Blotchy (mottled) or pale skin. ? Skin that does not quickly return to normal after being lightly pinched and let go (poor skin turgor).  Changes in body fluids, such as: ? Feeling very thirsty. ? Your eyes making fewer tears. ? Not sweating when body temperature is high, such as in hot weather. ? Your body making very little pee.  Changes in vital signs, such as: ? Weak pulse. ? Pulse that is more than 100 beats a minute when you are sitting still. ? Fast breathing. ? Low blood pressure.  Other changes, such as: ? Sunken eyes. ? Cold hands and feet. ? Confusion. ? Lack of energy (lethargy). ? Trouble waking up from sleep. ? Short-term weight loss. ? Unconsciousness. Follow these instructions at home:  If told by your doctor, drink an ORS: ? Make an ORS by using instructions on the package. ? Start by drinking small amounts, about  cup (120 mL) every 5-10 minutes. ? Slowly drink more until you have had the amount that your doctor said to have.  Drink enough clear fluid to keep your pee clear or pale yellow. If you were told to drink an ORS, finish the ORS  first, then start slowly drinking clear fluids. Drink fluids such as: ? Water. Do not drink only water by itself. Doing that can make the salt (sodium) level in your body get too low (hyponatremia). ? Ice chips. ? Fruit juice that you have added water to (diluted). ? Low-calorie sports drinks.  Avoid: ? Alcohol. ? Drinks that have a lot of sugar. These include high-calorie sports drinks, fruit juice that does not have water added, and soda. ? Caffeine. ? Foods that are greasy or have a lot of fat or sugar.  Take over-the-counter and prescription medicines only as told by your doctor.  Do not take salt tablets. Doing that can make the salt level in your body get too high (hypernatremia).  Eat foods that have minerals (electrolytes). Examples include bananas, oranges, potatoes, tomatoes, and spinach.  Keep all follow-up visits as told by your doctor. This is important. Contact a doctor if:  You have belly (abdominal) pain that: ? Gets worse. ? Stays in one area (localizes).  You have a rash.  You have a stiff neck.  You get angry or annoyed more easily than normal (irritability).  You are more sleepy than normal.  You have a harder time waking up than normal.  You feel: ? Weak. ? Dizzy. ? Very thirsty.  You have peed (urinated) only a small amount of very dark pee during 6-8 hours. Get help right away if:  You have symptoms of   very bad dehydration.  You cannot drink fluids without throwing up (vomiting).  Your symptoms get worse with treatment.  You have a fever.  You have a very bad headache.  You are throwing up or having watery poop (diarrhea) and it: ? Gets worse. ? Does not go away.  You have blood or something green (bile) in your throw-up.  You have blood in your poop (stool). This may cause poop to look black and tarry.  You have not peed in 6-8 hours.  You pass out (faint).  Your heart rate when you are sitting still is more than 100 beats a  minute.  You have trouble breathing. This information is not intended to replace advice given to you by your health care provider. Make sure you discuss any questions you have with your health care provider. Document Released: 01/05/2009 Document Revised: 09/29/2015 Document Reviewed: 05/05/2015 Elsevier Interactive Patient Education  2018 Elsevier Inc.  

## 2017-04-15 NOTE — Telephone Encounter (Signed)
Given B-raf mutation; plan B-raf In+ MEK- inhibitor; discussed with Alysson. Plan- dab+trem OR enco+bini- based on CNS penetration.

## 2017-04-16 ENCOUNTER — Other Ambulatory Visit: Payer: Self-pay | Admitting: *Deleted

## 2017-04-16 ENCOUNTER — Telehealth: Payer: Self-pay | Admitting: Internal Medicine

## 2017-04-16 ENCOUNTER — Ambulatory Visit
Admission: RE | Admit: 2017-04-16 | Discharge: 2017-04-16 | Disposition: A | Discharge status: Home / Self Care | Payer: BC Managed Care – PPO | Source: Ambulatory Visit | Attending: Internal Medicine | Admitting: Internal Medicine

## 2017-04-16 ENCOUNTER — Telehealth: Payer: Self-pay | Admitting: Pharmacist

## 2017-04-16 DIAGNOSIS — C7931 Secondary malignant neoplasm of brain: Secondary | ICD-10-CM | POA: Insufficient documentation

## 2017-04-16 DIAGNOSIS — Z5181 Encounter for therapeutic drug level monitoring: Secondary | ICD-10-CM

## 2017-04-16 DIAGNOSIS — C439 Malignant melanoma of skin, unspecified: Secondary | ICD-10-CM | POA: Insufficient documentation

## 2017-04-16 DIAGNOSIS — C787 Secondary malignant neoplasm of liver and intrahepatic bile duct: Secondary | ICD-10-CM

## 2017-04-16 DIAGNOSIS — Z79899 Other long term (current) drug therapy: Principal | ICD-10-CM

## 2017-04-16 MED ORDER — DABRAFENIB MESYLATE 75 MG PO CAPS: 150.0000 mg | ORAL_CAPSULE | Freq: Two times a day (BID) | ORAL | 4 refills | Status: DC

## 2017-04-16 MED ORDER — TRAMETINIB DIMETHYL SULFOXIDE 2 MG PO TABS: 2.0000 mg | ORAL_TABLET | Freq: Every day | ORAL | 4 refills | Status: DC

## 2017-04-16 MED ORDER — GADOBENATE DIMEGLUMINE 529 MG/ML IV SOLN
15.0000 mL | Freq: Once | INTRAVENOUS | Status: AC | PRN
  Administered 2017-04-16: 13 mL via INTRAVENOUS

## 2017-04-16 MED FILL — MEKINIST 2 MG TAB: 2 | 30 days supply | Qty: 30 | Fill #0

## 2017-04-16 MED FILL — TAFINLAR 75 MG CAPSULE: 75 | 30 days supply | Qty: 120 | Fill #0

## 2017-04-16 NOTE — Telephone Encounter (Signed)
Oral Oncology Pharmacist Encounter  Received new prescription for Mekinist/Tafinlar (dabrafenib/trametinib) for the treatment of Metastatic Melanoma, planned duration until disease progression or unacceptable drug toxicity.  BP from 04/15/17 assessed, BP within normal limits. Patient scheduled for baseline ECHO. Prescription dose and frequency assessed.   Current medication list in Epic reviewed, a few DDIs with dabrafenib (Mekinist) identified: - Dabrafenib may decrease the concentrations of atorvastatin and tramadol. No dose adjustments recommended. Monitor patient for decreased effectiveness of atorvastatin and tramadol.   Prescription has been e-scribed to the [facility] for benefits analysis and approval.  Oral Oncology Clinic will continue to follow for insurance authorization, copayment issues, initial counseling and start date.  Darl Pikes, PharmD, BCPS Hematology/Oncology Clinical Pharmacist [facility] [phone]  04/16/2017 9:25 AM

## 2017-04-16 NOTE — Telephone Encounter (Signed)
Oral Oncology Patient Advocate Encounter  Was able to get co-pay card for patient thru [place]: [id number] PCN: [id number] GRP: [id number] ID: [id number]  For: Mekinist BIN: [id number] PCN: [facility]CP GRP: [id number] ID: [id number]  Must call each month to get a CC from them to pay the difference on her Mekinist. [phone]  She will owe $10.00 for her [place] Patient Advocate [phone] 04/16/2017 2:45 PM

## 2017-04-16 NOTE — Telephone Encounter (Signed)
FYI-  Spoke to pt re: MRI brain; Asymptomatic.  Hold off steroids.  Okay to refill prescription of dexamethasone if she needs in future.  Discussed with Dr. Donella Stade; Hold RT; discussed with pharmacy-working on to get Dab+Trem.

## 2017-04-16 NOTE — Telephone Encounter (Signed)
Will start pt on dabrafenib + tremitinib- given brain mets.   Allyson- please talk to pt re: potential side effects; and I will reinforce with pt. Thx   Will also order 2 d echo asap. Please schedule.

## 2017-04-16 NOTE — Telephone Encounter (Signed)
Oral Oncology Patient Advocate Encounter  Prior Authorization for Tafinlar & Mekinist  has been approved.    PA# Over Phone Effective dates: 04/16/2017 through 03/24/2018  Oral Oncology Clinic will continue to follow.  [place] Patient Advocate [phone] 04/16/2017 2:44 PM    Mekinist co-pay $2,757.36 Tafinlar co-pay $250.00

## 2017-04-16 NOTE — Telephone Encounter (Signed)
Please have pt follow up with me tomorrow at 1:30/ no labs.   Discussed with Alysson; approved; will likley get pills tomorrow.

## 2017-04-16 NOTE — Telephone Encounter (Signed)
Oral Oncology Patient Advocate Encounter  Received notification from [place] that prior authorization for [place] is required.  PA submitted on CoverMyMeds Key [id number] Status is pending  Oral Oncology Clinic will continue to follow.   [place] Patient Advocate [phone] 04/16/2017 10:44 AM

## 2017-04-17 ENCOUNTER — Inpatient Hospital Stay (HOSPITAL_BASED_OUTPATIENT_CLINIC_OR_DEPARTMENT_OTHER): Payer: BC Managed Care – PPO | Admitting: Internal Medicine

## 2017-04-17 ENCOUNTER — Encounter: Payer: Self-pay | Admitting: Internal Medicine

## 2017-04-17 ENCOUNTER — Ambulatory Visit: Payer: BC Managed Care – PPO

## 2017-04-17 VITALS — BP 116/82 | HR 121 | Temp 98.5°F | Wt 136.0 lb

## 2017-04-17 DIAGNOSIS — C7931 Secondary malignant neoplasm of brain: Secondary | ICD-10-CM | POA: Diagnosis not present

## 2017-04-17 DIAGNOSIS — C786 Secondary malignant neoplasm of retroperitoneum and peritoneum: Secondary | ICD-10-CM

## 2017-04-17 DIAGNOSIS — R1310 Dysphagia, unspecified: Secondary | ICD-10-CM | POA: Diagnosis not present

## 2017-04-17 DIAGNOSIS — R000 Tachycardia, unspecified: Secondary | ICD-10-CM

## 2017-04-17 DIAGNOSIS — Z923 Personal history of irradiation: Secondary | ICD-10-CM

## 2017-04-17 DIAGNOSIS — C7800 Secondary malignant neoplasm of unspecified lung: Secondary | ICD-10-CM

## 2017-04-17 DIAGNOSIS — Z85850 Personal history of malignant neoplasm of thyroid: Secondary | ICD-10-CM

## 2017-04-17 DIAGNOSIS — E876 Hypokalemia: Secondary | ICD-10-CM | POA: Diagnosis not present

## 2017-04-17 DIAGNOSIS — C434 Malignant melanoma of scalp and neck: Secondary | ICD-10-CM

## 2017-04-17 DIAGNOSIS — Z87891 Personal history of nicotine dependence: Secondary | ICD-10-CM

## 2017-04-17 DIAGNOSIS — C787 Secondary malignant neoplasm of liver and intrahepatic bile duct: Secondary | ICD-10-CM | POA: Diagnosis not present

## 2017-04-17 DIAGNOSIS — C7951 Secondary malignant neoplasm of bone: Secondary | ICD-10-CM | POA: Diagnosis not present

## 2017-04-17 DIAGNOSIS — C7901 Secondary malignant neoplasm of right kidney and renal pelvis: Secondary | ICD-10-CM

## 2017-04-17 DIAGNOSIS — Z79899 Other long term (current) drug therapy: Secondary | ICD-10-CM

## 2017-04-17 NOTE — Progress Notes (Signed)
[place] OFFICE PROGRESS NOTE  Patient Care Team: Dion Body, MD as PCP - General (Family Medicine)  Cancer Staging No matching staging information was found for the patient.   Oncology History   # NOV 2018-RECURRENT METASTATIC MELANOMA MULTIPLE LIVER LESIONS/lung lesions [s/p Bx on Nov 30th]; DEc 2018- PET-multiple lung liver bone metastasis.   # Dec 11th,2018- ipi+Nivo s/p 2 cycles;  # Jan 23rd 2019 CT scan- Progression; Jan 2019- MRI brain- improved SBRT lesion; worsening 9 mm left parietal; new 2 mm right parietal  #April 17, 2017- Dabrafenib+Mekinist  # Dec 1st 2018- Brain mets- 77m [s/p SBRT- dec 21st]; and 2 other 2-4 mm.   # Bone mets- X-geva [12/04]; plan RT   # Hx of Melanoma- dec 2011 [SLNBx- 0/4-Neg;[facility] ]; Hx of thyroid cancer- feb 2001- s/p left throid lobectomy [[place]]; NO RAIU.  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  female pleasant patient above history of  metastatic melanoma-brain and liver-currently status post ipi+nivo cycle #2-approximately 3 weeks ago; is here to review the results of her restaging CAT scan/MRI brain.  She complains of fatigue but no diarrhea.  No abdominal pain.  She denies any headaches.  Denies any nausea vomiting.  Difficulty swallowing improving.  Denies any significant pain at this time.   REVIEW OF SYSTEMS:  A complete 10 point review of system is done which is negative except mentioned above/history of present illness.   PAST MEDICAL HISTORY :  Past Medical History:  Diagnosis Date  . Hypothyroidism   . [place] ([place]   Resected from Right neck area with 4 lymph nodes as well.   . [place]   Partial thyroidectomy    PAST SURGICAL HISTORY :   Past Surgical History:  Procedure Laterality Date  . BREAST BIOPSY Right 2005   neg  . BREAST  BIOPSY Left 07/11/2016   radial scar  . BREAST EXCISIONAL BIOPSY Left 08/06/2016   lumpectomy for radial scar  . BREAST LUMPECTOMY WITH NEEDLE LOCALIZATION Left 08/06/2016   Procedure: BREAST LUMPECTOMY WITH NEEDLE LOCALIZATION;  Surgeon: SLeonie Green MD;  Location: [facility] ORS;  Service: General;  Laterality: Left;  .Marland KitchenMELANOMA EXCISION  2011   ear  . PORTA CATH INSERTION N/A 03/14/2017   Procedure: PORTA CATH INSERTION;  Surgeon: SKatha Cabal MD;  [facility];  Service: Cardiovascular;  Laterality: N/A;  . THYROIDECTOMY, PARTIAL Left     FAMILY HISTORY :   Family History  Problem Relation Age of Onset  . Breast cancer Mother 539 . Breast cancer Paternal Aunt   . Colon cancer Brother 543   SOCIAL HISTORY:   Social History   Tobacco Use  . Smoking status: Former Smoker    Packs/day: 0.25    Years: 20.00    Pack years: 5.00    Types: Cigarettes    Last attempt to quit: 03/26/1999    Years since quitting: 18.0  . Smokeless tobacco: Never Used  Substance Use Topics  . Alcohol use: Yes    Comment: WEEKENDS  . Drug use: No    ALLERGIES:  is allergic to tetanus-diphtheria toxoids td.  MEDICATIONS:  Current Outpatient Medications  Medication Sig Dispense Refill  . atorvastatin (LIPITOR) 40 MG tablet Take 40 mg by mouth every evening.    .Marland Kitchen  dabrafenib mesylate (TAFINLAR) 75 MG capsule Take 2 capsules (150 mg total) by mouth 2 (two) times daily. Take on an empty stomach 1 hour before or 2 hours after meals. 120 capsule 4  . dexamethasone (DECADRON) 4 MG tablet Take 0.5 tablets by mouth 2 (two) times daily.   0  . levothyroxine (SYNTHROID, LEVOTHROID) 100 MCG tablet Take 100 mcg by mouth daily before breakfast.    . lidocaine-prilocaine (EMLA) cream Apply 1 application topically as needed. 30 g 4  . traMADol (ULTRAM) 50 MG tablet Take 1 tablet (50 mg total) by mouth every 6 (six) hours as needed. 60 tablet 0  . trametinib dimethyl sulfoxide  (MEKINIST) 2 MG tablet Take 1 tablet (2 mg total) by mouth daily. Take 1 hour before or 2 hours after a meal. Store refrigerated in original container. 30 tablet 4   No current facility-administered medications for this visit.     PHYSICAL EXAMINATION: ECOG PERFORMANCE STATUS: 0 - Asymptomatic  BP 116/82 (BP Location: Left Arm)   Pulse (!) 121   Temp 98.5 F (36.9 C) (Oral)   Wt 136 lb (61.7 kg)   LMP 02/29/2012 (Approximate)   BMI 23.34 kg/m   Filed Weights   04/17/17 1340  Weight: 136 lb (61.7 kg)    GENERAL: Well-nourished well-developed; Alert, no distress and comfortable.   Alone. EYES: no pallor or icterus OROPHARYNX: no thrush or ulceration; good dentition  NECK: supple, no neck mass on the right side improved. LYMPH:  no palpable lymphadenopathy in the cervical, axillary or inguinal regions LUNGS: clear to auscultation and  No wheeze or crackles HEART/CVS: regular rate & rhythm and no murmurs; No lower extremity edema ABDOMEN:abdomen soft, non-tender and normal bowel sounds Musculoskeletal:no cyanosis of digits and no clubbing  PSYCH: alert & oriented x 3 with fluent speech NEURO: no focal motor/sensory deficits SKIN: 2 x 2 subcutaneous nodule noted in the right breast-lower inner quadrant; 3 x 3 cm subcutaneous lesion noted-right inframammary [chronic]  LABORATORY DATA:  I have reviewed the data as listed    Component Value Date/Time   NA 127 (L) 04/15/2017 0829   K 3.1 (L) 04/15/2017 0829   CL 95 (L) 04/15/2017 0829   CO2 23 04/15/2017 0829   GLUCOSE 152 (H) 04/15/2017 0829   BUN 9 04/15/2017 0829   CREATININE 0.47 04/15/2017 0829   CALCIUM 7.3 (L) 04/15/2017 0829   PROT 7.0 04/15/2017 0829   ALBUMIN 3.1 (L) 04/15/2017 0829   AST 17 04/15/2017 0829   ALT 30 04/15/2017 0829   ALKPHOS 132 (H) 04/15/2017 0829   BILITOT 0.6 04/15/2017 0829   GFRNONAA >60 04/15/2017 0829   GFRAA >60 04/15/2017 0829    No results found for: SPEP, UPEP  Lab Results   Component Value Date   WBC 11.2 (H) 04/15/2017   NEUTROABS 9.1 (H) 04/15/2017   HGB 11.4 (L) 04/15/2017   HCT 33.0 (L) 04/15/2017   MCV 84.7 04/15/2017   PLT 393 04/15/2017      Chemistry      Component Value Date/Time   NA 127 (L) 04/15/2017 0829   K 3.1 (L) 04/15/2017 0829   CL 95 (L) 04/15/2017 0829   CO2 23 04/15/2017 0829   BUN 9 04/15/2017 0829   CREATININE 0.47 04/15/2017 0829      Component Value Date/Time   CALCIUM 7.3 (L) 04/15/2017 0829   ALKPHOS 132 (H) 04/15/2017 0829   AST 17 04/15/2017 0829   ALT 30 04/15/2017 0829  BILITOT 0.6 04/15/2017 0829       RADIOGRAPHIC STUDIES: I have personally reviewed the radiological images as listed and agreed with the findings in the report. No results found.   ASSESSMENT & PLAN:  Metastatic melanoma to liver ([place]) Recurrent metastatic melanoma - multiple metastatic lesions to the liver; bilateral lung nodules; L1 vertebral compression fracture.  # Currently on nivo+ipi s/p cycle #2-JAN 23rd CT scan-shows progression of disease in the lungs; cutaneous nodules ; also brain.  Given b-raf positive disease; and significant bulk of disease-recommend starting on B-raf inhibitor + MEK inhibitor.  #Start patient on dabrafenib plus Mekinist.  Discussed the potential side effects including but not limited to skin rash; fevers; also visual problems and also cardiomyopathy.  We will get a 2D echo.  # Vomiting/difficult swallowing to solids; no nausea headaches.  Etiology monitor for now if worsening recommend barium esophagogram.  No obvious reason noted on the CT scan.  # Brain metastases-status post SBR T-lesion smaller; however previously 2 mm lesion left parietal lobe increased to 9 mm edema; other 2 mm nodule noted on the right parietal area.  Currently asymptomatic.  Patient is starting on B-raf inhibitor+ mekinist-should have intracranial response also.  Also discussed with Dr. Donella Stade.  #Follow-up in approximately 10  days/[place] clinic labs.  # I reviewed the blood work- with the patient in detail; also reviewed the imaging independently [as summarized above]; and with the patient in detail.       Orders Placed This Encounter  Procedures  . CBC with Differential    Standing Status:   Future    Standing Expiration Date:   04/17/2018  . Comprehensive metabolic panel    Standing Status:   Future    Standing Expiration Date:   04/17/2018  . Lactate dehydrogenase    Standing Status:   Future    Standing Expiration Date:   04/17/2018   All questions were answered. The patient knows to call the clinic with any problems, questions or concerns.      Cammie Sickle, MD 04/22/2017 4:47 PM

## 2017-04-17 NOTE — Assessment & Plan Note (Addendum)
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  # Currently on nivo+ipi s/p cycle #2-JAN 23rd CT scan-shows progression of disease in the lungs; cutaneous nodules ; also brain.  Given b-raf positive disease; and significant bulk of disease-recommend starting on B-raf inhibitor + MEK inhibitor.  #Start patient on dabrafenib plus Mekinist.  Discussed the potential side effects including but not limited to skin rash; fevers; also visual problems and also cardiomyopathy.  We will get a 2D echo.  # Vomiting/difficult swallowing to solids; no nausea headaches.  Etiology monitor for now if worsening recommend barium esophagogram.  No obvious reason noted on the CT scan.  # Brain metastases-status post SBR T-lesion smaller; however previously 2 mm lesion left parietal lobe increased to 9 mm edema; other 2 mm nodule noted on the right parietal area.  Currently asymptomatic.  Patient is starting on B-raf inhibitor+ mekinist-should have intracranial response also.  Also discussed with Dr. Donella Stade.  #Follow-up in approximately 10 days/[place] clinic labs.  # I reviewed the blood work- with the patient in detail; also reviewed the imaging independently [as summarized above]; and with the patient in detail.

## 2017-04-18 ENCOUNTER — Encounter: Payer: Self-pay | Admitting: *Deleted

## 2017-04-18 NOTE — Telephone Encounter (Signed)
Oral Chemotherapy Pharmacist Encounter  Patient Education I spoke and her husband in clinic following their MD appt. Provided overview of new oral chemotherapy medication: Mekinist/Tafinlar (dabrafenib/trametinib) for the treatment of Metastatic Melanoma, planned duration until disease progression or unacceptable drug toxicity.   Pt is doing well. Counseled patient on administration, dosing, side effects, monitoring, drug-food interactions, safe handling, storage, and disposal. Patient will take: - Mekinist (trametinib) 1 tablet (2 mg total) by mouth daily. Take 1 hour before or 2 hours after a meal. Store refrigerated in original container. -Tafinlar (dabrafenib) 2 capsules (150 mg total) by mouth 2 (two) times daily. Take on an empty stomach 1 hour before or 2 hours after meals.  Side effects include but not limited to:  - Mekinist: rash/itchy skin, diarrhea, decreased hgb, fluid retention - Tafinlar: decreased WBC, thickening of skin, rash/itchy skin, HA, fever, fatigue  Reviewed with patient importance of keeping a medication schedule and plan for any missed doses.  Tanya Vega voiced understanding and appreciation.  All questions answered. Medication and medication handouts were provided. She knows to get started on her medication.   Provided patient with [facility] phone number. Patient knows to call the office with questions or concerns. Oral Chemotherapy Navigation Clinic will continue to follow.  Darl Pikes, PharmD, BCPS Hematology/Oncology Clinical Pharmacist [facility] [place] [facility] [phone]  04/18/2017 2:03 PM

## 2017-04-22 ENCOUNTER — Other Ambulatory Visit: Payer: BC Managed Care – PPO

## 2017-04-22 ENCOUNTER — Ambulatory Visit: Payer: BC Managed Care – PPO

## 2017-04-22 ENCOUNTER — Ambulatory Visit: Payer: BC Managed Care – PPO | Admitting: Internal Medicine

## 2017-04-24 ENCOUNTER — Ambulatory Visit
Admission: RE | Admit: 2017-04-24 | Discharge: 2017-04-24 | Disposition: A | Discharge status: Home / Self Care | Payer: BC Managed Care – PPO | Source: Ambulatory Visit | Attending: Internal Medicine | Admitting: Internal Medicine

## 2017-04-24 DIAGNOSIS — Z79899 Other long term (current) drug therapy: Secondary | ICD-10-CM | POA: Diagnosis present

## 2017-04-24 DIAGNOSIS — E039 Hypothyroidism, unspecified: Secondary | ICD-10-CM | POA: Diagnosis not present

## 2017-04-24 DIAGNOSIS — I371 Nonrheumatic pulmonary valve insufficiency: Secondary | ICD-10-CM | POA: Insufficient documentation

## 2017-04-24 DIAGNOSIS — Z5181 Encounter for therapeutic drug level monitoring: Secondary | ICD-10-CM | POA: Insufficient documentation

## 2017-04-24 DIAGNOSIS — I081 Rheumatic disorders of both mitral and tricuspid valves: Secondary | ICD-10-CM | POA: Diagnosis not present

## 2017-04-24 DIAGNOSIS — Z85850 Personal history of malignant neoplasm of thyroid: Secondary | ICD-10-CM | POA: Diagnosis not present

## 2017-04-24 DIAGNOSIS — Z85820 Personal history of malignant melanoma of skin: Secondary | ICD-10-CM | POA: Insufficient documentation

## 2017-04-24 NOTE — Progress Notes (Signed)
*  PRELIMINARY RESULTS* Echocardiogram 2D Echocardiogram has been performed.  Sherrie Sport 04/24/2017, 10:57 AM

## 2017-04-29 ENCOUNTER — Inpatient Hospital Stay (HOSPITAL_BASED_OUTPATIENT_CLINIC_OR_DEPARTMENT_OTHER): Payer: BC Managed Care – PPO | Admitting: Internal Medicine

## 2017-04-29 ENCOUNTER — Inpatient Hospital Stay: Payer: BC Managed Care – PPO | Attending: Internal Medicine

## 2017-04-29 VITALS — BP 120/85 | HR 105 | Temp 98.6°F | Resp 16 | Wt 131.8 lb

## 2017-04-29 DIAGNOSIS — C7951 Secondary malignant neoplasm of bone: Secondary | ICD-10-CM

## 2017-04-29 DIAGNOSIS — C7801 Secondary malignant neoplasm of right lung: Secondary | ICD-10-CM | POA: Diagnosis not present

## 2017-04-29 DIAGNOSIS — C787 Secondary malignant neoplasm of liver and intrahepatic bile duct: Secondary | ICD-10-CM | POA: Insufficient documentation

## 2017-04-29 DIAGNOSIS — C7802 Secondary malignant neoplasm of left lung: Secondary | ICD-10-CM | POA: Diagnosis not present

## 2017-04-29 DIAGNOSIS — E039 Hypothyroidism, unspecified: Secondary | ICD-10-CM | POA: Insufficient documentation

## 2017-04-29 DIAGNOSIS — Z87891 Personal history of nicotine dependence: Secondary | ICD-10-CM | POA: Insufficient documentation

## 2017-04-29 DIAGNOSIS — C7931 Secondary malignant neoplasm of brain: Secondary | ICD-10-CM

## 2017-04-29 DIAGNOSIS — C439 Malignant melanoma of skin, unspecified: Secondary | ICD-10-CM | POA: Insufficient documentation

## 2017-04-29 DIAGNOSIS — E871 Hypo-osmolality and hyponatremia: Secondary | ICD-10-CM | POA: Diagnosis not present

## 2017-04-29 LAB — COMPREHENSIVE METABOLIC PANEL
ALBUMIN: 3.1 g/dL — AB (ref 3.5–5.0)
ALK PHOS: 128 U/L — AB (ref 38–126)
ALT: 26 U/L (ref 14–54)
AST: 34 U/L (ref 15–41)
Anion gap: 8 (ref 5–15)
BILIRUBIN TOTAL: 0.6 mg/dL (ref 0.3–1.2)
BUN: 7 mg/dL (ref 6–20)
CALCIUM: 7.6 mg/dL — AB (ref 8.9–10.3)
CO2: 22 mmol/L (ref 22–32)
CREATININE: 0.59 mg/dL (ref 0.44–1.00)
Chloride: 97 mmol/L — ABNORMAL LOW (ref 101–111)
GFR calc Af Amer: >60 mL/min (ref >60)
GLUCOSE: 149 mg/dL — AB (ref 65–99)
Potassium: 3.5 mmol/L (ref 3.5–5.1)
Sodium: 127 mmol/L — ABNORMAL LOW (ref 135–145)
TOTAL PROTEIN: 6.6 g/dL (ref 6.5–8.1)

## 2017-04-29 LAB — CBC WITH DIFFERENTIAL/PLATELET
BASOS ABS: 0.1 10*3/uL (ref 0–0.1)
Basophils Relative: 1 %
EOS ABS: 0 10*3/uL (ref 0–0.7)
EOS PCT: 0 %
HCT: 32.9 % — ABNORMAL LOW (ref 35.0–47.0)
Hemoglobin: 11.3 g/dL — ABNORMAL LOW (ref 12.0–16.0)
LYMPHS PCT: 16 %
Lymphs Abs: 0.9 10*3/uL — ABNORMAL LOW (ref 1.0–3.6)
MCH: 29.2 pg (ref 26.0–34.0)
MCHC: 34.5 g/dL (ref 32.0–36.0)
MCV: 84.7 fL (ref 80.0–100.0)
MONO ABS: 0.4 10*3/uL (ref 0.2–0.9)
Monocytes Relative: 7 %
Neutro Abs: 4.5 10*3/uL (ref 1.4–6.5)
Neutrophils Relative %: 76 %
PLATELETS: 400 10*3/uL (ref 150–440)
RBC: 3.88 MIL/uL (ref 3.80–5.20)
RDW: 14.8 % — ABNORMAL HIGH (ref 11.5–14.5)
WBC: 5.9 10*3/uL (ref 3.6–11.0)

## 2017-04-29 LAB — LACTATE DEHYDROGENASE: LDH: 275 U/L — AB (ref 98–192)

## 2017-04-29 NOTE — Progress Notes (Signed)
[place] OFFICE PROGRESS NOTE  Patient Care Team: Dion Body, MD as PCP - General (Family Medicine)  Cancer Staging No matching staging information was found for the patient.   Oncology History   # NOV 2018-RECURRENT METASTATIC MELANOMA MULTIPLE LIVER LESIONS/lung lesions [s/p Bx on Nov 30th]; DEc 2018- PET-multiple lung liver bone metastasis.   # Dec 11th,2018- ipi+Nivo s/p 2 cycles;  # Jan 23rd 2019 CT scan- Progression; Jan 2019- MRI brain- improved SBRT lesion; worsening 9 mm left parietal; new 2 mm right parietal  #April 17, 2017- Dabrafenib+Mekinist  # Dec 1st 2018- Brain mets- 89m [s/p SBRT- dec 21st]; and 2 other 2-4 mm.   # Bone mets- X-geva [12/04]; plan RT   # Hx of Melanoma- dec 2011 [SLNBx- 0/4-Neg;[facility] ]; Hx of thyroid cancer- feb 2001- s/p left throid lobectomy [[place]]; NO RAIU.  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  female pleasant patient above history of  metastatic melanoma-brain and liver-currently on dabrafenib + Tremetinib approximately 10 days is here for follow-up.  Patient noted to have a mild skin rash on her bilateral lower extremities; not itchy.  Denies any headaches.  Denies any nausea vomiting.  Difficulty swallowing is improving.  Denies any pain.  Denies any weakness in her legs or arms.  No chest pain or shortness of breath or cough.  She notes her skin nodules have gotten smaller.  REVIEW OF SYSTEMS:  A complete 10 point review of system is done which is negative except mentioned above/history of present illness.   PAST MEDICAL HISTORY :  Past Medical History:  Diagnosis Date  . Hypothyroidism   . Melanoma of skin ([place] 2011   Resected from Right neck area with 4 lymph nodes as well.   . [place]   Partial thyroidectomy    PAST SURGICAL HISTORY :   Past Surgical History:   Procedure Laterality Date  . BREAST BIOPSY Right 2005   neg  . BREAST BIOPSY Left 07/11/2016   radial scar  . BREAST EXCISIONAL BIOPSY Left 08/06/2016   lumpectomy for radial scar  . BREAST LUMPECTOMY WITH NEEDLE LOCALIZATION Left 08/06/2016   Procedure: BREAST LUMPECTOMY WITH NEEDLE LOCALIZATION;  Surgeon: SLeonie Green MD;  Location: [facility] ORS;  Service: General;  Laterality: Left;  .Marland KitchenMELANOMA EXCISION  2011   ear  . PORTA CATH INSERTION N/A 03/14/2017   Procedure: PORTA CATH INSERTION;  Surgeon: SKatha Cabal MD;  [facility];  Service: Cardiovascular;  Laterality: N/A;  . THYROIDECTOMY, PARTIAL Left     FAMILY HISTORY :   Family History  Problem Relation Age of Onset  . Breast cancer Mother 586 . Breast cancer Paternal Aunt   . Colon cancer Brother 521   SOCIAL HISTORY:   Social History   Tobacco Use  . Smoking status: Former Smoker    Packs/day: 0.25    Years: 20.00    Pack years: 5.00    Types: Cigarettes    Last attempt to quit: 03/26/1999    Years since quitting: 18.1  . Smokeless tobacco: Never Used  Substance Use Topics  . Alcohol use: Yes    Comment: WEEKENDS  . Drug use: No    ALLERGIES:  is allergic to tetanus-diphtheria toxoids td.  MEDICATIONS:  Current Outpatient Medications  Medication Sig Dispense Refill  .  atorvastatin (LIPITOR) 40 MG tablet Take 40 mg by mouth every evening.    . dabrafenib mesylate (TAFINLAR) 75 MG capsule Take 2 capsules (150 mg total) by mouth 2 (two) times daily. Take on an empty stomach 1 hour before or 2 hours after meals. 120 capsule 4  . levothyroxine (SYNTHROID, LEVOTHROID) 100 MCG tablet Take 100 mcg by mouth daily before breakfast.    . lidocaine-prilocaine (EMLA) cream Apply 1 application topically as needed. 30 g 4  . traMADol (ULTRAM) 50 MG tablet Take 1 tablet (50 mg total) by mouth every 6 (six) hours as needed. 60 tablet 0  . trametinib dimethyl sulfoxide (MEKINIST) 2 MG tablet  Take 1 tablet (2 mg total) by mouth daily. Take 1 hour before or 2 hours after a meal. Store refrigerated in original container. 30 tablet 4   No current facility-administered medications for this visit.     PHYSICAL EXAMINATION: ECOG PERFORMANCE STATUS: 0 - Asymptomatic  BP 120/85 (BP Location: Left Arm, Patient Position: Sitting)   Pulse (!) 105   Temp 98.6 F (37 C) (Tympanic)   Resp 16   Wt 131 lb 13.4 oz (59.8 kg)   LMP 02/29/2012 (Approximate)   BMI 22.63 kg/m   Filed Weights   04/29/17 1105  Weight: 131 lb 13.4 oz (59.8 kg)    GENERAL: Well-nourished well-developed; Alert, no distress and comfortable.   Alone. EYES: no pallor or icterus OROPHARYNX: no thrush or ulceration; good dentition  NECK: supple, no neck mass on the right side improved. LYMPH:  no palpable lymphadenopathy in the cervical, axillary or inguinal regions LUNGS: clear to auscultation and  No wheeze or crackles HEART/CVS: regular rate & rhythm and no murmurs; No lower extremity edema ABDOMEN:abdomen soft, non-tender and normal bowel sounds Musculoskeletal:no cyanosis of digits and no clubbing  PSYCH: alert & oriented x 3 with fluent speech NEURO: no focal motor/sensory deficits SKIN: 1x1 cm subcutaneous nodule noted in the right breast-lower inner quadrant; 1x1 cm subcutaneous lesion noted-right inframammary.  Mild macular skin rash noted bilateral lower extremities.  LABORATORY DATA:  I have reviewed the data as listed    Component Value Date/Time   NA 127 (L) 04/29/2017 1049   K 3.5 04/29/2017 1049   CL 97 (L) 04/29/2017 1049   CO2 22 04/29/2017 1049   GLUCOSE 149 (H) 04/29/2017 1049   BUN 7 04/29/2017 1049   CREATININE 0.59 04/29/2017 1049   CALCIUM 7.6 (L) 04/29/2017 1049   PROT 6.6 04/29/2017 1049   ALBUMIN 3.1 (L) 04/29/2017 1049   AST 34 04/29/2017 1049   ALT 26 04/29/2017 1049   ALKPHOS 128 (H) 04/29/2017 1049   BILITOT 0.6 04/29/2017 1049   GFRNONAA >60 04/29/2017 1049   GFRAA  >60 04/29/2017 1049    No results found for: SPEP, UPEP  Lab Results  Component Value Date   WBC 5.9 04/29/2017   NEUTROABS 4.5 04/29/2017   HGB 11.3 (L) 04/29/2017   HCT 32.9 (L) 04/29/2017   MCV 84.7 04/29/2017   PLT 400 04/29/2017      Chemistry      Component Value Date/Time   NA 127 (L) 04/29/2017 1049   K 3.5 04/29/2017 1049   CL 97 (L) 04/29/2017 1049   CO2 22 04/29/2017 1049   BUN 7 04/29/2017 1049   CREATININE 0.59 04/29/2017 1049      Component Value Date/Time   CALCIUM 7.6 (L) 04/29/2017 1049   ALKPHOS 128 (H) 04/29/2017 1049   AST 34 04/29/2017  1049   ALT 26 04/29/2017 1049   BILITOT 0.6 04/29/2017 1049       RADIOGRAPHIC STUDIES: I have personally reviewed the radiological images as listed and agreed with the findings in the report. No results found.   ASSESSMENT & PLAN:  Metastatic melanoma to liver ([facility]) Recurrent metastatic melanoma BRAF POSITIVE- multiple metastatic lesions to the liver; bilateral lung nodules; L1 vertebral compression fracture. S/p nivo+ipi  cycle #2-JAN 23rd CT scan-shows progression of disease in the lungs; cutaneous nodules; also brain.   # ON B-raf inhibitor + MEK inhibitor- Dabrafenib+ Tremetinib-for the last 10 days; response noted-improvement of the subcutaneous nodules left inframammary/right breast nodule.  Patient tolerated treatment without any major side effects except for mild skin rash   #Mild skin rash bilateral lower extremities-asymptomatic; if itchy recommend hydrocortisone cream.  # Brain metastases-status post SBR T-lesion smaller;MRI Jan 2019-however previously 2 mm lesion left parietal lobe increased to 9 mm edema; other 2 mm nodule noted on the right parietal area.  Currently asymptomatic.  We will get an MRI in 6 weeks for the last scan  # hyponatremia- 127; limit free water 1-1.5 L a day; continue/increase Gatorade salt laden soups etc.  # Follow-up in approximately 2 weeks/labs.    Orders Placed This  Encounter  Procedures  . CBC with Differential/Platelet    Standing Status:   Future    Standing Expiration Date:   04/29/2018  . Comprehensive metabolic panel    Standing Status:   Future    Standing Expiration Date:   04/29/2018  . Lactate dehydrogenase    Standing Status:   Future    Standing Expiration Date:   04/29/2018   All questions were answered. The patient knows to call the clinic with any problems, questions or concerns.      Cammie Sickle, MD 04/29/2017 11:35 AM

## 2017-04-29 NOTE — Assessment & Plan Note (Addendum)
Recurrent metastatic melanoma BRAF POSITIVE- multiple metastatic lesions to the liver; bilateral lung nodules; L1 vertebral compression fracture. S/p nivo+ipi  cycle #2-JAN 23rd CT scan-shows progression of disease in the lungs; cutaneous nodules; also brain.   # ON B-raf inhibitor + MEK inhibitor- Dabrafenib+ Tremetinib-for the last 10 days; response noted-improvement of the subcutaneous nodules left inframammary/right breast nodule.  Patient tolerated treatment without any major side effects except for mild skin rash   #Mild skin rash bilateral lower extremities-asymptomatic; if itchy recommend hydrocortisone cream.  # Brain metastases-status post SBR T-lesion smaller;MRI Jan 2019-however previously 2 mm lesion left parietal lobe increased to 9 mm edema; other 2 mm nodule noted on the right parietal area.  Currently asymptomatic.  We will get an MRI in 6 weeks for the last scan  # hyponatremia- 127; limit free water 1-1.5 L a day; continue/increase Gatorade salt laden soups etc.  # Follow-up in approximately 2 weeks/labs.

## 2017-05-09 ENCOUNTER — Ambulatory Visit: Payer: BC Managed Care – PPO | Admitting: Radiation Oncology

## 2017-05-13 ENCOUNTER — Inpatient Hospital Stay (HOSPITAL_BASED_OUTPATIENT_CLINIC_OR_DEPARTMENT_OTHER): Payer: BC Managed Care – PPO | Admitting: Internal Medicine

## 2017-05-13 ENCOUNTER — Inpatient Hospital Stay: Payer: BC Managed Care – PPO

## 2017-05-13 ENCOUNTER — Telehealth: Payer: Self-pay | Admitting: Internal Medicine

## 2017-05-13 ENCOUNTER — Encounter: Payer: Self-pay | Admitting: Internal Medicine

## 2017-05-13 VITALS — BP 112/79 | HR 92 | Temp 97.9°F | Resp 16 | Wt 132.0 lb

## 2017-05-13 DIAGNOSIS — C439 Malignant melanoma of skin, unspecified: Secondary | ICD-10-CM | POA: Diagnosis not present

## 2017-05-13 DIAGNOSIS — C7801 Secondary malignant neoplasm of right lung: Secondary | ICD-10-CM

## 2017-05-13 DIAGNOSIS — C7931 Secondary malignant neoplasm of brain: Secondary | ICD-10-CM | POA: Diagnosis not present

## 2017-05-13 DIAGNOSIS — E871 Hypo-osmolality and hyponatremia: Secondary | ICD-10-CM | POA: Diagnosis not present

## 2017-05-13 DIAGNOSIS — C787 Secondary malignant neoplasm of liver and intrahepatic bile duct: Secondary | ICD-10-CM

## 2017-05-13 DIAGNOSIS — E039 Hypothyroidism, unspecified: Secondary | ICD-10-CM

## 2017-05-13 DIAGNOSIS — C7802 Secondary malignant neoplasm of left lung: Secondary | ICD-10-CM

## 2017-05-13 LAB — COMPREHENSIVE METABOLIC PANEL
ALK PHOS: 161 U/L — AB (ref 38–126)
ALT: 39 U/L (ref 14–54)
AST: 49 U/L — ABNORMAL HIGH (ref 15–41)
Albumin: 2.6 g/dL — ABNORMAL LOW (ref 3.5–5.0)
Anion gap: 4 — ABNORMAL LOW (ref 5–15)
BUN: 7 mg/dL (ref 6–20)
CALCIUM: 7.4 mg/dL — AB (ref 8.9–10.3)
CO2: 26 mmol/L (ref 22–32)
Chloride: 103 mmol/L (ref 101–111)
Creatinine, Ser: 0.35 mg/dL — ABNORMAL LOW (ref 0.44–1.00)
GLUCOSE: 92 mg/dL (ref 65–99)
POTASSIUM: 3 mmol/L — AB (ref 3.5–5.1)
Sodium: 133 mmol/L — ABNORMAL LOW (ref 135–145)
TOTAL PROTEIN: 5.7 g/dL — AB (ref 6.5–8.1)
Total Bilirubin: 0.3 mg/dL (ref 0.3–1.2)

## 2017-05-13 LAB — CBC WITH DIFFERENTIAL/PLATELET
BASOS ABS: 0 10*3/uL (ref 0–0.1)
BASOS PCT: 1 %
Eosinophils Absolute: 0.1 10*3/uL (ref 0–0.7)
Eosinophils Relative: 3 %
HEMATOCRIT: 28 % — AB (ref 35.0–47.0)
Hemoglobin: 9.6 g/dL — ABNORMAL LOW (ref 12.0–16.0)
LYMPHS PCT: 31 %
Lymphs Abs: 1.4 10*3/uL (ref 1.0–3.6)
MCH: 29.4 pg (ref 26.0–34.0)
MCHC: 34.4 g/dL (ref 32.0–36.0)
MCV: 85.4 fL (ref 80.0–100.0)
MONO ABS: 0.5 10*3/uL (ref 0.2–0.9)
Monocytes Relative: 10 %
NEUTROS ABS: 2.6 10*3/uL (ref 1.4–6.5)
NEUTROS PCT: 55 %
PLATELETS: 217 10*3/uL (ref 150–440)
RBC: 3.28 MIL/uL — AB (ref 3.80–5.20)
RDW: 15.7 % — ABNORMAL HIGH (ref 11.5–14.5)
WBC: 4.7 10*3/uL (ref 3.6–11.0)

## 2017-05-13 LAB — LACTATE DEHYDROGENASE: LDH: 332 U/L — ABNORMAL HIGH (ref 98–192)

## 2017-05-13 MED ORDER — SODIUM CHLORIDE 0.9% FLUSH
10.0000 mL | INTRAVENOUS | Status: DC | PRN
  Administered 2017-05-13: 10 mL via INTRAVENOUS
  Filled 2017-05-13: qty 10

## 2017-05-13 MED ORDER — HEPARIN SOD (PORK) LOCK FLUSH 100 UNIT/ML IV SOLN
500.0000 [IU] | Freq: Once | INTRAVENOUS | Status: AC
  Administered 2017-05-13: 500 [IU] via INTRAVENOUS

## 2017-05-13 MED ORDER — HEPARIN SOD (PORK) LOCK FLUSH 100 UNIT/ML IV SOLN
INTRAVENOUS | Status: AC
  Filled 2017-05-13: qty 5

## 2017-05-13 MED FILL — TAFINLAR 75 MG CAPSULE: 75 | 30 days supply | Qty: 120 | Fill #1

## 2017-05-13 MED FILL — MEKINIST 2 MG TAB: 2 | 30 days supply | Qty: 30 | Fill #1

## 2017-05-13 NOTE — Telephone Encounter (Signed)
Oral Oncology Patient Advocate Encounter  Per Tanya Vega at [facility] when she submitted drugs Mekinist and [place] thru her insurance this moth there was a 0.00 co-pay. I looked at the letters we received from her insurance that as long as she has this insurance there will not be a co-pay.    [place] Patient Advocate [phone] 05/13/2017 10:31 AM

## 2017-05-13 NOTE — Progress Notes (Signed)
[place] OFFICE PROGRESS NOTE  Patient Care Team: Dion Body, MD as PCP - General (Family Medicine)  Cancer Staging No matching staging information was found for the patient.   Oncology History   # NOV 2018-RECURRENT METASTATIC MELANOMA MULTIPLE LIVER LESIONS/lung lesions [s/p Bx on Nov 30th]; DEc 2018- PET-multiple lung liver bone metastasis.   # Dec 11th,2018- ipi+Nivo s/p 2 cycles;  # Jan 23rd 2019 CT scan- Progression; Jan 2019- MRI brain- improved SBRT lesion; worsening 9 mm left parietal; new 2 mm right parietal  #April 19, 2017- Dabrafenib+Mekinist  # Dec 1st 2018- Brain mets- 4m [s/p SBRT- dec 21st]; and 2 other 2-4 mm.   # Bone mets- X-geva [12/04]; plan RT   # Hx of Melanoma- dec 2011 [SLNBx- 0/4-Neg;[facility] ]; Hx of thyroid cancer- feb 2001- s/p left throid lobectomy [[place]]; NO RAIU.  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  female pleasant patient above history of  metastatic melanoma-brain and liver-currently on dabrafenib + Tremetinib [jan 26th] is here for follow-up.  Patient noted to have improvement of her left chest wall subcutaneous nodule/and also right breast nodule.  Complains of dry skin.  Otherwise no rash.  Denies any headaches.  Denies any nausea vomiting.  Difficulty swallowing is improving.  Denies any pain.  Denies any weakness in her legs or arms.  No chest pain or shortness of breath or cough.  Complains of mild fatigue.  Appetite is good.  REVIEW OF SYSTEMS:  A complete 10 point review of system is done which is negative except mentioned above/history of present illness.   PAST MEDICAL HISTORY :  Past Medical History:  Diagnosis Date  . Hypothyroidism   . [place]   Resected from Right neck area with 4 lymph nodes as well.   . Thy[place] cancer ([facility]   Partial thyroidectomy     PAST SURGICAL HISTORY :   Past Surgical History:  Procedure Laterality Date  . BREAST BIOPSY Right 2005   neg  . BREAST BIOPSY Left 07/11/2016   radial scar  . BREAST EXCISIONAL BIOPSY Left 08/06/2016   lumpectomy for radial scar  . BREAST LUMPECTOMY WITH NEEDLE LOCALIZATION Left 08/06/2016   Procedure: BREAST LUMPECTOMY WITH NEEDLE LOCALIZATION;  Surgeon: SLeonie Green MD;  Location: [facility] ORS;  Service: General;  Laterality: Left;  .Marland KitchenMELANOMA EXCISION  2011   ear  . PORTA CATH INSERTION N/A 03/14/2017   Procedure: PORTA CATH INSERTION;  Surgeon: SKatha Cabal MD;  [facility];  Service: Cardiovascular;  Laterality: N/A;  . THYROIDECTOMY, PARTIAL Left     FAMILY HISTORY :   Family History  Problem Relation Age of Onset  . Breast cancer Mother 552 . Breast cancer Paternal Aunt   . Colon cancer Brother 558   SOCIAL HISTORY:   Social History   Tobacco Use  . Smoking status: Former Smoker    Packs/day: 0.25    Years: 20.00    Pack years: 5.00    Types: Cigarettes    Last attempt to quit: 03/26/1999    Years since quitting: 18.1  . Smokeless tobacco: Never Used  Substance Use Topics  . Alcohol use: Yes    Comment: WEEKENDS  . Drug use: No    ALLERGIES:  is allergic to tetanus-diphtheria toxoids td.  MEDICATIONS:  Current Outpatient Medications  Medication Sig Dispense Refill  . atorvastatin (LIPITOR) 40 MG tablet Take 40 mg by mouth every evening.    . dabrafenib mesylate (TAFINLAR) 75 MG capsule Take 2 capsules (150 mg total) by mouth 2 (two) times daily. Take on an empty stomach 1 hour before or 2 hours after meals. 120 capsule 4  . levothyroxine (SYNTHROID, LEVOTHROID) 100 MCG tablet Take 100 mcg by mouth daily before breakfast.    . lidocaine-prilocaine (EMLA) cream Apply 1 application topically as needed. 30 g 4  . traMADol (ULTRAM) 50 MG tablet Take 1 tablet (50 mg total) by mouth every 6 (six) hours as needed. 60 tablet 0  .  trametinib dimethyl sulfoxide (MEKINIST) 2 MG tablet Take 1 tablet (2 mg total) by mouth daily. Take 1 hour before or 2 hours after a meal. Store refrigerated in original container. 30 tablet 4   No current facility-administered medications for this visit.    Facility-Administered Medications Ordered in Other Visits  Medication Dose Route Frequency Provider Last Rate Last Dose  . sodium chloride flush (NS) 0.9 % injection 10 mL  10 mL Intravenous PRN Cammie Sickle, MD   10 mL at 05/13/17 1058    PHYSICAL EXAMINATION: ECOG PERFORMANCE STATUS: 0 - Asymptomatic  BP 112/79 (BP Location: Left Arm, Patient Position: Sitting)   Pulse 92   Temp 97.9 F (36.6 C)   Resp 16   Wt 132 lb (59.9 kg)   LMP 02/29/2012 (Approximate)   BMI 22.66 kg/m   Filed Weights   05/13/17 1125  Weight: 132 lb (59.9 kg)    GENERAL: Well-nourished well-developed; Alert, no distress and comfortable.   With her husband. Marland Kitchen EYES: no pallor or icterus OROPHARYNX: no thrush or ulceration; good dentition  NECK: supple, no neck mass on the right side improved. LYMPH:  no palpable lymphadenopathy in the cervical, axillary or inguinal regions LUNGS: clear to auscultation and  No wheeze or crackles HEART/CVS: regular rate & rhythm and no murmurs; No lower extremity edema ABDOMEN:abdomen soft, non-tender and normal bowel sounds Musculoskeletal:no cyanosis of digits and no clubbing  PSYCH: alert & oriented x 3 with fluent speech NEURO: no focal motor/sensory deficits SKIN: 1x1 cm subcutaneous nodule noted in the right breast-lower inner quadrant; 1x1 cm subcutaneous lesion noted-right inframammary [improved]   Mild macular skin rash noted bilateral lower extremities.  LABORATORY DATA:  I have reviewed the data as listed    Component Value Date/Time   NA 133 (L) 05/13/2017 1100   K 3.0 (L) 05/13/2017 1100   CL 103 05/13/2017 1100   CO2 26 05/13/2017 1100   GLUCOSE 92 05/13/2017 1100   BUN 7 05/13/2017  1100   CREATININE 0.35 (L) 05/13/2017 1100   CALCIUM 7.4 (L) 05/13/2017 1100   PROT 5.7 (L) 05/13/2017 1100   ALBUMIN 2.6 (L) 05/13/2017 1100   AST 49 (H) 05/13/2017 1100   ALT 39 05/13/2017 1100   ALKPHOS 161 (H) 05/13/2017 1100   BILITOT 0.3 05/13/2017 1100   GFRNONAA >60 05/13/2017 1100   GFRAA >60 05/13/2017 1100    No results found for: SPEP, UPEP  Lab Results  Component Value Date   WBC 4.7 05/13/2017   NEUTROABS 2.6 05/13/2017   HGB 9.6 (L) 05/13/2017   HCT 28.0 (L) 05/13/2017   MCV 85.4 05/13/2017   PLT 217 05/13/2017      Chemistry      Component Value Date/Time   NA 133 (L) 05/13/2017 1100  K 3.0 (L) 05/13/2017 1100   CL 103 05/13/2017 1100   CO2 26 05/13/2017 1100   BUN 7 05/13/2017 1100   CREATININE 0.35 (L) 05/13/2017 1100      Component Value Date/Time   CALCIUM 7.4 (L) 05/13/2017 1100   ALKPHOS 161 (H) 05/13/2017 1100   AST 49 (H) 05/13/2017 1100   ALT 39 05/13/2017 1100   BILITOT 0.3 05/13/2017 1100       RADIOGRAPHIC STUDIES: I have personally reviewed the radiological images as listed and agreed with the findings in the report. No results found.   ASSESSMENT & PLAN:  Metastatic melanoma to liver (HCC) Recurrent metastatic melanoma BRAF POSITIVE- multiple metastatic lesions to the liver; bilateral lung nodules; L1 vertebral compression fracture. JAN 23rd CT scan-shows progression of disease in the lungs; cutaneous nodules; also brain.   Currently on B-raf inhibitor + MEK inhibitor- Dabrafenib+ Tremetinib [started jan 26th 2019] significant partial clinical response noted-improvement of the subcutaneous nodules left inframammary/right breast nodule.    #Patient tolerated treatment without any major side effects except for mild skin dryness.  # Brain metastases-status post SBR T-lesion smaller;MRI Jan 2019-however previously 2 mm lesion left parietal lobe increased to 9 mm edema; other 2 mm nodule noted on the right parietal area.  Currently  asymptomatic.  Will repeat MRI in the next 4 weeks.  #Hyponatremia improving.  Continue salt laden foods.  # Follow-up in approximately 2 weeks/labs.    Orders Placed This Encounter  Procedures  . CBC with Differential/Platelet    Standing Status:   Future    Standing Expiration Date:   05/13/2018  . Comprehensive metabolic panel    Standing Status:   Future    Standing Expiration Date:   05/13/2018  . Lactate dehydrogenase    Standing Status:   Future    Standing Expiration Date:   05/13/2018   All questions were answered. The patient knows to call the clinic with any problems, questions or concerns.      Cammie Sickle, MD 05/13/2017 2:19 PM

## 2017-05-13 NOTE — Assessment & Plan Note (Addendum)
Recurrent metastatic melanoma BRAF POSITIVE- multiple metastatic lesions to the liver; bilateral lung nodules; L1 vertebral compression fracture. JAN 23rd CT scan-shows progression of disease in the lungs; cutaneous nodules; also brain.   Currently on B-raf inhibitor + MEK inhibitor- Dabrafenib+ Tremetinib [started jan 26th 2019] significant partial clinical response noted-improvement of the subcutaneous nodules left inframammary/right breast nodule.    #Patient tolerated treatment without any major side effects except for mild skin dryness.  # Brain metastases-status post SBR T-lesion smaller;MRI Jan 2019-however previously 2 mm lesion left parietal lobe increased to 9 mm edema; other 2 mm nodule noted on the right parietal area.  Currently asymptomatic.  Will repeat MRI in the next 4 weeks.  #Hyponatremia improving.  Continue salt laden foods.  # Follow-up in approximately 2 weeks/labs.

## 2017-05-23 ENCOUNTER — Other Ambulatory Visit: Payer: Self-pay

## 2017-05-23 ENCOUNTER — Encounter: Payer: Self-pay | Admitting: Radiation Oncology

## 2017-05-23 ENCOUNTER — Ambulatory Visit
Admission: RE | Admit: 2017-05-23 | Discharge: 2017-05-23 | Disposition: A | Discharge status: Home / Self Care | Payer: BC Managed Care – PPO | Source: Ambulatory Visit | Attending: Radiation Oncology | Admitting: Radiation Oncology

## 2017-05-23 VITALS — BP 114/78 | HR 100 | Temp 96.8°F | Resp 18 | Wt 127.9 lb

## 2017-05-23 DIAGNOSIS — C7931 Secondary malignant neoplasm of brain: Secondary | ICD-10-CM | POA: Diagnosis not present

## 2017-05-23 DIAGNOSIS — C7951 Secondary malignant neoplasm of bone: Secondary | ICD-10-CM | POA: Insufficient documentation

## 2017-05-23 DIAGNOSIS — Z923 Personal history of irradiation: Secondary | ICD-10-CM | POA: Insufficient documentation

## 2017-05-23 DIAGNOSIS — Z79899 Other long term (current) drug therapy: Secondary | ICD-10-CM | POA: Diagnosis not present

## 2017-05-23 DIAGNOSIS — C439 Malignant melanoma of skin, unspecified: Secondary | ICD-10-CM | POA: Insufficient documentation

## 2017-05-23 NOTE — Progress Notes (Signed)
Radiation Oncology Follow up Note  Name: Tanya Vega   Date:   05/23/2017 MRN:  [id number] DOB: 12/26/1960    This 57 y.o. female presents to the clinic today for one-month follow-up status post brain and spine radiation for palliation for stage IV metastatic malignant melanoma.  REFERRING PROVIDER: Dion Body, MD  HPI: Patient is a 57 year old female now out 1 month having completed brain and spine radiation therapy for widespread metastatic malignant melanoma.. She is currently on currently on dabrafenib + Tremetinib and tolerating that well. She specifically denies any back pain any change in visual fields any focal neurologic deficits. She does have good response by MRI scan on January 2019 however there is a left parietal lobe lesion that is increased from 2 mm to 9 mm. There is also another 2 mm nodule on the right parietal region and she is scheduled for repeat MRI scan the next 4 weeks.    COMPLICATIONS OF TREATMENT: none  FOLLOW UP COMPLIANCE: keeps appointments   PHYSICAL EXAM:  BP 114/78   Pulse 100   Temp (!) 96.8 F (36 C)   Resp 18   Wt 127 lb 13.9 oz (58 kg)   LMP 02/29/2012 (Approximate)   BMI 21.95 kg/m  Well-developed well-nourished patient in NAD. HEENT reveals PERLA, EOMI, discs not visualized.  Oral cavity is clear. No oral mucosal lesions are identified. Neck is clear without evidence of cervical or supraclavicular adenopathy. Lungs are clear to A&P. Cardiac examination is essentially unremarkable with regular rate and rhythm without murmur rub or thrill. Abdomen is benign with no organomegaly or masses noted. Motor sensory and DTR levels are equal and symmetric in the upper and lower extremities. Cranial nerves II through XII are grossly intact. Proprioception is intact. No peripheral adenopathy or edema is identified. No motor or sensory levels are noted. Crude visual fields are within normal range.  RADIOLOGY RESULTS: MRI scans of brain reviewed and  compatible above-stated findings   PLAN: At this time patient will continue under the care of medical oncology. I will be happy to reevaluate the patient any time should further palliative treatment be indicated. Should she have progressive disease in the brain would consider whole brain radiation therapy. Also would reevaluate her any time for further palliative treatment.  I would like to take this opportunity to thank you for allowing me to participate in the care of your patient.Noreene Filbert, MD

## 2017-05-27 ENCOUNTER — Inpatient Hospital Stay: Payer: BC Managed Care – PPO | Attending: Internal Medicine | Admitting: Internal Medicine

## 2017-05-27 ENCOUNTER — Inpatient Hospital Stay: Payer: BC Managed Care – PPO

## 2017-05-27 ENCOUNTER — Other Ambulatory Visit: Payer: Self-pay

## 2017-05-27 VITALS — BP 131/85 | HR 96 | Temp 97.5°F | Resp 18 | Wt 129.6 lb

## 2017-05-27 DIAGNOSIS — E039 Hypothyroidism, unspecified: Secondary | ICD-10-CM | POA: Insufficient documentation

## 2017-05-27 DIAGNOSIS — C439 Malignant melanoma of skin, unspecified: Secondary | ICD-10-CM | POA: Diagnosis not present

## 2017-05-27 DIAGNOSIS — C787 Secondary malignant neoplasm of liver and intrahepatic bile duct: Secondary | ICD-10-CM

## 2017-05-27 DIAGNOSIS — C7802 Secondary malignant neoplasm of left lung: Secondary | ICD-10-CM

## 2017-05-27 DIAGNOSIS — C7800 Secondary malignant neoplasm of unspecified lung: Secondary | ICD-10-CM | POA: Insufficient documentation

## 2017-05-27 DIAGNOSIS — C7801 Secondary malignant neoplasm of right lung: Secondary | ICD-10-CM | POA: Diagnosis not present

## 2017-05-27 DIAGNOSIS — C7931 Secondary malignant neoplasm of brain: Secondary | ICD-10-CM | POA: Diagnosis not present

## 2017-05-27 DIAGNOSIS — C7951 Secondary malignant neoplasm of bone: Secondary | ICD-10-CM | POA: Insufficient documentation

## 2017-05-27 DIAGNOSIS — C434 Malignant melanoma of scalp and neck: Secondary | ICD-10-CM | POA: Diagnosis not present

## 2017-05-27 LAB — COMPREHENSIVE METABOLIC PANEL
ALK PHOS: 137 U/L — AB (ref 38–126)
ALT: 34 U/L (ref 14–54)
ANION GAP: 7 (ref 5–15)
AST: 44 U/L — ABNORMAL HIGH (ref 15–41)
Albumin: 3.3 g/dL — ABNORMAL LOW (ref 3.5–5.0)
BUN: 6 mg/dL (ref 6–20)
CALCIUM: 8.5 mg/dL — AB (ref 8.9–10.3)
CO2: 27 mmol/L (ref 22–32)
Chloride: 103 mmol/L (ref 101–111)
Creatinine, Ser: 0.55 mg/dL (ref 0.44–1.00)
Glucose, Bld: 96 mg/dL (ref 65–99)
Potassium: 3.7 mmol/L (ref 3.5–5.1)
Sodium: 137 mmol/L (ref 135–145)
Total Bilirubin: 0.3 mg/dL (ref 0.3–1.2)
Total Protein: 6.7 g/dL (ref 6.5–8.1)

## 2017-05-27 LAB — CBC WITH DIFFERENTIAL/PLATELET
BASOS PCT: 1 %
Basophils Absolute: 0 10*3/uL (ref 0–0.1)
EOS ABS: 0.1 10*3/uL (ref 0–0.7)
Eosinophils Relative: 3 %
HCT: 32.9 % — ABNORMAL LOW (ref 35.0–47.0)
HEMOGLOBIN: 11.1 g/dL — AB (ref 12.0–16.0)
Lymphocytes Relative: 33 %
Lymphs Abs: 1.1 10*3/uL (ref 1.0–3.6)
MCH: 29.6 pg (ref 26.0–34.0)
MCHC: 33.6 g/dL (ref 32.0–36.0)
MCV: 88 fL (ref 80.0–100.0)
MONOS PCT: 13 %
Monocytes Absolute: 0.4 10*3/uL (ref 0.2–0.9)
NEUTROS PCT: 50 %
Neutro Abs: 1.6 10*3/uL (ref 1.4–6.5)
PLATELETS: 232 10*3/uL (ref 150–440)
RBC: 3.74 MIL/uL — ABNORMAL LOW (ref 3.80–5.20)
RDW: 16.7 % — ABNORMAL HIGH (ref 11.5–14.5)
WBC: 3.3 10*3/uL — ABNORMAL LOW (ref 3.6–11.0)

## 2017-05-27 LAB — LACTATE DEHYDROGENASE: LDH: 237 U/L — ABNORMAL HIGH (ref 98–192)

## 2017-05-27 NOTE — Assessment & Plan Note (Addendum)
Recurrent metastatic melanoma BRAF POSITIVE- multiple metastatic lesions to the liver; bilateral lung nodules; L1 vertebral compression fracture. JAN 23rd CT scan-shows progression of disease in the lungs; cutaneous nodules; also brain.   Currently on II line therapy with B-raf inhibitor + MEK inhibitor- Dabrafenib+ Tremetinib [started jan 26th 2019] significant partial clinical response noted-improvement of the subcutaneous nodules left inframammary/right breast nodule.    #Patient tolerated treatment without any major side effects.   # Brain metastases-status post SBR T-lesion smaller;MRI Jan 2019-however previously 2 mm lesion left parietal lobe increased to 9 mm edema; other 2 mm nodule noted on the right parietal area.  Currently asymptomatic.  Will repeat MRI   #Hyponatremia improved; sodium 137 today.   #Discussed with patient regarding getting a second opinion/possibility for clinical trials down the line when she will progress on the current therapy-discussed options of [facility].  She will let us know  # Follow-up in approximately 3 weeks/labs; CT scan/ MRI brain.

## 2017-05-27 NOTE — Progress Notes (Signed)
Here for follow up. Overall stated " doing well "

## 2017-05-27 NOTE — Progress Notes (Signed)
[place] OFF[facility] PROGRESS NOTE  Patient Care Team: Dion Body, MD as PCP - General (Family Medicine)  Cancer Staging No matching staging information was found for the patient.   Oncology History   # NOV 2018-RECURRENT METASTATIC MELANOMA MULTIPLE LIVER LESIONS/lung lesions [s/p Bx on Nov 30th]; DEc 2018- PET-multiple lung liver bone metastasis.   # Dec 11th,2018- ipi+Nivo s/p 2 cycles;  # Jan 23rd 2019 CT scan- Progression; Jan 2019- MRI brain- improved SBRT lesion; worsening 9 mm left parietal; new 2 mm right parietal  #April 19, 2017- Dabrafenib+Mekinist  # Dec 1st 2018- Brain mets- 13m [s/p SBRT- dec 21st]; and 2 other 2-4 mm.   # Bone mets- X-geva [12/04]; plan RT   # Hx of Melanoma- dec 2011 [SLNBx- 0/4-Neg;[facility] ]; Hx of thyroid cancer- feb 2001- s/p left throid lobectomy [[place]]; NO RAIU.  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  female pleasant patient above history of  metastatic melanoma-brain and liver-currently on dabrafenib + Tremetinib [jan 26th] is here for follow-up.  Patient noted to have improvement of her left chest wall subcutaneous nodule/and also right breast nodule.  Complains of dry skin.  Otherwise no rash.  Denies any headaches.  Denies any nausea vomiting.   Denies any pain.  Denies any fevers chills vision changes.  Appetite is good.  However she has lost about 3 pounds.   REVIEW OF SYSTEMS:  A complete 10 point review of system is done which is negative except mentioned above/history of present illness.   PAST MEDICAL HISTORY :  Past Medical History:  Diagnosis Date  . Hypothyroidism   . [place]   Resected from Right neck area with 4 lymph nodes as well.   . [place]   Partial thyroidectomy    PAST SURGICAL HISTORY :   Past Surgical History:  Procedure  Laterality Date  . BREAST BIOPSY Right 2005   neg  . BREAST BIOPSY Left 07/11/2016   radial scar  . BREAST EXCISIONAL BIOPSY Left 08/06/2016   lumpectomy for radial scar  . BREAST LUMPECTOMY WITH NEEDLE LOCALIZATION Left 08/06/2016   Procedure: BREAST LUMPECTOMY WITH NEEDLE LOCALIZATION;  Surgeon: SLeonie Green MD;  Location: [facility] ORS;  Service: General;  Laterality: Left;  .Marland KitchenMELANOMA EXCISION  2011   ear  . PORTA CATH INSERTION N/A 03/14/2017   Procedure: PORTA CATH INSERTION;  Surgeon: SKatha Cabal MD;  [facility];  Service: Cardiovascular;  Laterality: N/A;  . THYROIDECTOMY, PARTIAL Left     FAMILY HISTORY :   Family History  Problem Relation Age of Onset  . Breast cancer Mother 565 . Breast cancer Paternal Aunt   . Colon cancer Brother 590   SOCIAL HISTORY:   Social History   Tobacco Use  . Smoking status: Former Smoker    Packs/day: 0.25    Years: 20.00    Pack years: 5.00    Types: Cigarettes    Last attempt to quit: 03/26/1999    Years since quitting: 18.1  . Smokeless tobacco: Never Used  Substance Use Topics  . Alcohol use: Yes    Comment: WEEKENDS  . Drug use: No    ALLERGIES:  is allergic to tetanus-diphtheria toxoids td.  MEDICATIONS:  Current Outpatient Medications  Medication Sig Dispense Refill  . atorvastatin (LIPITOR)  40 MG tablet Take 40 mg by mouth every evening.    . dabrafenib mesylate (TAFINLAR) 75 MG capsule Take 2 capsules (150 mg total) by mouth 2 (two) times daily. Take on an empty stomach 1 hour before or 2 hours after meals. 120 capsule 4  . levothyroxine (SYNTHROID, LEVOTHROID) 100 MCG tablet Take 100 mcg by mouth daily before breakfast.    . lidocaine-prilocaine (EMLA) cream Apply 1 application topically as needed. 30 g 4  . trametinib dimethyl sulfoxide (MEKINIST) 2 MG tablet Take 1 tablet (2 mg total) by mouth daily. Take 1 hour before or 2 hours after a meal. Store refrigerated in original container.  30 tablet 4  . traMADol (ULTRAM) 50 MG tablet Take 1 tablet (50 mg total) by mouth every 6 (six) hours as needed. (Patient not taking: Reported on 05/27/2017) 60 tablet 0   No current facility-administered medications for this visit.     PHYSICAL EXAMINATION: ECOG PERFORMANCE STATUS: 0 - Asymptomatic  BP 131/85 (BP Location: Left Arm, Patient Position: Sitting)   Pulse 96   Temp (!) 97.5 F (36.4 C) (Tympanic)   Resp 18   Wt 129 lb 10.1 oz (58.8 kg)   LMP 02/29/2012 (Approximate)   BMI 22.25 kg/m   Filed Weights   05/27/17 0831  Weight: 129 lb 10.1 oz (58.8 kg)    GENERAL: Well-nourished well-developed; Alert, no distress and comfortable.   With her husband. Marland Kitchen EYES: no pallor or icterus OROPHARYNX: no thrush or ulceration; good dentition  NECK: supple, no neck mass on the right side improved. LYMPH:  no palpable lymphadenopathy in the cervical, axillary or inguinal regions LUNGS: clear to auscultation and  No wheeze or crackles HEART/CVS: regular rate & rhythm and no murmurs; No lower extremity edema ABDOMEN:abdomen soft, non-tender and normal bowel sounds Musculoskeletal:no cyanosis of digits and no clubbing  PSYCH: alert & oriented x 3 with fluent speech NEURO: no focal motor/sensory deficits SKIN: 1x1 cm subcutaneous nodule noted in the right breast-lower inner quadrant; 1x1 cm subcutaneous lesion noted-right inframammary [improved]   Mild macular skin rash noted bilateral lower extremities.  LABORATORY DATA:  I have reviewed the data as listed    Component Value Date/Time   NA 137 05/27/2017 0812   K 3.7 05/27/2017 0812   CL 103 05/27/2017 0812   CO2 27 05/27/2017 0812   GLUCOSE 96 05/27/2017 0812   BUN 6 05/27/2017 0812   CREATININE 0.55 05/27/2017 0812   CALCIUM 8.5 (L) 05/27/2017 0812   PROT 6.7 05/27/2017 0812   ALBUMIN 3.3 (L) 05/27/2017 0812   AST 44 (H) 05/27/2017 0812   ALT 34 05/27/2017 0812   ALKPHOS 137 (H) 05/27/2017 0812   BILITOT 0.3 05/27/2017  0812   GFRNONAA >60 05/27/2017 0812   GFRAA >60 05/27/2017 0812    No results found for: SPEP, UPEP  Lab Results  Component Value Date   WBC 3.3 (L) 05/27/2017   NEUTROABS 1.6 05/27/2017   HGB 11.1 (L) 05/27/2017   HCT 32.9 (L) 05/27/2017   MCV 88.0 05/27/2017   PLT 232 05/27/2017      Chemistry      Component Value Date/Time   NA 137 05/27/2017 0812   K 3.7 05/27/2017 0812   CL 103 05/27/2017 0812   CO2 27 05/27/2017 0812   BUN 6 05/27/2017 0812   CREATININE 0.55 05/27/2017 0812      Component Value Date/Time   CALCIUM 8.5 (L) 05/27/2017 0812   ALKPHOS 137 (H) 05/27/2017  5638   AST 44 (H) 05/27/2017 0812   ALT 34 05/27/2017 0812   BILITOT 0.3 05/27/2017 9373       RADIOGRAPHIC STUDIES: I have personally reviewed the radiological images as listed and agreed with the findings in the report. No results found.   ASSESSMENT & PLAN:  Metastatic melanoma to liver (HCC) Recurrent metastatic melanoma BRAF POSITIVE- multiple metastatic lesions to the liver; bilateral lung nodules; L1 vertebral compression fracture. JAN 23rd CT scan-shows progression of disease in the lungs; cutaneous nodules; also brain.   Currently on II line therapy with B-raf inhibitor + MEK inhibitor- Dabrafenib+ Tremetinib [started jan 26th 2019] significant partial clinical response noted-improvement of the subcutaneous nodules left inframammary/right breast nodule.    #Patient tolerated treatment without any major side effects.   # Brain metastases-status post SBR T-lesion smaller;MRI Jan 2019-however previously 2 mm lesion left parietal lobe increased to 9 mm edema; other 2 mm nodule noted on the right parietal area.  Currently asymptomatic.  Will repeat MRI   #Hyponatremia improved; sodium 137 today.   #Discussed with patient regarding getting a second opinion/possibility for clinical trials down the line when she will progress on the current therapy-discussed options of [facility].  She  will let us know  # Follow-up in approximately 3 weeks/labs; CT scan/ MRI brain.    Orders Placed This Encounter  Procedures  . CT CHEST W CONTRAST    Standing Status:   Future    Standing Expiration Date:   05/28/2018    Order Specific Question:   If indicated for the ordered procedure, I authorize the administration of contrast media per Radiology protocol    Answer:   Yes    Order Specific Question:   Preferred imaging location?    Answer:   [facility]    Order Specific Question:   Radiology Contrast Protocol - do NOT remove file path    Answer:   _0 [email or web address]    Order Specific Question:   Is patient pregnant?    Answer:   No  . CT Abdomen Pelvis W Contrast    Standing Status:   Future    Standing Expiration Date:   05/27/2018    Order Specific Question:   If indicated for the ordered procedure, I authorize the administration of contrast media per Radiology protocol    Answer:   Yes    Order Specific Question:   Is patient pregnant?    Answer:   No    Order Specific Question:   Preferred imaging location?    Answer:   [facility]    Order Specific Question:   Radiology Contrast Protocol - do NOT remove file path    Answer:   _1 [email or web address]  . MR Brain W Wo Contrast    Standing Status:   Future    Standing Expiration Date:   05/27/2018    Order Specific Question:   If indicated for the ordered procedure, I authorize the administration of contrast media per Radiology protocol    Answer:   Yes    Order Specific Question:   What is the patient's sedation requirement?    Answer:   No Sedation    Order Specific Question:   Does the patient have a pacemaker or implanted devices?    Answer:   No    Order Specific Question:   Radiology Contrast Protocol - do NOT remove file path    Answer:   _2 [email or web address]    Order Specific Question:  Preferred imaging location?    Answer:   [facility]  [place] (table limit-350lbs)    Order Specific Question:   Is patient pregnant?    Answer:   No  . Comprehensive metabolic panel    Standing Status:   Future    Standing Expiration Date:   05/28/2018  . CBC with Differential/Platelet    Standing Status:   Future    Standing Expiration Date:   05/28/2018  . Lactate dehydrogenase    Standing Status:   Future    Standing Expiration Date:   05/28/2018   All questions were answered. The patient knows to call the clinic with any problems, questions or concerns.      Cammie Sickle, MD 05/27/2017 9:45 AM

## 2017-06-12 MED FILL — TAFINLAR 75 MG CAPSULE: 75 | 30 days supply | Qty: 120 | Fill #2

## 2017-06-12 MED FILL — MEKINIST 2 MG TAB: 2 | 30 days supply | Qty: 30 | Fill #2

## 2017-06-13 ENCOUNTER — Encounter (INDEPENDENT_AMBULATORY_CARE_PROVIDER_SITE_OTHER): Payer: Self-pay

## 2017-06-13 ENCOUNTER — Ambulatory Visit
Admission: RE | Admit: 2017-06-13 | Discharge: 2017-06-13 | Disposition: A | Discharge status: Home / Self Care | Payer: BC Managed Care – PPO | Source: Ambulatory Visit | Attending: Internal Medicine | Admitting: Internal Medicine

## 2017-06-13 DIAGNOSIS — S32019D Unspecified fracture of first lumbar vertebra, subsequent encounter for fracture with routine healing: Secondary | ICD-10-CM | POA: Insufficient documentation

## 2017-06-13 DIAGNOSIS — I708 Atherosclerosis of other arteries: Secondary | ICD-10-CM | POA: Insufficient documentation

## 2017-06-13 DIAGNOSIS — C7931 Secondary malignant neoplasm of brain: Secondary | ICD-10-CM | POA: Insufficient documentation

## 2017-06-13 DIAGNOSIS — I700 Atherosclerosis of aorta: Secondary | ICD-10-CM | POA: Diagnosis not present

## 2017-06-13 DIAGNOSIS — R918 Other nonspecific abnormal finding of lung field: Secondary | ICD-10-CM | POA: Diagnosis not present

## 2017-06-13 DIAGNOSIS — X58XXXD Exposure to other specified factors, subsequent encounter: Secondary | ICD-10-CM | POA: Insufficient documentation

## 2017-06-13 DIAGNOSIS — C7900 Secondary malignant neoplasm of unspecified kidney and renal pelvis: Secondary | ICD-10-CM | POA: Diagnosis not present

## 2017-06-13 DIAGNOSIS — R609 Edema, unspecified: Secondary | ICD-10-CM | POA: Diagnosis not present

## 2017-06-13 DIAGNOSIS — N83202 Unspecified ovarian cyst, left side: Secondary | ICD-10-CM | POA: Insufficient documentation

## 2017-06-13 DIAGNOSIS — C787 Secondary malignant neoplasm of liver and intrahepatic bile duct: Secondary | ICD-10-CM | POA: Insufficient documentation

## 2017-06-13 MED ORDER — IOPAMIDOL (ISOVUE-300) INJECTION 61%
100.0000 mL | Freq: Once | INTRAVENOUS | Status: AC | PRN
  Administered 2017-06-13: 100 mL via INTRAVENOUS

## 2017-06-13 MED ORDER — GADOBENATE DIMEGLUMINE 529 MG/ML IV SOLN
10.0000 mL | Freq: Once | INTRAVENOUS | Status: AC | PRN
  Administered 2017-06-13: 10 mL via INTRAVENOUS

## 2017-06-17 ENCOUNTER — Encounter: Payer: Self-pay | Admitting: Internal Medicine

## 2017-06-17 ENCOUNTER — Telehealth: Payer: Self-pay | Admitting: Internal Medicine

## 2017-06-17 ENCOUNTER — Inpatient Hospital Stay: Payer: BC Managed Care – PPO

## 2017-06-17 ENCOUNTER — Inpatient Hospital Stay: Payer: BC Managed Care – PPO | Admitting: Internal Medicine

## 2017-06-17 VITALS — BP 130/85 | HR 84 | Temp 98.1°F | Resp 16 | Wt 130.4 lb

## 2017-06-17 DIAGNOSIS — C7951 Secondary malignant neoplasm of bone: Secondary | ICD-10-CM | POA: Diagnosis not present

## 2017-06-17 DIAGNOSIS — C439 Malignant melanoma of skin, unspecified: Secondary | ICD-10-CM | POA: Diagnosis not present

## 2017-06-17 DIAGNOSIS — C7800 Secondary malignant neoplasm of unspecified lung: Secondary | ICD-10-CM

## 2017-06-17 DIAGNOSIS — C787 Secondary malignant neoplasm of liver and intrahepatic bile duct: Secondary | ICD-10-CM

## 2017-06-17 DIAGNOSIS — C7931 Secondary malignant neoplasm of brain: Secondary | ICD-10-CM

## 2017-06-17 DIAGNOSIS — E039 Hypothyroidism, unspecified: Secondary | ICD-10-CM

## 2017-06-17 LAB — COMPREHENSIVE METABOLIC PANEL
ALBUMIN: 3.3 g/dL — AB (ref 3.5–5.0)
ALT: 29 U/L (ref 14–54)
ANION GAP: 8 (ref 5–15)
AST: 43 U/L — ABNORMAL HIGH (ref 15–41)
Alkaline Phosphatase: 138 U/L — ABNORMAL HIGH (ref 38–126)
BUN: 8 mg/dL (ref 6–20)
CHLORIDE: 103 mmol/L (ref 101–111)
CO2: 25 mmol/L (ref 22–32)
Calcium: 8.5 mg/dL — ABNORMAL LOW (ref 8.9–10.3)
Creatinine, Ser: 0.48 mg/dL (ref 0.44–1.00)
GFR calc non Af Amer: >60 mL/min (ref >60)
Glucose, Bld: 102 mg/dL — ABNORMAL HIGH (ref 65–99)
POTASSIUM: 3.8 mmol/L (ref 3.5–5.1)
SODIUM: 136 mmol/L (ref 135–145)
Total Bilirubin: 0.4 mg/dL (ref 0.3–1.2)
Total Protein: 6.7 g/dL (ref 6.5–8.1)

## 2017-06-17 LAB — CBC WITH DIFFERENTIAL/PLATELET
BASOS PCT: 1 %
Basophils Absolute: 0 10*3/uL (ref 0–0.1)
EOS ABS: 0.1 10*3/uL (ref 0–0.7)
EOS PCT: 3 %
HCT: 30.6 % — ABNORMAL LOW (ref 35.0–47.0)
HEMOGLOBIN: 10.6 g/dL — AB (ref 12.0–16.0)
LYMPHS ABS: 1.3 10*3/uL (ref 1.0–3.6)
Lymphocytes Relative: 36 %
MCH: 29.6 pg (ref 26.0–34.0)
MCHC: 34.5 g/dL (ref 32.0–36.0)
MCV: 85.8 fL (ref 80.0–100.0)
MONOS PCT: 11 %
Monocytes Absolute: 0.4 10*3/uL (ref 0.2–0.9)
NEUTROS PCT: 49 %
Neutro Abs: 1.7 10*3/uL (ref 1.4–6.5)
PLATELETS: 222 10*3/uL (ref 150–440)
RBC: 3.57 MIL/uL — ABNORMAL LOW (ref 3.80–5.20)
RDW: 14.7 % — ABNORMAL HIGH (ref 11.5–14.5)
WBC: 3.5 10*3/uL — ABNORMAL LOW (ref 3.6–11.0)

## 2017-06-17 LAB — LACTATE DEHYDROGENASE: LDH: 299 U/L — ABNORMAL HIGH (ref 98–192)

## 2017-06-17 MED ORDER — SODIUM CHLORIDE 0.9% FLUSH
10.0000 mL | INTRAVENOUS | Status: DC | PRN
  Administered 2017-06-17: 10 mL via INTRAVENOUS
  Filled 2017-06-17: qty 10

## 2017-06-17 MED ORDER — HEPARIN SOD (PORK) LOCK FLUSH 100 UNIT/ML IV SOLN
500.0000 [IU] | Freq: Once | INTRAVENOUS | Status: AC
  Administered 2017-06-17: 500 [IU] via INTRAVENOUS

## 2017-06-17 NOTE — Assessment & Plan Note (Addendum)
Recurrent metastatic melanoma BRAF POSITIVE- multiple metastatic lesions to the liver; bilateral lung nodules; L1 vertebral compression fracture.   # Currently on II line therapy with B-raf inhibitor + MEK inhibitor- Dabrafenib+ Tremetinib [started jan 26th 2019]; CT scan March 24 shows significant partial response-with significant improvement of the lung nodules; subcutaneous nodules; liver lesions; also MRI of the brain shows improved response.  No new lesions  #Patient tolerating current therapy extremely well without no side effects.  I would recommend continued current therapy; patient has new shipment available to her.  # Brain metastases-right frontal status post SBR T; MRI March 2019 -lesion smaller; other lesions have gotten smaller [suggestive of response to targeted therapy].  # Chills 35-45 mins-every 4 weeks; ? Sec to dabrafenib versus Mekinist. Recommend benadryl/ tyelenol  #I had a long discussion the patient that unfortunately with this combination therapy -the responses are not durable; and usually responses last for about 10-12 months.   # Discussed with patient regarding getting a second opinion/possibility for clinical trials down the line when she will progress on the current therapy as she has previously progressed on combination immunotherapy. She is interested in referral to [facility].  # Follow-up in approximately 3 weeks/labs.  Referral to [facility].  # I reviewed the blood work- with the patient in detail; also reviewed the imaging independently [as summarized above]; and with the patient in detail.   # 40 minutes face-to-face with the patient discussing the above plan of care; more than 50% of time spent on prognosis/ natural history; counseling and coordination.

## 2017-06-17 NOTE — Telephone Encounter (Signed)
Please make a referral to [place] clinic; diagnosis melanoma/clinical trial options.

## 2017-06-17 NOTE — Telephone Encounter (Signed)
Referral has been entered.

## 2017-06-17 NOTE — Progress Notes (Signed)
 [place] OFFICE PROGRESS NOTE  Patient Care Team: Dion Body, MD as PCP - General (Family Medicine)  Cancer Staging No matching staging information was found for the patient.   Oncology History   # NOV 2018-RECURRENT METASTATIC MELANOMA MULTIPLE LIVER LESIONS/lung lesions [s/p Bx on Nov 30th]; DEc 2018- PET-multiple lung liver bone metastasis.   # Dec 11th,2018- ipi+Nivo s/p 2 cycles;  # Jan 23rd 2019 CT scan- Progression; Jan 2019- MRI brain- improved SBRT lesion; worsening 9 mm left parietal; new 2 mm right parietal  #April 19, 2017- Dabrafenib+Mekinist; March 24th- significant PR.   # Dec 1st 2018- Brain mets-Right frontal 29m [s/p SBRT- dec 21st]; and 2 other 2-4 mm. March 2019- Improved   # Bone mets- X-geva [12/04]; plan RT   # Hx of Melanoma- dec 2011 [SLNBx- 0/4-Neg;[facility] ]; Hx of thyroid cancer- feb 2001- s/p left throid lobectomy [[place]]; NO RAIU.  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  female pleasant patient above history of  metastatic melanoma-brain and liver-currently on dabrafenib + Tremetinib [jan 26th] is here for follow-up.  Patient denies any fevers.  She has intermittent chills that lasted about 30 minutes-every 3-4 weeks or so. Symptoms resolved by themselves.  Complains of mild dry skin. Otherwise denies any skin rashes. Patient noted to have improvement of her left chest wall subcutaneous nodule/and also right breast nodule. Complains of dry skin.  Denies any headaches.  Denies any fevers chills vision changes.  Appetite is good.  Stable weight.  REVIEW OF SYSTEMS:  A complete 10 point review of system is done which is negative except mentioned above/history of present illness.   PAST MEDICAL HISTORY :  Past Medical History:  Diagnosis Date  . Hypothyroidism   . Melanoma of skin ([place] 2011   Resected from  Right neck area with 4 lymph nodes as well.   . Thyroid cancer ([place] 2001   Partial thyroidectomy    PAST SURGICAL HISTORY :   Past Surgical History:  Procedure Laterality Date  . BREAST BIOPSY Right 2005   neg  . BREAST BIOPSY Left 07/11/2016   radial scar  . BREAST EXCISIONAL BIOPSY Left 08/06/2016   lumpectomy for radial scar  . BREAST LUMPECTOMY WITH NEEDLE LOCALIZATION Left 08/06/2016   Procedure: BREAST LUMPECTOMY WITH NEEDLE LOCALIZATION;  Surgeon: SLeonie Green MD;  Location: [facility] ORS;  Service: General;  Laterality: Left;  .Marland KitchenMELANOMA EXCISION  2011   ear  . PORTA CATH INSERTION N/A 03/14/2017   Procedure: PORTA CATH INSERTION;  Surgeon: SKatha Cabal MD;  [facility];  Service: Cardiovascular;  Laterality: N/A;  . THYROIDECTOMY, PARTIAL Left     FAMILY HISTORY :   Family History  Problem Relation Age of Onset  . Breast cancer Mother 585 . Breast cancer Paternal Aunt   . Colon cancer Brother 521   SOCIAL HISTORY:   Social History   Tobacco Use  . Smoking status: Former Smoker    Packs/day: 0.25    Years: 20.00    Pack years: 5.00    Types: Cigarettes    Last attempt to quit: 03/26/1999    Years since quitting: 18.2  . Smokeless tobacco: Never Used  Substance Use Topics  . Alcohol use: Yes    Comment: WEEKENDS  . Drug use: No    ALLERGIES:  is allergic to tetanus-diphtheria toxoids td.  MEDICATIONS:  Current Outpatient Medications  Medication Sig Dispense Refill  . atorvastatin (LIPITOR) 40 MG tablet Take 40 mg by mouth every evening.    . dabrafenib mesylate (TAFINLAR) 75 MG capsule Take 2 capsules (150 mg total) by mouth 2 (two) times daily. Take on an empty stomach 1 hour before or 2 hours after meals. 120 capsule 4  . levothyroxine (SYNTHROID, LEVOTHROID) 100 MCG tablet Take 100 mcg by mouth daily before breakfast.    . lidocaine-prilocaine (EMLA) cream Apply 1 application topically as needed. 30 g 4  . traMADol  (ULTRAM) 50 MG tablet Take 1 tablet (50 mg total) by mouth every 6 (six) hours as needed. 60 tablet 0  . trametinib dimethyl sulfoxide (MEKINIST) 2 MG tablet Take 1 tablet (2 mg total) by mouth daily. Take 1 hour before or 2 hours after a meal. Store refrigerated in original container. 30 tablet 4   No current facility-administered medications for this visit.    Facility-Administered Medications Ordered in Other Visits  Medication Dose Route Frequency Provider Last Rate Last Dose  . sodium chloride flush (NS) 0.9 % injection 10 mL  10 mL Intravenous PRN Cammie Sickle, MD   10 mL at 06/17/17 1002    PHYSICAL EXAMINATION: ECOG PERFORMANCE STATUS: 0 - Asymptomatic  BP 130/85 (BP Location: Left Arm, Patient Position: Sitting)   Pulse 84   Temp 98.1 F (36.7 C) (Tympanic)   Resp 16   Wt 130 lb 5.7 oz (59.1 kg)   LMP 02/29/2012 (Approximate)   BMI 22.38 kg/m   Filed Weights   06/17/17 1015  Weight: 130 lb 5.7 oz (59.1 kg)    GENERAL: Well-nourished well-developed; Alert, no distress and comfortable.   With her husband. Marland Kitchen EYES: no pallor or icterus OROPHARYNX: no thrush or ulceration; good dentition  NECK: supple, no neck mass on the right side improved. LYMPH:  no palpable lymphadenopathy in the cervical, axillary or inguinal regions LUNGS: clear to auscultation and  No wheeze or crackles HEART/CVS: regular rate & rhythm and no murmurs; No lower extremity edema ABDOMEN:abdomen soft, non-tender and normal bowel sounds Musculoskeletal:no cyanosis of digits and no clubbing  PSYCH: alert & oriented x 3 with fluent speech NEURO: no focal motor/sensory deficits SKIN: 1x1 cm subcutaneous nodule noted in the right breast-lower inner quadrant; 1x1 cm subcutaneous lesion noted-right inframammary [improved]   Mild macular skin rash noted bilateral lower extremities.  LABORATORY DATA:  I have reviewed the data as listed    Component Value Date/Time   NA 136 06/17/2017 1006   K  3.8 06/17/2017 1006   CL 103 06/17/2017 1006   CO2 25 06/17/2017 1006   GLUCOSE 102 (H) 06/17/2017 1006   BUN 8 06/17/2017 1006   CREATININE 0.48 06/17/2017 1006   CALCIUM 8.5 (L) 06/17/2017 1006   PROT 6.7 06/17/2017 1006   ALBUMIN 3.3 (L) 06/17/2017 1006   AST 43 (H) 06/17/2017 1006   ALT 29 06/17/2017 1006   ALKPHOS 138 (H) 06/17/2017 1006   BILITOT 0.4 06/17/2017 1006   GFRNONAA >60 06/17/2017 1006   GFRAA >60 06/17/2017 1006    No results found for: SPEP, UPEP  Lab Results  Component Value Date   WBC 3.5 (L) 06/17/2017   NEUTROABS 1.7 06/17/2017   HGB 10.6 (L) 06/17/2017   HCT 30.6 (L) 06/17/2017   MCV 85.8 06/17/2017   PLT 222 06/17/2017      Chemistry  Component Value Date/Time   NA 136 003/05/202019 1006   K 3.8 003/04/202019 1006   CL 103 003/11/202019 1006   CO2 25 06/17/2017 1006   BUN 8 06/17/2017 1006   CREATININE 0.48 06/17/2017 1006      Component Value Date/Time   CALCIUM 8.5 (L) 06/17/2017 1006   ALKPHOS 138 (H) 06/17/2017 1006   AST 43 (H) 06/17/2017 1006   ALT 29 06/17/2017 1006   BILITOT 0.4 06/17/2017 1006       RADIOGRAPHIC STUDIES: I have personally reviewed the radiological images as listed and agreed with the findings in the report. No results found.   ASSESSMENT & PLAN:  Metastatic melanoma to liver (HCC) Recurrent metastatic melanoma BRAF POSITIVE- multiple metastatic lesions to the liver; bilateral lung nodules; L1 vertebral compression fracture.   # Currently on II line therapy with B-raf inhibitor + MEK inhibitor- Dabrafenib+ Tremetinib [started jan 26th 2019]; CT scan March 24 shows significant partial response-with significant improvement of the lung nodules; subcutaneous nodules; liver lesions; also MRI of the brain shows improved response.  No new lesions  #Patient tolerating current therapy extremely well without no side effects.  I would recommend continued current therapy; patient has new shipment available to her.  # Brain  metastases-right frontal status post SBR T; MRI March 2019 -lesion smaller; other lesions have gotten smaller [suggestive of response to targeted therapy].  # Chills 35-45 mins-every 4 weeks; ? Sec to dabrafenib versus Mekinist. Recommend benadryl/ tyelenol  #I had a long discussion the patient that unfortunately with this combination therapy -the responses are not durable; and usually responses last for about 10-12 months.   # Discussed with patient regarding getting a second opinion/possibility for clinical trials down the line when she will progress on the current therapy as she has previously progressed on combination immunotherapy. She is interested in referral to [facility].  # Follow-up in approximately 3 weeks/labs.  Referral to [facility].  # I reviewed the blood work- with the patient in detail; also reviewed the imaging independently [as summarized above]; and with the patient in detail.   # 40 minutes face-to-face with the patient discussing the above plan of care; more than 50% of time spent on prognosis/ natural history; counseling and coordination.     Orders Placed This Encounter  Procedures  . CBC with Differential    Standing Status:   Future    Standing Expiration Date:     . Comprehensive metabolic panel    Standing Status:   Future    Standing Expiration Date:     . Lactate dehydrogenase    Standing Status:   Future    Standing Expiration Date:      All questions were answered. The patient knows to call the clinic with any problems, questions or concerns.      Cammie Sickle, MD 06/17/2017 12:05 PM

## 2017-06-17 NOTE — Addendum Note (Signed)
Addended by: Sandria Bales B on: 06/17/2017 04:46 PM   Modules accepted: Orders

## 2017-07-08 ENCOUNTER — Inpatient Hospital Stay: Payer: BC Managed Care – PPO

## 2017-07-08 ENCOUNTER — Encounter (INDEPENDENT_AMBULATORY_CARE_PROVIDER_SITE_OTHER): Payer: Self-pay

## 2017-07-08 ENCOUNTER — Other Ambulatory Visit: Payer: Self-pay

## 2017-07-08 ENCOUNTER — Inpatient Hospital Stay: Payer: BC Managed Care – PPO | Attending: Internal Medicine | Admitting: Internal Medicine

## 2017-07-08 ENCOUNTER — Encounter: Payer: Self-pay | Admitting: Internal Medicine

## 2017-07-08 VITALS — BP 127/88 | HR 94 | Temp 97.9°F | Resp 20 | Ht 64.0 in | Wt 135.6 lb

## 2017-07-08 DIAGNOSIS — C7951 Secondary malignant neoplasm of bone: Secondary | ICD-10-CM | POA: Insufficient documentation

## 2017-07-08 DIAGNOSIS — C787 Secondary malignant neoplasm of liver and intrahepatic bile duct: Secondary | ICD-10-CM

## 2017-07-08 DIAGNOSIS — C439 Malignant melanoma of skin, unspecified: Secondary | ICD-10-CM

## 2017-07-08 DIAGNOSIS — C7931 Secondary malignant neoplasm of brain: Secondary | ICD-10-CM | POA: Diagnosis not present

## 2017-07-08 DIAGNOSIS — E890 Postprocedural hypothyroidism: Secondary | ICD-10-CM | POA: Diagnosis not present

## 2017-07-08 DIAGNOSIS — Z9089 Acquired absence of other organs: Secondary | ICD-10-CM | POA: Diagnosis not present

## 2017-07-08 DIAGNOSIS — C7800 Secondary malignant neoplasm of unspecified lung: Secondary | ICD-10-CM | POA: Diagnosis not present

## 2017-07-08 LAB — CBC WITH DIFFERENTIAL/PLATELET
Basophils Absolute: 0 10*3/uL (ref 0–0.1)
Basophils Relative: 1 %
Eosinophils Absolute: 0 10*3/uL (ref 0–0.7)
Eosinophils Relative: 1 %
HEMATOCRIT: 34.6 % — AB (ref 35.0–47.0)
HEMOGLOBIN: 11.7 g/dL — AB (ref 12.0–16.0)
Lymphocytes Relative: 36 %
Lymphs Abs: 1.2 10*3/uL (ref 1.0–3.6)
MCH: 29.3 pg (ref 26.0–34.0)
MCHC: 33.8 g/dL (ref 32.0–36.0)
MCV: 86.7 fL (ref 80.0–100.0)
MONO ABS: 0.5 10*3/uL (ref 0.2–0.9)
MONOS PCT: 14 %
NEUTROS ABS: 1.6 10*3/uL (ref 1.4–6.5)
NEUTROS PCT: 48 %
Platelets: 169 10*3/uL (ref 150–440)
RBC: 3.99 MIL/uL (ref 3.80–5.20)
RDW: 14.6 % — ABNORMAL HIGH (ref 11.5–14.5)
WBC: 3.3 10*3/uL — ABNORMAL LOW (ref 3.6–11.0)

## 2017-07-08 LAB — COMPREHENSIVE METABOLIC PANEL
ALBUMIN: 3.4 g/dL — AB (ref 3.5–5.0)
ALK PHOS: 102 U/L (ref 38–126)
ALT: 23 U/L (ref 14–54)
AST: 31 U/L (ref 15–41)
Anion gap: 7 (ref 5–15)
BUN: 12 mg/dL (ref 6–20)
CALCIUM: 8.7 mg/dL — AB (ref 8.9–10.3)
CO2: 25 mmol/L (ref 22–32)
CREATININE: 0.56 mg/dL (ref 0.44–1.00)
Chloride: 102 mmol/L (ref 101–111)
GFR calc non Af Amer: >60 mL/min (ref >60)
Glucose, Bld: 101 mg/dL — ABNORMAL HIGH (ref 65–99)
Potassium: 3.9 mmol/L (ref 3.5–5.1)
SODIUM: 134 mmol/L — AB (ref 135–145)
Total Bilirubin: 0.3 mg/dL (ref 0.3–1.2)
Total Protein: 6.8 g/dL (ref 6.5–8.1)

## 2017-07-08 LAB — LACTATE DEHYDROGENASE: LDH: 181 U/L (ref 98–192)

## 2017-07-08 LAB — TSH: TSH: 0.018 u[IU]/mL — ABNORMAL LOW (ref 0.350–4.500)

## 2017-07-08 NOTE — Assessment & Plan Note (Addendum)
Recurrent metastatic melanoma BRAF POSITIVE- multiple metastatic lesions to the liver; bilateral lung nodules; L1 vertebral compression fracture.   # Currently on II line therapy with B-raf inhibitor + MEK inhibitor- Dabrafenib+ Tremetinib [started jan 26th 2019]; CT scan March 24 shows significant partial response-with significant improvement of the lung nodules; subcutaneous nodules; liver lesions; also MRI of the brain shows improved response.  No new lesions  #Patient tolerating current therapy extremely well without no side effects.  I would recommend continued current therapy. # Discussed with patient regarding getting a second opinion/possibility for clinical trials down the line when she will progress on the current therapy as she has previously progressed on combination immunotherapy. She is interested in referral to [facility].  #Bone metastases-on Xgeva; recommend restarting at next visit.  # Brain metastases-right frontal status post SBRT; MRI March 2019 -lesion smaller; other lesions have gotten smaller [suggestive of response to targeted therapy].  #History of hypothyroidism-on Synthroid check TSH today.  #Would hold off from driving-given history of brain metastasis.  # Follow-up in approximately 3 weeks/labs/Xgeva.  Referral to [facility]. Add TSH to labs today.   Cc; Dr.L

## 2017-07-08 NOTE — Progress Notes (Signed)
[place] OFFICE PROGRESS NOTE  Patient Care Team: Dion Body, MD as PCP - General (Family Medicine)  Cancer Staging No matching staging information was found for the patient.   Oncology History   # NOV 2018-RECURRENT METASTATIC MELANOMA MULTIPLE LIVER LESIONS/lung lesions [s/p Bx on Nov 30th]; DEc 2018- PET-multiple lung liver bone metastasis.   # Dec 11th,2018- ipi+Nivo s/p 2 cycles;  # Jan 23rd 2019 CT scan- Progression; Jan 2019- MRI brain- improved SBRT lesion; worsening 9 mm left parietal; new 2 mm right parietal  #April 19, 2017- Dabrafenib+Mekinist; March 24th- significant PR.   # Dec 1st 2018- Brain mets-Right frontal 44m [s/p SBRT- dec 21st]; and 2 other 2-4 mm. March 2019- Improved   # Bone mets- X-geva [12/04]; plan RT   # Hx of Melanoma- dec 2011 [SLNBx- 0/4-Neg;[facility] ]; Hx of thyroid cancer- feb 2001- s/p left throid lobectomy [[place]]; NO RAIU.  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  female pleasant patient above history of  metastatic melanoma-brain and liver-currently on dabrafenib + Tremetinib [jan 26th] is here for follow-up.  Patient admits to good appetite.  No nausea no vomiting.  No fevers.  No chills.   Denies any new lumps or bumps.  Her weight is stable.  No headaches.  No weakness in legs.  REVIEW OF SYSTEMS:  A complete 10 point review of system is done which is negative except mentioned above/history of present illness.   PAST MEDICAL HISTORY :  Past Medical History:  Diagnosis Date  . Hypothyroidism   . Melanoma of skin ([place] 2011   Resected from Right neck area with 4 lymph nodes as well.   . Thyroid cancer ([place] 2001   Partial thyroidectomy    PAST SURGICAL HISTORY :   Past Surgical History:  Procedure Laterality Date  . BREAST BIOPSY Right 2005   neg  . BREAST BIOPSY Left 07/11/2016    radial scar  . BREAST EXCISIONAL BIOPSY Left 08/06/2016   lumpectomy for radial scar  . BREAST LUMPECTOMY WITH NEEDLE LOCALIZATION Left 08/06/2016   Procedure: BREAST LUMPECTOMY WITH NEEDLE LOCALIZATION;  Surgeon: SLeonie Green MD;  Location: [facility] ORS;  Service: General;  Laterality: Left;  .Marland KitchenMELANOMA EXCISION  2011   ear  . PORTA CATH INSERTION N/A 03/14/2017   Procedure: PORTA CATH INSERTION;  Surgeon: SKatha Cabal MD;  [facility];  Service: Cardiovascular;  Laterality: N/A;  . THYROIDECTOMY, PARTIAL Left     FAMILY HISTORY :   Family History  Problem Relation Age of Onset  . Breast cancer Mother 54 . Breast cancer Paternal Aunt   . Colon cancer Brother 557   SOCIAL HISTORY:   Social History   Tobacco Use  . Smoking status: Former Smoker    Packs/day: 0.25    Years: 20.00    Pack years: 5.00    Types: Cigarettes    Last attempt to quit: 03/26/1999    Years since quitting: 18.2  . Smokeless tobacco: Never Used  Substance Use Topics  . Alcohol use: Yes    Comment: WEEKENDS  . Drug use: No    ALLERGIES:  is allergic to tetanus-diphtheria toxoids td.  MEDICATIONS:  Current Outpatient Medications  Medication Sig Dispense Refill  . atorvastatin (LIPITOR) 40 MG tablet Take 40 mg by mouth every evening.    .Marland Kitchen  dabrafenib mesylate (TAFINLAR) 75 MG capsule Take 2 capsules (150 mg total) by mouth 2 (two) times daily. Take on an empty stomach 1 hour before or 2 hours after meals. 120 capsule 4  . levothyroxine (SYNTHROID, LEVOTHROID) 100 MCG tablet Take 100 mcg by mouth daily before breakfast.    . lidocaine-prilocaine (EMLA) cream Apply 1 application topically as needed. 30 g 4  . traMADol (ULTRAM) 50 MG tablet Take 1 tablet (50 mg total) by mouth every 6 (six) hours as needed. 60 tablet 0  . trametinib dimethyl sulfoxide (MEKINIST) 2 MG tablet Take 1 tablet (2 mg total) by mouth daily. Take 1 hour before or 2 hours after a meal. Store  refrigerated in original container. 30 tablet 4   No current facility-administered medications for this visit.     PHYSICAL EXAMINATION: ECOG PERFORMANCE STATUS: 0 - Asymptomatic  BP 127/88 (BP Location: Left Arm, Patient Position: Sitting)   Pulse 94   Temp 97.9 F (36.6 C) (Tympanic)   Resp 20   Ht '5\' 4"'  (1.626 m)   Wt 135 lb 9.3 oz (61.5 kg)   LMP 02/29/2012 (Approximate)   BMI 23.27 kg/m   Filed Weights   07/08/17 0950  Weight: 135 lb 9.3 oz (61.5 kg)    GENERAL: Well-nourished well-developed; Alert, no distress and comfortable.   With her husband. Marland Kitchen EYES: no pallor or icterus OROPHARYNX: no thrush or ulceration; good dentition  NECK: supple, no neck mass on the right side improved. LYMPH:  no palpable lymphadenopathy in the cervical, axillary or inguinal regions LUNGS: clear to auscultation and  No wheeze or crackles HEART/CVS: regular rate & rhythm and no murmurs; No lower extremity edema ABDOMEN:abdomen soft, non-tender and normal bowel sounds Musculoskeletal:no cyanosis of digits and no clubbing  PSYCH: alert & oriented x 3 with fluent speech NEURO: no focal motor/sensory deficits SKIN: 1x1 cm subcutaneous nodule noted in the right breast-lower inner quadrant; 1x1 cm subcutaneous lesion noted-right inframammary [improved]   Mild macular skin rash noted bilateral lower extremities.  LABORATORY DATA:  I have reviewed the data as listed    Component Value Date/Time   NA 134 (L) 07/08/2017 0933   K 3.9 07/08/2017 0933   CL 102 07/08/2017 0933   CO2 25 07/08/2017 0933   GLUCOSE 101 (H) 07/08/2017 0933   BUN 12 07/08/2017 0933   CREATININE 0.56 07/08/2017 0933   CALCIUM 8.7 (L) 07/08/2017 0933   PROT 6.8 07/08/2017 0933   ALBUMIN 3.4 (L) 07/08/2017 0933   AST 31 07/08/2017 0933   ALT 23 07/08/2017 0933   ALKPHOS 102 07/08/2017 0933   BILITOT 0.3 07/08/2017 0933   GFRNONAA >60 07/08/2017 0933   GFRAA >60 07/08/2017 0933    No results found for: SPEP,  UPEP  Lab Results  Component Value Date   WBC 3.3 (L) 07/08/2017   NEUTROABS 1.6 07/08/2017   HGB 11.7 (L) 07/08/2017   HCT 34.6 (L) 07/08/2017   MCV 86.7 07/08/2017   PLT 169 07/08/2017      Chemistry      Component Value Date/Time   NA 134 (L) 07/08/2017 0933   K 3.9 07/08/2017 0933   CL 102 07/08/2017 0933   CO2 25 07/08/2017 0933   BUN 12 07/08/2017 0933   CREATININE 0.56 07/08/2017 0933      Component Value Date/Time   CALCIUM 8.7 (L) 07/08/2017 0933   ALKPHOS 102 07/08/2017 0933   AST 31 07/08/2017 0933   ALT 23 07/08/2017 0933  BILITOT 0.3 07/08/2017 0933       RADIOGRAPHIC STUDIES: I have personally reviewed the radiological images as listed and agreed with the findings in the report. No results found.   ASSESSMENT & PLAN:  Metastatic melanoma to liver (HCC) Recurrent metastatic melanoma BRAF POSITIVE- multiple metastatic lesions to the liver; bilateral lung nodules; L1 vertebral compression fracture.   # Currently on II line therapy with B-raf inhibitor + MEK inhibitor- Dabrafenib+ Tremetinib [started jan 26th 2019]; CT scan March 24 shows significant partial response-with significant improvement of the lung nodules; subcutaneous nodules; liver lesions; also MRI of the brain shows improved response.  No new lesions  #Patient tolerating current therapy extremely well without no side effects.  I would recommend continued current therapy. # Discussed with patient regarding getting a second opinion/possibility for clinical trials down the line when she will progress on the current therapy as she has previously progressed on combination immunotherapy. She is interested in referral to [facility].  #Bone metastases-on Xgeva; recommend restarting at next visit.  # Brain metastases-right frontal status post SBRT; MRI March 2019 -lesion smaller; other lesions have gotten smaller [suggestive of response to targeted therapy].  #History of hypothyroidism-on Synthroid check TSH  today.  #Would hold off from driving-given history of brain metastasis.  # Follow-up in approximately 3 weeks/labs/Xgeva.  Referral to [facility]. Add TSH to labs today.   Cc; Dr.L     Orders Placed This Encounter  Procedures  . TSH    Standing Status:   Future    Number of Occurrences:   1    Standing Expiration Date:   07/09/2018   All questions were answered. The patient knows to call the clinic with any problems, questions or concerns.      Tanya Sickle, MD 07/08/2017 10:30 AM

## 2017-07-10 MED FILL — TAFINLAR 75 MG CAPSULE: 75 | 30 days supply | Qty: 120 | Fill #3

## 2017-07-10 MED FILL — MEKINIST 2 MG TAB: 2 | 30 days supply | Qty: 30 | Fill #3

## 2017-07-29 ENCOUNTER — Inpatient Hospital Stay: Payer: BC Managed Care – PPO

## 2017-07-29 ENCOUNTER — Other Ambulatory Visit: Payer: Self-pay

## 2017-07-29 ENCOUNTER — Inpatient Hospital Stay: Payer: BC Managed Care – PPO | Attending: Internal Medicine | Admitting: Internal Medicine

## 2017-07-29 ENCOUNTER — Encounter: Payer: Self-pay | Admitting: Internal Medicine

## 2017-07-29 VITALS — BP 144/85 | HR 84 | Temp 97.7°F | Resp 18 | Wt 129.4 lb

## 2017-07-29 DIAGNOSIS — Z85850 Personal history of malignant neoplasm of thyroid: Secondary | ICD-10-CM | POA: Insufficient documentation

## 2017-07-29 DIAGNOSIS — Z87891 Personal history of nicotine dependence: Secondary | ICD-10-CM | POA: Insufficient documentation

## 2017-07-29 DIAGNOSIS — Z79899 Other long term (current) drug therapy: Secondary | ICD-10-CM | POA: Diagnosis not present

## 2017-07-29 DIAGNOSIS — C7800 Secondary malignant neoplasm of unspecified lung: Secondary | ICD-10-CM | POA: Diagnosis not present

## 2017-07-29 DIAGNOSIS — C439 Malignant melanoma of skin, unspecified: Secondary | ICD-10-CM | POA: Insufficient documentation

## 2017-07-29 DIAGNOSIS — C7951 Secondary malignant neoplasm of bone: Secondary | ICD-10-CM | POA: Diagnosis not present

## 2017-07-29 DIAGNOSIS — M549 Dorsalgia, unspecified: Secondary | ICD-10-CM | POA: Diagnosis not present

## 2017-07-29 DIAGNOSIS — C787 Secondary malignant neoplasm of liver and intrahepatic bile duct: Secondary | ICD-10-CM | POA: Insufficient documentation

## 2017-07-29 DIAGNOSIS — E039 Hypothyroidism, unspecified: Secondary | ICD-10-CM | POA: Insufficient documentation

## 2017-07-29 DIAGNOSIS — C7931 Secondary malignant neoplasm of brain: Secondary | ICD-10-CM | POA: Diagnosis not present

## 2017-07-29 LAB — COMPREHENSIVE METABOLIC PANEL
ALBUMIN: 3.3 g/dL — AB (ref 3.5–5.0)
ALT: 31 U/L (ref 14–54)
AST: 37 U/L (ref 15–41)
Alkaline Phosphatase: 133 U/L — ABNORMAL HIGH (ref 38–126)
Anion gap: 9 (ref 5–15)
BILIRUBIN TOTAL: 0.4 mg/dL (ref 0.3–1.2)
BUN: 8 mg/dL (ref 6–20)
CHLORIDE: 106 mmol/L (ref 101–111)
CO2: 26 mmol/L (ref 22–32)
CREATININE: 0.57 mg/dL (ref 0.44–1.00)
Calcium: 9.1 mg/dL (ref 8.9–10.3)
GFR calc Af Amer: >60 mL/min (ref >60)
GLUCOSE: 99 mg/dL (ref 65–99)
Potassium: 3.8 mmol/L (ref 3.5–5.1)
Sodium: 141 mmol/L (ref 135–145)
TOTAL PROTEIN: 6.6 g/dL (ref 6.5–8.1)

## 2017-07-29 LAB — LACTATE DEHYDROGENASE: LDH: 293 U/L — AB (ref 98–192)

## 2017-07-29 LAB — TSH: TSH: 0.127 u[IU]/mL — ABNORMAL LOW (ref 0.350–4.500)

## 2017-07-29 LAB — CBC WITH DIFFERENTIAL/PLATELET
BASOS ABS: 0 10*3/uL (ref 0–0.1)
Basophils Relative: 1 %
EOS ABS: 0.1 10*3/uL (ref 0–0.7)
EOS PCT: 2 %
HEMATOCRIT: 31.7 % — AB (ref 35.0–47.0)
Hemoglobin: 10.9 g/dL — ABNORMAL LOW (ref 12.0–16.0)
LYMPHS ABS: 1.2 10*3/uL (ref 1.0–3.6)
LYMPHS PCT: 33 %
MCH: 29.4 pg (ref 26.0–34.0)
MCHC: 34.5 g/dL (ref 32.0–36.0)
MCV: 85.3 fL (ref 80.0–100.0)
MONO ABS: 0.5 10*3/uL (ref 0.2–0.9)
Monocytes Relative: 13 %
Neutro Abs: 1.8 10*3/uL (ref 1.4–6.5)
Neutrophils Relative %: 51 %
PLATELETS: 252 10*3/uL (ref 150–440)
RBC: 3.71 MIL/uL — ABNORMAL LOW (ref 3.80–5.20)
RDW: 14.4 % (ref 11.5–14.5)
WBC: 3.6 10*3/uL (ref 3.6–11.0)

## 2017-07-29 MED ORDER — SODIUM CHLORIDE 0.9% FLUSH
10.0000 mL | INTRAVENOUS | Status: DC | PRN
  Administered 2017-07-29: 10 mL via INTRAVENOUS
  Filled 2017-07-29: qty 10

## 2017-07-29 MED ORDER — HEPARIN SOD (PORK) LOCK FLUSH 100 UNIT/ML IV SOLN
500.0000 [IU] | Freq: Once | INTRAVENOUS | Status: AC
  Administered 2017-07-29: 500 [IU] via INTRAVENOUS

## 2017-07-29 MED ORDER — DENOSUMAB 120 MG/1.7ML ~~LOC~~ SOLN
120.0000 mg | Freq: Once | SUBCUTANEOUS | Status: AC
  Administered 2017-07-29: 120 mg via SUBCUTANEOUS

## 2017-07-29 NOTE — Progress Notes (Signed)
[place] OFFICE PROGRESS NOTE  Patient Care Team: Dion Body, MD as PCP - General (Family Medicine)  Cancer Staging No matching staging information was found for the patient.   Oncology History   # NOV 2018-RECURRENT METASTATIC MELANOMA MULTIPLE LIVER LESIONS/lung lesions [s/p Bx on Nov 30th]; DEc 2018- PET-multiple lung liver bone metastasis.   # Dec 11th,2018- ipi+Nivo s/p 2 cycles;  # Jan 23rd 2019 CT scan- Progression; Jan 2019- MRI brain- improved SBRT lesion; worsening 9 mm left parietal; new 2 mm right parietal  #April 19, 2017- Dabrafenib+Mekinist; March 24th- significant PR.   # Dec 1st 2018- Brain mets-Right frontal 65m [s/p SBRT- dec 21st]; and 2 other 2-4 mm. March 2019- Improved   # Bone mets- X-geva [12/04]; plan RT   # Hx of Melanoma- dec 2011 [SLNBx- 0/4-Neg;[facility] ]; Hx of thyroid cancer- feb 2001- s/p left throid lobectomy [[place]]; NO RAIU.  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  female pleasant patient above history of b-raf mutated currently on second line therapy with dab+tremi-is here for follow-up.  Patient denies any new lumps or bumps.  Appetite is good.  Lost about 4 pounds since last visit.  Otherwise denies any chest pain or shortness of breath or cough.  Denies any back pain.  No tingling or numbness.  No headaches.  Review of Systems  Constitutional: Negative for chills, diaphoresis, fever, malaise/fatigue and weight loss.  HENT: Negative for nosebleeds and sore throat.   Eyes: Negative for double vision.  Respiratory: Negative for cough, hemoptysis, sputum production, shortness of breath and wheezing.   Cardiovascular: Negative for chest pain, palpitations, orthopnea and leg swelling.  Gastrointestinal: Negative for abdominal pain, blood in stool, constipation, diarrhea, heartburn, melena, nausea  and vomiting.  Genitourinary: Negative for dysuria, frequency and urgency.  Musculoskeletal: Negative for back pain and joint pain.  Skin: Negative.  Negative for itching and rash.  Neurological: Negative for dizziness, tingling, focal weakness, weakness and headaches.  Endo/Heme/Allergies: Does not bruise/bleed easily.  Psychiatric/Behavioral: Negative for depression. The patient is not nervous/anxious and does not have insomnia.       PAST MEDICAL HISTORY :  Past Medical History:  Diagnosis Date  . Hypothyroidism   . Melanoma of skin ([place] 2011   Resected from Right neck area with 4 lymph nodes as well.   . Thyroid cancer ([place] 2001   Partial thyroidectomy    PAST SURGICAL HISTORY :   Past Surgical History:  Procedure Laterality Date  . BREAST BIOPSY Right 2005   neg  . BREAST BIOPSY Left 07/11/2016   radial scar  . BREAST EXCISIONAL BIOPSY Left 08/06/2016   lumpectomy for radial scar  . BREAST LUMPECTOMY WITH NEEDLE LOCALIZATION Left 08/06/2016   Procedure: BREAST LUMPECTOMY WITH NEEDLE LOCALIZATION;  Surgeon: SLeonie Green MD;  Location: [facility] ORS;  Service: General;  Laterality: Left;  .Marland KitchenMELANOMA EXCISION  2011   ear  . PORTA CATH INSERTION N/A 03/14/2017   Procedure: PORTA CATH INSERTION;  Surgeon: SKatha Cabal MD;  [facility];  Service: Cardiovascular;  Laterality: N/A;  . THYROIDECTOMY, PARTIAL Left     FAMILY HISTORY :   Family History  Problem Relation Age of Onset  . Breast cancer Mother 539 . Breast cancer Paternal Aunt   . Colon cancer Brother 558  SOCIAL HISTORY:   Social History   Tobacco Use  . Smoking status: Former Smoker    Packs/day: 0.25    Years: 20.00    Pack years: 5.00    Types: Cigarettes    Last attempt to quit: 03/26/1999    Years since quitting: 18.3  . Smokeless tobacco: Never Used  Substance Use Topics  . Alcohol use: Yes    Comment: WEEKENDS  . Drug use: No    ALLERGIES:  is allergic to  tetanus-diphtheria toxoids td.  MEDICATIONS:  Current Outpatient Medications  Medication Sig Dispense Refill  . atorvastatin (LIPITOR) 40 MG tablet Take 40 mg by mouth every evening.    . dabrafenib mesylate (TAFINLAR) 75 MG capsule Take 2 capsules (150 mg total) by mouth 2 (two) times daily. Take on an empty stomach 1 hour before or 2 hours after meals. 120 capsule 4  . levothyroxine (SYNTHROID, LEVOTHROID) 100 MCG tablet Take 100 mcg by mouth daily before breakfast.    . lidocaine-prilocaine (EMLA) cream Apply 1 application topically as needed. 30 g 4  . trametinib dimethyl sulfoxide (MEKINIST) 2 MG tablet Take 1 tablet (2 mg total) by mouth daily. Take 1 hour before or 2 hours after a meal. Store refrigerated in original container. 30 tablet 4  . traMADol (ULTRAM) 50 MG tablet Take 1 tablet (50 mg total) by mouth every 6 (six) hours as needed. (Patient not taking: Reported on 07/29/2017) 60 tablet 0   No current facility-administered medications for this visit.    Facility-Administered Medications Ordered in Other Visits  Medication Dose Route Frequency Provider Last Rate Last Dose  . sodium chloride flush (NS) 0.9 % injection 10 mL  10 mL Intravenous PRN Cammie Sickle, MD   10 mL at 07/29/17 1339    PHYSICAL EXAMINATION: ECOG PERFORMANCE STATUS: 0 - Asymptomatic  BP (!) 144/85 (BP Location: Left Arm, Patient Position: Sitting)   Pulse 84   Temp 97.7 F (36.5 C) (Tympanic)   Resp 18   Wt 129 lb 6.6 oz (58.7 kg)   LMP 02/29/2012 (Approximate)   BMI 22.21 kg/m   Filed Weights   07/29/17 1339  Weight: 129 lb 6.6 oz (58.7 kg)    GENERAL: Well-nourished well-developed; Alert, no distress and comfortable.  Accompanied by family.  EYES: no pallor or icterus OROPHARYNX: no thrush or ulceration; NECK: supple; no lymph nodes felt. LYMPH:  no palpable lymphadenopathy in the axillary or inguinal regions LUNGS: Decreased breath sounds auscultation bilaterally. No wheeze or  crackles HEART/CVS: regular rate & rhythm and no murmurs; No lower extremity edema ABDOMEN:abdomen soft, non-tender and normal bowel sounds. No hepatomegaly or splenomegaly.  Musculoskeletal:no cyanosis of digits and no clubbing  PSYCH: alert & oriented x 3 with fluent speech NEURO: no focal motor/sensory deficits SKIN:  no rashes or significant lesions    LABORATORY DATA:  I have reviewed the data as listed    Component Value Date/Time   NA 141 07/29/2017 1328   K 3.8 07/29/2017 1328   CL 106 07/29/2017 1328   CO2 26 07/29/2017 1328   GLUCOSE 99 07/29/2017 1328   BUN 8 07/29/2017 1328   CREATININE 0.57 07/29/2017 1328   CALCIUM 9.1 07/29/2017 1328   PROT 6.6 07/29/2017 1328   ALBUMIN 3.3 (L) 07/29/2017 1328   AST 37 07/29/2017 1328   ALT 31 07/29/2017 1328   ALKPHOS 133 (H) 07/29/2017 1328   BILITOT 0.4 07/29/2017 1328   GFRNONAA >60 07/29/2017 1328   GFRAA >60  07/29/2017 1328    No results found for: SPEP, UPEP  Lab Results  Component Value Date   WBC 3.6 07/29/2017   NEUTROABS 1.8 07/29/2017   HGB 10.9 (L) 07/29/2017   HCT 31.7 (L) 07/29/2017   MCV 85.3 07/29/2017   PLT 252 07/29/2017      Chemistry      Component Value Date/Time   NA 141 07/29/2017 1328   K 3.8 07/29/2017 1328   CL 106 07/29/2017 1328   CO2 26 07/29/2017 1328   BUN 8 07/29/2017 1328   CREATININE 0.57 07/29/2017 1328      Component Value Date/Time   CALCIUM 9.1 07/29/2017 1328   ALKPHOS 133 (H) 07/29/2017 1328   AST 37 07/29/2017 1328   ALT 31 07/29/2017 1328   BILITOT 0.4 07/29/2017 1328       RADIOGRAPHIC STUDIES: I have personally reviewed the radiological images as listed and agreed with the findings in the report. No results found.   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   #Patient currently on second line therapy with- Dabrafenib+  Tremetinib [started jan 26th 2019]; CT scan March 24 shows significant partial response-with significant improvement of the lung nodules; subcutaneous nodules; liver lesions; also MRI of the brain shows improved response.  Awaiting second opinion/evaluation at [facility]-option of clinical trials if patient progresses on current therapy.  #Patient tolerating treatment extremely well.  No concerns of clinical progression.  Continue current therapy.  Will need a refill on her prescriptions.  #Brain metastasis clinically no progression.  # Back pain/vertebral fracture-significant improvement of pain.  Continue Xgeva.  # Hypothyroidism: On 100 mcg of Synthroid.  Recent TSH 0.01.  Recheck TSH today.   # follow up in 1 month/labs/Xgeva.  Will order CT scan at that visit.    Cc; Dr.L     Orders Placed This Encounter  Procedures  . CBC with Differential/Platelet    Standing Status:   Future    Standing Expiration Date:   07/30/2018  . Comprehensive metabolic panel    Standing Status:   Future    Standing Expiration Date:   07/30/2018  . Lactate dehydrogenase    Standing Status:   Future    Standing Expiration Date:   07/30/2018  . Thyroid Panel With TSH    Standing Status:   Future    Standing Expiration Date:   07/30/2018  . TSH    Standing Status:   Future    Number of Occurrences:   1    Standing Expiration Date:   07/30/2018   All questions were answered. The patient knows to call the clinic with any problems, questions or concerns.      Cammie Sickle, MD 07/29/2017 3:53 PM

## 2017-07-29 NOTE — Assessment & Plan Note (Signed)
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   #Patient currently on second line therapy with- Dabrafenib+ Tremetinib [started jan 26th 2019]; CT scan March 24 shows significant partial response-with significant improvement of the lung nodules; subcutaneous nodules; liver lesions; also MRI of the brain shows improved response.  Awaiting second opinion/evaluation at [facility]-option of clinical trials if patient progresses on current therapy.  #Patient tolerating treatment extremely well.  No concerns of clinical progression.  Continue current therapy.  Will need a refill on her prescriptions.  #Brain metastasis clinically no progression.  # Back pain/vertebral fracture-significant improvement of pain.  Continue Xgeva.  # Hypothyroidism: On 100 mcg of Synthroid.  Recent TSH 0.01.  Recheck TSH today.   # follow up in 1 month/labs/Xgeva.  Will order CT scan at that visit.    Cc; Dr.L

## 2017-07-29 NOTE — Patient Instructions (Signed)

## 2017-07-29 NOTE — Progress Notes (Signed)
Here for follow up. Per pt feeling less foggy and more energy

## 2017-08-07 ENCOUNTER — Telehealth: Payer: Self-pay | Admitting: Internal Medicine

## 2017-08-07 MED ORDER — LEVOTHYROXINE SODIUM 75 MCG PO TABS: 75.0000 ug | ORAL_TABLET | Freq: Every day | ORAL | 6 refills | Status: DC

## 2017-08-07 NOTE — Telephone Encounter (Signed)
Patient notified of Dr. Brahmanday's recommendations and verbalized understanding.  

## 2017-08-07 NOTE — Telephone Encounter (Signed)
Please inform patient that her thyroid numbers continue to be slightly abnormal.  I would recommend decreasing the dose of Synthroid to 75 mcg; will send a new prescription to her pharmacy.  We will continue to monitor her thyroid numbers closely.  Thx.

## 2017-08-12 MED FILL — MEKINIST 2 MG TAB: 2 | 30 days supply | Qty: 30 | Fill #4

## 2017-08-12 MED FILL — TAFINLAR 75 MG CAPSULE: 75 | 30 days supply | Qty: 120 | Fill #4

## 2017-08-26 ENCOUNTER — Encounter: Payer: Self-pay | Admitting: Internal Medicine

## 2017-08-26 ENCOUNTER — Other Ambulatory Visit: Payer: Self-pay

## 2017-08-26 ENCOUNTER — Inpatient Hospital Stay: Payer: BC Managed Care – PPO

## 2017-08-26 ENCOUNTER — Ambulatory Visit: Payer: BC Managed Care – PPO

## 2017-08-26 ENCOUNTER — Inpatient Hospital Stay: Payer: BC Managed Care – PPO | Attending: Internal Medicine | Admitting: Internal Medicine

## 2017-08-26 DIAGNOSIS — E039 Hypothyroidism, unspecified: Secondary | ICD-10-CM | POA: Diagnosis not present

## 2017-08-26 DIAGNOSIS — C7951 Secondary malignant neoplasm of bone: Secondary | ICD-10-CM

## 2017-08-26 DIAGNOSIS — C7800 Secondary malignant neoplasm of unspecified lung: Secondary | ICD-10-CM | POA: Diagnosis not present

## 2017-08-26 DIAGNOSIS — Z79899 Other long term (current) drug therapy: Secondary | ICD-10-CM | POA: Insufficient documentation

## 2017-08-26 DIAGNOSIS — C787 Secondary malignant neoplasm of liver and intrahepatic bile duct: Secondary | ICD-10-CM

## 2017-08-26 DIAGNOSIS — Z85850 Personal history of malignant neoplasm of thyroid: Secondary | ICD-10-CM

## 2017-08-26 DIAGNOSIS — Z87891 Personal history of nicotine dependence: Secondary | ICD-10-CM | POA: Diagnosis not present

## 2017-08-26 DIAGNOSIS — C439 Malignant melanoma of skin, unspecified: Secondary | ICD-10-CM

## 2017-08-26 DIAGNOSIS — C7931 Secondary malignant neoplasm of brain: Secondary | ICD-10-CM | POA: Diagnosis not present

## 2017-08-26 LAB — CBC WITH DIFFERENTIAL/PLATELET
Basophils Absolute: 0 10*3/uL (ref 0–0.1)
Basophils Relative: 1 %
EOS PCT: 1 %
Eosinophils Absolute: 0 10*3/uL (ref 0–0.7)
HCT: 32.8 % — ABNORMAL LOW (ref 35.0–47.0)
HEMOGLOBIN: 11.3 g/dL — AB (ref 12.0–16.0)
LYMPHS ABS: 0.6 10*3/uL — AB (ref 1.0–3.6)
LYMPHS PCT: 27 %
MCH: 29.3 pg (ref 26.0–34.0)
MCHC: 34.5 g/dL (ref 32.0–36.0)
MCV: 84.7 fL (ref 80.0–100.0)
Monocytes Absolute: 0.3 10*3/uL (ref 0.2–0.9)
Monocytes Relative: 13 %
NEUTROS ABS: 1.4 10*3/uL (ref 1.4–6.5)
Neutrophils Relative %: 58 %
Platelets: 178 10*3/uL (ref 150–440)
RBC: 3.88 MIL/uL (ref 3.80–5.20)
RDW: 14.5 % (ref 11.5–14.5)
WBC: 2.4 10*3/uL — AB (ref 3.6–11.0)

## 2017-08-26 LAB — COMPREHENSIVE METABOLIC PANEL
ALBUMIN: 3.4 g/dL — AB (ref 3.5–5.0)
ALT: 32 U/L (ref 14–54)
AST: 45 U/L — AB (ref 15–41)
Alkaline Phosphatase: 126 U/L (ref 38–126)
Anion gap: 12 (ref 5–15)
BUN: 11 mg/dL (ref 6–20)
CHLORIDE: 98 mmol/L — AB (ref 101–111)
CO2: 24 mmol/L (ref 22–32)
Calcium: 8.6 mg/dL — ABNORMAL LOW (ref 8.9–10.3)
Creatinine, Ser: 0.76 mg/dL (ref 0.44–1.00)
GFR calc Af Amer: >60 mL/min (ref >60)
Glucose, Bld: 98 mg/dL (ref 65–99)
POTASSIUM: 3.4 mmol/L — AB (ref 3.5–5.1)
Sodium: 134 mmol/L — ABNORMAL LOW (ref 135–145)
Total Bilirubin: 0.4 mg/dL (ref 0.3–1.2)
Total Protein: 6.7 g/dL (ref 6.5–8.1)

## 2017-08-26 LAB — LACTATE DEHYDROGENASE: LDH: 383 U/L — ABNORMAL HIGH (ref 98–192)

## 2017-08-26 MED ORDER — DABRAFENIB MESYLATE 75 MG PO CAPS: 150.0000 mg | ORAL_CAPSULE | Freq: Two times a day (BID) | ORAL | 4 refills | Status: DC

## 2017-08-26 MED ORDER — DENOSUMAB 120 MG/1.7ML ~~LOC~~ SOLN
120.0000 mg | Freq: Once | SUBCUTANEOUS | Status: AC
  Administered 2017-08-26: 120 mg via SUBCUTANEOUS

## 2017-08-26 MED ORDER — TRAMETINIB DIMETHYL SULFOXIDE 2 MG PO TABS: 2.0000 mg | ORAL_TABLET | Freq: Every day | ORAL | 4 refills | Status: DC

## 2017-08-26 NOTE — Assessment & Plan Note (Addendum)
#  Melanoma-BRAF mutant; metastatic-currently on dabrafenib plus trametinib [jan 26th2019]-March 2019-improved response; currently stable.  #Continue current therapy; tolerating extremely well. Will need a refill on her prescriptions.  Will get CT scan prior to next visit  #Brain metastasis clinically no progression.  Will order MRI at next visit  # Back pain/vertebral fracture-significant improvement of pain.  Continue Xgeva.  # Hypothyroidism: On 75 mcg a day.  TSH 0.68.  Continue current dose.  # follow up in 1 month/labs/Xgeva. CT prior. will order MRi at that visit.

## 2017-08-26 NOTE — Progress Notes (Signed)
[place] OFFICE PROGRESS NOTE  Patient Care Team: Dion Body, MD as PCP - General (Family Medicine)  Cancer Staging No matching staging information was found for the patient.   Oncology History   # NOV 2018-RECURRENT METASTATIC MELANOMA MULTIPLE LIVER LESIONS/lung lesions [s/p Bx on Nov 30th]; DEc 2018- PET-multiple lung liver bone metastasis.   # Dec 11th,2018- ipi+Nivo s/p 2 cycles;  # Jan 23rd 2019 CT scan- Progression; Jan 2019- MRI brain- improved SBRT lesion; worsening 9 mm left parietal; new 2 mm right parietal  #April 19, 2017- Dabrafenib+Mekinist; March 24th- significant PR.   # Dec 1st 2018- Brain mets-Right frontal 66m [s/p SBRT- dec 21st]; and 2 other 2-4 mm. March 2019- Improved   # Bone mets- X-geva [12/04]; plan RT   # Hx of Melanoma- dec 2011 [SLNBx- 0/4-Neg;[facility] ]; Hx of thyroid cancer- feb 2001- s/p left throid lobectomy [[place]]; NO RAIU.  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  female pleasant patient above history of metastatic melanoma currently on dab+Mek is here for follow-up.  [facility]  Patient's appetite is good.  Denies any headaches.  No nausea no vomiting.  Review of Systems  Constitutional: Negative for chills, diaphoresis, fever, malaise/fatigue and weight loss.  HENT: Negative for nosebleeds and sore throat.   Eyes: Negative for double vision.  Respiratory: Negative for cough, hemoptysis, sputum production, shortness of breath and wheezing.   Cardiovascular: Negative for chest pain, palpitations, orthopnea and leg swelling.  Gastrointestinal: Negative for  abdominal pain, blood in stool, constipation, diarrhea, heartburn, melena, nausea and vomiting.  Genitourinary: Negative for dysuria, frequency and urgency.  Musculoskeletal: Negative for back pain and joint pain.  Skin: Negative.  Negative for itching and rash.  Neurological: Negative for dizziness, tingling, focal weakness, weakness and headaches.  Endo/Heme/Allergies: Does not bruise/bleed easily.  Psychiatric/Behavioral: Negative for depression. The patient is not nervous/anxious and does not have insomnia.       PAST MEDICAL HISTORY :  Past Medical History:  Diagnosis Date  . Hypothyroidism   . Melanoma of skin ([facility] 2011   Resected from Right neck area with 4 lymph nodes as well.   . Thyroid cancer ([facility] 2001   Partial thyroidectomy    PAST SURGICAL HISTORY :   Past Surgical History:  Procedure Laterality Date  . BREAST BIOPSY Right 2005   neg  . BREAST BIOPSY Left 07/11/2016   radial scar  . BREAST EXCISIONAL BIOPSY Left 08/06/2016   lumpectomy for radial scar  . BREAST LUMPECTOMY WITH NEEDLE LOCALIZATION Left 08/06/2016   Procedure: BREAST LUMPECTOMY WITH NEEDLE LOCALIZATION;  Surgeon: SLeonie Green MD;  Location: [facility] ORS;  Service: General;  Laterality: Left;  .Marland KitchenMELANOMA EXCISION  2011   ear  . PORTA CATH INSERTION N/A 03/14/2017   Procedure: PORTA CATH INSERTION;  Surgeon: SKatha Cabal MD;  [facility];  Service: Cardiovascular;  Laterality: N/A;  . THYROIDECTOMY, PARTIAL Left     FAMILY HISTORY :   Family History  Problem Relation Age of Onset  . Breast cancer Mother 554 . Breast cancer Paternal Aunt   . Colon cancer Brother  40    SOCIAL HISTORY:   Social History   Tobacco Use  . Smoking status: Former Smoker    Packs/day: 0.25    Years: 20.00    Pack years: 5.00    Types: Cigarettes    Last attempt to quit: 03/26/1999    Years since quitting: 18.4  . Smokeless tobacco: Never Used  Substance Use Topics  . Alcohol  use: Yes    Comment: WEEKENDS  . Drug use: No    ALLERGIES:  is allergic to tetanus-diphtheria toxoids td.  MEDICATIONS:  Current Outpatient Medications  Medication Sig Dispense Refill  . atorvastatin (LIPITOR) 40 MG tablet Take 40 mg by mouth every evening.    . dabrafenib mesylate (TAFINLAR) 75 MG capsule Take 2 capsules (150 mg total) by mouth 2 (two) times daily. Take on an empty stomach 1 hour before or 2 hours after meals. 120 capsule 4  . levothyroxine (SYNTHROID) 75 MCG tablet Take 1 tablet (75 mcg total) by mouth daily before breakfast. 30 tablet 6  . lidocaine-prilocaine (EMLA) cream Apply 1 application topically as needed. 30 g 4  . trametinib dimethyl sulfoxide (MEKINIST) 2 MG tablet Take 1 tablet (2 mg total) by mouth daily. Take 1 hour before or 2 hours after a meal. Store refrigerated in original container. 30 tablet 4  . traMADol (ULTRAM) 50 MG tablet Take 1 tablet (50 mg total) by mouth every 6 (six) hours as needed. (Patient not taking: Reported on 07/29/2017) 60 tablet 0   No current facility-administered medications for this visit.     PHYSICAL EXAMINATION: ECOG PERFORMANCE STATUS: 0 - Asymptomatic  BP 112/76 (BP Location: Left Arm, Patient Position: Sitting)   Pulse 96   Temp 97.8 F (36.6 C) (Tympanic)   Resp 20   Ht '5\' 4"'  (1.626 m)   Wt 127 lb 13.9 oz (58 kg)   LMP 02/29/2012 (Approximate)   BMI 21.95 kg/m   Filed Weights   08/26/17 0936  Weight: 127 lb 13.9 oz (58 kg)    GENERAL: Well-nourished well-developed; Alert, no distress and comfortable.  Accompanied by family. EYES: no pallor or icterus OROPHARYNX: no thrush or ulceration; NECK: supple; no lymph nodes felt. LYMPH:  no palpable lymphadenopathy in the axillary or inguinal regions LUNGS: Decreased breath sounds auscultation bilaterally. No wheeze or crackles HEART/CVS: regular rate & rhythm and no murmurs; No lower extremity edema ABDOMEN:abdomen soft, non-tender and normal bowel sounds. No  hepatomegaly or splenomegaly.  Musculoskeletal:no cyanosis of digits and no clubbing  PSYCH: alert & oriented x 3 with fluent speech NEURO: no focal motor/sensory deficits SKIN:  no rashes or significant lesions    LABORATORY DATA:  I have reviewed the data as listed    Component Value Date/Time   NA 134 (L) 08/26/2017 0905   K 3.4 (L) 08/26/2017 0905   CL 98 (L) 08/26/2017 0905   CO2 24 08/26/2017 0905   GLUCOSE 98 08/26/2017 0905   BUN 11 08/26/2017 0905   CREATININE 0.76 08/26/2017 0905   CALCIUM 8.6 (L) 08/26/2017 0905   PROT 6.7 08/26/2017 0905   ALBUMIN 3.4 (L) 08/26/2017 0905   AST 45 (H) 08/26/2017 0905   ALT 32 08/26/2017 0905   ALKPHOS 126 08/26/2017 0905   BILITOT 0.4 08/26/2017 0905   GFRNONAA >60 08/26/2017 0905   GFRAA >60 08/26/2017 0905    No results found for: SPEP, UPEP  Lab Results  Component Value Date   WBC 2.4 (L) 08/26/2017   NEUTROABS 1.4 08/26/2017  HGB 11.3 (L) 08/26/2017   HCT 32.8 (L) 08/26/2017   MCV 84.7 08/26/2017   PLT 178 08/26/2017      Chemistry      Component Value Date/Time   NA 134 (L) 08/26/2017 0905   K 3.4 (L) 08/26/2017 0905   CL 98 (L) 08/26/2017 0905   CO2 24 08/26/2017 0905   BUN 11 08/26/2017 0905   CREATININE 0.76 08/26/2017 0905      Component Value Date/Time   CALCIUM 8.6 (L) 08/26/2017 0905   ALKPHOS 126 08/26/2017 0905   AST 45 (H) 08/26/2017 0905   ALT 32 08/26/2017 0905   BILITOT 0.4 08/26/2017 0905       RADIOGRAPHIC STUDIES: I have personally reviewed the radiological images as listed and agreed with the findings in the report. No results found.   ASSESSMENT & PLAN:  Metastatic melanoma to liver ([place]) #Melanoma-BRAF mutant; metastatic-currently on dabrafenib plus trametinib [jan 26th2019]-March 2019-improved response; currently stable.  #Continue current therapy; tolerating extremely well. Will need a refill on her prescriptions.  Will get CT scan prior to next visit  #Brain metastasis  clinically no progression.  Will order MRI at next visit  # Back pain/vertebral fracture-significant improvement of pain.  Continue Xgeva.  # Hypothyroidism: On 75 mcg a day.  TSH 0.68.  Continue current dose.  # follow up in 1 month/labs/Xgeva. CT prior. will order MRi at that visit.       Orders Placed This Encounter  Procedures  . CT CHEST W CONTRAST    Standing Status:   Future    Standing Expiration Date:   08/27/2018    Order Specific Question:   If indicated for the ordered procedure, I authorize the administration of contrast media per Radiology protocol    Answer:   Yes    Order Specific Question:   Preferred imaging location?    Answer:   [facility]    Order Specific Question:   Radiology Contrast Protocol - do NOT remove file path    Answer:   [email or web address]    Order Specific Question:   Is patient pregnant?    Answer:   No  . CT Abdomen Pelvis W Contrast    Standing Status:   Future    Standing Expiration Date:   08/26/2018    Order Specific Question:   If indicated for the ordered procedure, I authorize the administration of contrast media per Radiology protocol    Answer:   Yes    Order Specific Question:   Is patient pregnant?    Answer:   No    Order Specific Question:   Preferred imaging location?    Answer:   [facility]    Order Specific Question:   Is Oral Contrast requested for this exam?    Answer:   Yes, Per Radiology protocol    Order Specific Question:   Radiology Contrast Protocol - do NOT remove file path    Answer:   [email or web address]  . CBC with Differential    Standing Status:   Future    Standing Expiration Date:   08/27/2018  . Comprehensive metabolic panel    Standing Status:   Future    Standing Expiration Date:   08/27/2018  . Lactate dehydrogenase    Standing Status:   Future    Standing Expiration Date:   08/27/2018   All questions were answered. The patient knows to call the  clinic with any problems, questions or concerns.  Cammie Sickle, MD 08/27/2017 10:59 PM

## 2017-08-27 LAB — THYROID PANEL WITH TSH
Free Thyroxine Index: 2 (ref 1.2–4.9)
T3 UPTAKE RATIO: 26 % (ref 24–39)
T4, Total: 7.6 ug/dL (ref 4.5–12.0)
TSH: 0.68 u[IU]/mL (ref 0.450–4.500)

## 2017-09-10 MED FILL — MEKINIST 2 MG TAB: 2 | 30 days supply | Qty: 30 | Fill #0

## 2017-09-10 MED FILL — TAFINLAR 75 MG CAPSULE: 75 | 30 days supply | Qty: 120 | Fill #0

## 2017-09-19 ENCOUNTER — Ambulatory Visit
Admission: RE | Admit: 2017-09-19 | Discharge: 2017-09-19 | Disposition: A | Discharge status: Home / Self Care | Payer: BC Managed Care – PPO | Source: Ambulatory Visit | Attending: Internal Medicine | Admitting: Internal Medicine

## 2017-09-19 ENCOUNTER — Inpatient Hospital Stay: Payer: BC Managed Care – PPO

## 2017-09-19 DIAGNOSIS — R911 Solitary pulmonary nodule: Secondary | ICD-10-CM | POA: Insufficient documentation

## 2017-09-19 DIAGNOSIS — K769 Liver disease, unspecified: Secondary | ICD-10-CM | POA: Diagnosis not present

## 2017-09-19 DIAGNOSIS — C787 Secondary malignant neoplasm of liver and intrahepatic bile duct: Secondary | ICD-10-CM

## 2017-09-19 DIAGNOSIS — C7951 Secondary malignant neoplasm of bone: Secondary | ICD-10-CM | POA: Insufficient documentation

## 2017-09-19 DIAGNOSIS — C439 Malignant melanoma of skin, unspecified: Secondary | ICD-10-CM | POA: Diagnosis not present

## 2017-09-19 LAB — CBC WITH DIFFERENTIAL/PLATELET
BASOS PCT: 0 %
Basophils Absolute: 0 10*3/uL (ref 0–0.1)
EOS PCT: 2 %
Eosinophils Absolute: 0.1 10*3/uL (ref 0–0.7)
HCT: 31.2 % — ABNORMAL LOW (ref 35.0–47.0)
Hemoglobin: 10.9 g/dL — ABNORMAL LOW (ref 12.0–16.0)
Lymphocytes Relative: 24 %
Lymphs Abs: 1.1 10*3/uL (ref 1.0–3.6)
MCH: 29.7 pg (ref 26.0–34.0)
MCHC: 35 g/dL (ref 32.0–36.0)
MCV: 84.8 fL (ref 80.0–100.0)
MONO ABS: 0.4 10*3/uL (ref 0.2–0.9)
Monocytes Relative: 9 %
Neutro Abs: 2.9 10*3/uL (ref 1.4–6.5)
Neutrophils Relative %: 65 %
PLATELETS: 189 10*3/uL (ref 150–440)
RBC: 3.68 MIL/uL — ABNORMAL LOW (ref 3.80–5.20)
RDW: 15.3 % — AB (ref 11.5–14.5)
WBC: 4.6 10*3/uL (ref 3.6–11.0)

## 2017-09-19 LAB — COMPREHENSIVE METABOLIC PANEL
ALBUMIN: 3.7 g/dL (ref 3.5–5.0)
ALK PHOS: 131 U/L — AB (ref 38–126)
ALT: 34 U/L (ref 0–44)
AST: 45 U/L — AB (ref 15–41)
Anion gap: 13 (ref 5–15)
BILIRUBIN TOTAL: 0.5 mg/dL (ref 0.3–1.2)
BUN: 11 mg/dL (ref 6–20)
CALCIUM: 8.8 mg/dL — AB (ref 8.9–10.3)
CO2: 23 mmol/L (ref 22–32)
Chloride: 99 mmol/L (ref 98–111)
Creatinine, Ser: 0.6 mg/dL (ref 0.44–1.00)
GFR calc Af Amer: >60 mL/min (ref >60)
GFR calc non Af Amer: >60 mL/min (ref >60)
GLUCOSE: 99 mg/dL (ref 70–99)
Potassium: 3.7 mmol/L (ref 3.5–5.1)
Sodium: 135 mmol/L (ref 135–145)
TOTAL PROTEIN: 6.9 g/dL (ref 6.5–8.1)

## 2017-09-19 LAB — LACTATE DEHYDROGENASE: LDH: 287 U/L — ABNORMAL HIGH (ref 98–192)

## 2017-09-19 MED ORDER — IOPAMIDOL (ISOVUE-300) INJECTION 61%
100.0000 mL | Freq: Once | INTRAVENOUS | Status: AC | PRN
  Administered 2017-09-19: 100 mL via INTRAVENOUS

## 2017-09-19 MED ORDER — HEPARIN SOD (PORK) LOCK FLUSH 100 UNIT/ML IV SOLN
500.0000 [IU] | Freq: Once | INTRAVENOUS | Status: AC
  Administered 2017-09-19: 500 [IU] via INTRAVENOUS

## 2017-09-19 MED ORDER — SODIUM CHLORIDE 0.9% FLUSH
10.0000 mL | INTRAVENOUS | Status: DC | PRN
  Administered 2017-09-19: 10 mL via INTRAVENOUS
  Filled 2017-09-19: qty 10

## 2017-09-23 ENCOUNTER — Inpatient Hospital Stay: Payer: BC Managed Care – PPO

## 2017-09-23 ENCOUNTER — Inpatient Hospital Stay: Payer: BC Managed Care – PPO | Attending: Internal Medicine

## 2017-09-23 ENCOUNTER — Encounter: Payer: Self-pay | Admitting: Internal Medicine

## 2017-09-23 ENCOUNTER — Inpatient Hospital Stay: Payer: BC Managed Care – PPO | Admitting: Internal Medicine

## 2017-09-23 ENCOUNTER — Inpatient Hospital Stay (HOSPITAL_BASED_OUTPATIENT_CLINIC_OR_DEPARTMENT_OTHER): Payer: BC Managed Care – PPO | Admitting: Internal Medicine

## 2017-09-23 ENCOUNTER — Other Ambulatory Visit: Payer: Self-pay

## 2017-09-23 VITALS — BP 134/76 | HR 100 | Temp 98.6°F | Resp 20 | Ht 64.0 in | Wt 127.9 lb

## 2017-09-23 DIAGNOSIS — C787 Secondary malignant neoplasm of liver and intrahepatic bile duct: Secondary | ICD-10-CM | POA: Diagnosis not present

## 2017-09-23 DIAGNOSIS — C439 Malignant melanoma of skin, unspecified: Secondary | ICD-10-CM | POA: Insufficient documentation

## 2017-09-23 DIAGNOSIS — E039 Hypothyroidism, unspecified: Secondary | ICD-10-CM | POA: Diagnosis not present

## 2017-09-23 DIAGNOSIS — Z452 Encounter for adjustment and management of vascular access device: Secondary | ICD-10-CM | POA: Insufficient documentation

## 2017-09-23 DIAGNOSIS — C7800 Secondary malignant neoplasm of unspecified lung: Secondary | ICD-10-CM

## 2017-09-23 DIAGNOSIS — C7951 Secondary malignant neoplasm of bone: Secondary | ICD-10-CM

## 2017-09-23 DIAGNOSIS — C7931 Secondary malignant neoplasm of brain: Secondary | ICD-10-CM

## 2017-09-23 DIAGNOSIS — Z87891 Personal history of nicotine dependence: Secondary | ICD-10-CM

## 2017-09-23 MED ORDER — DENOSUMAB 120 MG/1.7ML ~~LOC~~ SOLN
120.0000 mg | Freq: Once | SUBCUTANEOUS | Status: AC
  Administered 2017-09-23: 120 mg via SUBCUTANEOUS

## 2017-09-23 MED ORDER — DIPHENHYDRAMINE HCL 50 MG/ML IJ SOLN: 50.0000 mg | Freq: Once | INTRAMUSCULAR | Status: DC | PRN

## 2017-09-23 MED ORDER — METHYLPREDNISOLONE SODIUM SUCC 125 MG IJ SOLR: 125.0000 mg | Freq: Once | INTRAMUSCULAR | Status: DC | PRN

## 2017-09-23 MED ORDER — DIPHENHYDRAMINE HCL 50 MG/ML IJ SOLN
25.0000 mg | Freq: Once | INTRAMUSCULAR | Status: DC | PRN
Start: 2017-09-23 — End: 2017-09-23

## 2017-09-23 MED ORDER — ALBUTEROL SULFATE (2.5 MG/3ML) 0.083% IN NEBU
2.5000 mg | INHALATION_SOLUTION | Freq: Once | RESPIRATORY_TRACT | Status: DC | PRN
  Filled 2017-09-23: qty 3

## 2017-09-23 MED ORDER — EPINEPHRINE HCL 0.1 MG/ML IJ SOLN: 0.2500 mg | Freq: Once | INTRAMUSCULAR | Status: DC | PRN

## 2017-09-23 MED ORDER — SODIUM CHLORIDE 0.9 % IV SOLN
Freq: Once | INTRAVENOUS | Status: DC | PRN
  Filled 2017-09-23: qty 1000

## 2017-09-23 NOTE — Assessment & Plan Note (Addendum)
#  Melanoma-BRAF mutant; metastatic-currently on dabrafenib plus trametinib [jan 26th2019]; September 19, 2017-CT chest and pelvis shows-continued/improved response; except slightly increased size of the left upper lobe medial nodule approximately 12 to 14 mm in size [see discussion below]  #Left upper lobe medial posterior lung nodule increased in size-would recommend short-term CT scan in approximately 8 weeks; and if it continues to grow recommend SBRT.   #Otherwise continue current therapy tolerating extremely well  #Brain metastasis clinically no progression; stable.  MRI of the brain ordered today.  # Back pain/vertebral fracture-significant improvement of pain. STABLE. Continue Xgeva-calcium slightly low at 8.6 recommend continue calcium vitamin D.  # Hypothyroidism: On 75 mcg a day.  Recent TSH in June 2019 normal; stable.    # follow up in 1 month/labs/Xgeva.MRI prior.   # I reviewed the blood work- with the patient in detail; also reviewed the imaging independently [as summarized above]; and with the patient in detail.

## 2017-09-23 NOTE — Progress Notes (Signed)
[place] OFFICE PROGRESS NOTE  Patient Care Team: Dion Body, MD as PCP - General (Family Medicine)  Cancer Staging No matching staging information was found for the patient.   Oncology History   # NOV 2018-RECURRENT METASTATIC MELANOMA MULTIPLE LIVER LESIONS/lung lesions [s/p Bx on Nov 30th]; DEc 2018- PET-multiple lung liver bone metastasis.   # Dec 11th,2018- ipi+Nivo s/p 2 cycles;  # Jan 23rd 2019 CT scan- Progression; Jan 2019- MRI brain- improved SBRT lesion; worsening 9 mm left parietal; new 2 mm right parietal  #April 19, 2017- Dabrafenib+Mekinist; March 24th- significant PR.   # Dec 1st 2018- Brain mets-Right frontal 48m [s/p SBRT- dec 21st]; and 2 other 2-4 mm. March 2019- Improved   # Bone mets- X-geva [12/04]; plan RT   # Hx of Melanoma- dec 2011 [SLNBx- 0/4-Neg;[facility] ]; Hx of thyroid cancer- feb 2001- s/p left throid lobectomy [[place]]; NO RAIU.  # Molecular testing: Foundation: **BRAF V600E mutation; TMB-H; MSS; ------------------------------------------------    DIAGNOSIS: MELANOMA- B-raf pos  STAGE:   IV   ;GOALS: palliative  CURRENT/MOST RECENT THERAPY- dab+mek        Metastatic melanoma to liver ([facility]      INTERVAL HISTORY:  CKassidy Dockendorf89y.o.  female pleasant patient above history of metastatic melanoma BRAF positive currently on dabrafenib plus Mekinist is here for follow-up/review the results of the CT scan.  Denies any headaches.  Denies any nausea vomiting.  No fevers or chills.  No diarrhea.  No skin rash.  Review of Systems  Constitutional: Negative for chills, diaphoresis, fever, malaise/fatigue and weight loss.  HENT: Negative for nosebleeds and sore throat.   Eyes: Negative for double vision.  Respiratory: Negative for cough, hemoptysis, sputum production, shortness of breath and wheezing.   Cardiovascular: Negative for chest pain, palpitations, orthopnea and leg swelling.  Gastrointestinal: Negative for  abdominal pain, blood in stool, constipation, diarrhea, heartburn, melena, nausea and vomiting.  Genitourinary: Negative for dysuria, frequency and urgency.  Musculoskeletal: Negative for back pain and joint pain.  Skin: Negative.  Negative for itching and rash.  Neurological: Negative for dizziness, tingling, focal weakness, weakness and headaches.  Endo/Heme/Allergies: Does not bruise/bleed easily.  Psychiatric/Behavioral: Negative for depression. The patient is not nervous/anxious and does not have insomnia.       PAST MEDICAL HISTORY :  Past Medical History:  Diagnosis Date  . Hypothyroidism   . [place]   Resected from Right neck area with 4 lymph nodes as well.   . Thyroid cancer ([place] 2001   Partial thyroidectomy    PAST SURGICAL HISTORY :   Past Surgical History:  Procedure Laterality Date  . BREAST BIOPSY Right 2005   neg  . BREAST BIOPSY Left 07/11/2016   radial scar  . BREAST EXCISIONAL BIOPSY Left 08/06/2016   lumpectomy for radial scar  . BREAST LUMPECTOMY WITH NEEDLE LOCALIZATION Left 08/06/2016   Procedure: BREAST LUMPECTOMY WITH NEEDLE LOCALIZATION;  Surgeon: SLeonie Green MD;  Location: [facility] ORS;  Service: General;  Laterality: Left;  .Marland KitchenMELANOMA EXCISION  2011   ear  . PORTA CATH INSERTION N/A 03/14/2017   Procedure: PORTA CATH INSERTION;  Surgeon: SKatha Cabal MD;  [facility];  Service: Cardiovascular;  Laterality: N/A;  . THYROIDECTOMY, PARTIAL Left     FAMILY HISTORY :   Family History  Problem Relation Age of Onset  . Breast cancer Mother 540 . Breast cancer Paternal Aunt   .  Colon cancer Brother 63    SOCIAL HISTORY:   Social History   Tobacco Use  . Smoking status: Former Smoker    Packs/day: 0.25    Years: 20.00    Pack years: 5.00    Types: Cigarettes    Last attempt to quit: 03/26/1999    Years since quitting: 18.5  . Smokeless tobacco: Never Used  Substance Use Topics  . Alcohol  use: Yes    Comment: WEEKENDS  . Drug use: No    ALLERGIES:  is allergic to tetanus-diphtheria toxoids td.  MEDICATIONS:  Current Outpatient Medications  Medication Sig Dispense Refill  . atorvastatin (LIPITOR) 40 MG tablet Take 40 mg by mouth every evening.    . dabrafenib mesylate (TAFINLAR) 75 MG capsule Take 2 capsules (150 mg total) by mouth 2 (two) times daily. Take on an empty stomach 1 hour before or 2 hours after meals. 120 capsule 4  . levothyroxine (SYNTHROID) 75 MCG tablet Take 1 tablet (75 mcg total) by mouth daily before breakfast. 30 tablet 6  . lidocaine-prilocaine (EMLA) cream Apply 1 application topically as needed. 30 g 4  . trametinib dimethyl sulfoxide (MEKINIST) 2 MG tablet Take 1 tablet (2 mg total) by mouth daily. Take 1 hour before or 2 hours after a meal. Store refrigerated in original container. 30 tablet 4  . traMADol (ULTRAM) 50 MG tablet Take 1 tablet (50 mg total) by mouth every 6 (six) hours as needed. (Patient not taking: Reported on 07/29/2017) 60 tablet 0   No current facility-administered medications for this visit.     PHYSICAL EXAMINATION: ECOG PERFORMANCE STATUS: 0 - Asymptomatic  BP 134/76   Pulse 100   Temp 98.6 F (37 C) (Tympanic)   Resp 20   Ht _0  (1.626 m)   Wt 127 lb 13.9 oz (58 kg)   LMP 02/29/2012 (Approximate)   BMI 21.95 kg/m   Filed Weights   09/23/17 0953  Weight: 127 lb 13.9 oz (58 kg)    GENERAL: Well-nourished well-developed; Alert, no distress and comfortable. /Accompanied by family.  EYES: no pallor or icterus OROPHARYNX: no thrush or ulceration; NECK: supple; no lymph nodes felt. LYMPH:  no palpable lymphadenopathy in the axillary or inguinal regions LUNGS: Decreased breath sounds auscultation bilaterally. No wheeze or crackles HEART/CVS: regular rate & rhythm and no murmurs; No lower extremity edema ABDOMEN:abdomen soft, non-tender and normal bowel sounds. No hepatomegaly or splenomegaly.  Musculoskeletal:no  cyanosis of digits and no clubbing  PSYCH: alert & oriented x 3 with fluent speech NEURO: no focal motor/sensory deficits SKIN:  no rashes or significant lesions    LABORATORY DATA:  I have reviewed the data as listed    Component Value Date/Time   NA 135 09/19/2017 1023   K 3.7 09/19/2017 1023   CL 99 09/19/2017 1023   CO2 23 09/19/2017 1023   GLUCOSE 99 09/19/2017 1023   BUN 11 09/19/2017 1023   CREATININE 0.60 09/19/2017 1023   CALCIUM 8.8 (L) 09/19/2017 1023   PROT 6.9 09/19/2017 1023   ALBUMIN 3.7 09/19/2017 1023   AST 45 (H) 09/19/2017 1023   ALT 34 09/19/2017 1023   ALKPHOS 131 (H) 09/19/2017 1023   BILITOT 0.5 09/19/2017 1023   GFRNONAA >60 09/19/2017 1023   GFRAA >60 09/19/2017 1023    No results found for: SPEP, UPEP  Lab Results  Component Value Date   WBC 4.6 09/19/2017   NEUTROABS 2.9 09/19/2017   HGB 10.9 (L) 09/19/2017  HCT 31.2 (L) 09/19/2017   MCV 84.8 09/19/2017   PLT 189 09/19/2017      Chemistry      Component Value Date/Time   NA 135 09/19/2017 1023   K 3.7 09/19/2017 1023   CL 99 09/19/2017 1023   CO2 23 09/19/2017 1023   BUN 11 09/19/2017 1023   CREATININE 0.60 09/19/2017 1023      Component Value Date/Time   CALCIUM 8.8 (L) 09/19/2017 1023   ALKPHOS 131 (H) 09/19/2017 1023   AST 45 (H) 09/19/2017 1023   ALT 34 09/19/2017 1023   BILITOT 0.5 09/19/2017 1023       RADIOGRAPHIC STUDIES: I have personally reviewed the radiological images as listed and agreed with the findings in the report. No results found.   ASSESSMENT & PLAN:  Metastatic melanoma to liver ([place]) #Melanoma-BRAF mutant; metastatic-currently on dabrafenib plus trametinib [jan 26th2019]; September 19, 2017-CT chest and pelvis shows-continued/improved response; except slightly increased size of the left upper lobe medial nodule approximately 12 to 14 mm in size [see discussion below]  #Left upper lobe medial posterior lung nodule increased in size-would recommend  short-term CT scan in approximately 8 weeks; and if it continues to grow recommend SBRT.   #Otherwise continue current therapy tolerating extremely well  #Brain metastasis clinically no progression; stable.  MRI of the brain ordered today.  # Back pain/vertebral fracture-significant improvement of pain. STABLE. Continue Xgeva-calcium slightly low at 8.6 recommend continue calcium vitamin D.  # Hypothyroidism: On 75 mcg a day.  Recent TSH in June 2019 normal; stable.    # follow up in 1 month/labs/Xgeva.MRI prior.   # I reviewed the blood work- with the patient in detail; also reviewed the imaging independently [as summarized above]; and with the patient in detail.    Orders Placed This Encounter  Procedures  . MR Brain W Wo Contrast    Standing Status:   Future    Standing Expiration Date:   09/23/2018    Order Specific Question:   If indicated for the ordered procedure, I authorize the administration of contrast media per Radiology protocol    Answer:   Yes    Order Specific Question:   What is the patient's sedation requirement?    Answer:   No Sedation    Order Specific Question:   Does the patient have a pacemaker or implanted devices?    Answer:   No    Order Specific Question:   Use SRS Protocol?    Answer:   Yes    Order Specific Question:   Radiology Contrast Protocol - do NOT remove file path    Answer:   \\[email or web address]    Order Specific Question:   Preferred imaging location?    Answer:   [facility] (table limit-300lbs)   All questions were answered. The patient knows to call the clinic with any problems, questions or concerns.      Cammie Sickle, MD 09/23/2017 10:10 PM

## 2017-09-23 NOTE — Patient Instructions (Signed)

## 2017-09-23 NOTE — Progress Notes (Signed)
Patient here for follow-up for melanoma. She has no medical complaints.

## 2017-09-24 ENCOUNTER — Telehealth: Payer: Self-pay | Admitting: Pharmacist

## 2017-09-24 DIAGNOSIS — C7951 Secondary malignant neoplasm of bone: Secondary | ICD-10-CM

## 2017-09-24 NOTE — Telephone Encounter (Signed)
Oral Chemotherapy Pharmacist Encounter  Follow-Up Form  Called patient today to follow up regarding patient's oral chemotherapy medication: Tafinlar/Mekinist  Original Start date of oral chemotherapy: 03/2017  Pt reports 0 tablets/doses of Tafinlar/Mekinist missed in the last month.  Pt reports the following side effects: None reported  New medications?: calcium OTC product  Other Issues: None reported  Patient knows to call the office with questions or concerns. Oral Oncology Clinic will continue to follow.  Darl Pikes, PharmD, BCPS, [facility] Hematology/Oncology Clinical Pharmacist [facility] [phone]  09/24/2017 3:36 PM

## 2017-10-09 MED FILL — MEKINIST 2 MG TAB: 2 | 30 days supply | Qty: 30 | Fill #1

## 2017-10-09 MED FILL — TAFINLAR 75 MG CAPSULE: 75 | 30 days supply | Qty: 120 | Fill #1

## 2017-10-14 ENCOUNTER — Ambulatory Visit
Admission: RE | Admit: 2017-10-14 | Discharge: 2017-10-14 | Disposition: A | Discharge status: Home / Self Care | Payer: BC Managed Care – PPO | Source: Ambulatory Visit | Attending: Internal Medicine | Admitting: Internal Medicine

## 2017-10-14 ENCOUNTER — Inpatient Hospital Stay: Payer: BC Managed Care – PPO

## 2017-10-14 DIAGNOSIS — Z95828 Presence of other vascular implants and grafts: Secondary | ICD-10-CM

## 2017-10-14 DIAGNOSIS — C787 Secondary malignant neoplasm of liver and intrahepatic bile duct: Secondary | ICD-10-CM | POA: Insufficient documentation

## 2017-10-14 DIAGNOSIS — C7951 Secondary malignant neoplasm of bone: Secondary | ICD-10-CM | POA: Diagnosis not present

## 2017-10-14 DIAGNOSIS — C7931 Secondary malignant neoplasm of brain: Secondary | ICD-10-CM | POA: Diagnosis present

## 2017-10-14 MED ORDER — HEPARIN SOD (PORK) LOCK FLUSH 100 UNIT/ML IV SOLN
500.0000 [IU] | Freq: Once | INTRAVENOUS | Status: AC
  Administered 2017-10-14: 500 [IU] via INTRAVENOUS

## 2017-10-14 MED ORDER — GADOBENATE DIMEGLUMINE 529 MG/ML IV SOLN
10.0000 mL | Freq: Once | INTRAVENOUS | Status: AC | PRN
  Administered 2017-10-14: 10 mL via INTRAVENOUS

## 2017-10-14 MED ORDER — SODIUM CHLORIDE 0.9% FLUSH
10.0000 mL | INTRAVENOUS | Status: DC | PRN
  Administered 2017-10-14: 10 mL via INTRAVENOUS
  Filled 2017-10-14: qty 10

## 2017-10-21 ENCOUNTER — Inpatient Hospital Stay (HOSPITAL_BASED_OUTPATIENT_CLINIC_OR_DEPARTMENT_OTHER): Payer: BC Managed Care – PPO | Admitting: Internal Medicine

## 2017-10-21 ENCOUNTER — Inpatient Hospital Stay: Payer: BC Managed Care – PPO

## 2017-10-21 ENCOUNTER — Other Ambulatory Visit: Payer: Self-pay

## 2017-10-21 ENCOUNTER — Encounter: Payer: Self-pay | Admitting: Internal Medicine

## 2017-10-21 VITALS — BP 136/84 | HR 88 | Temp 97.6°F | Resp 20 | Ht 64.0 in | Wt 128.0 lb

## 2017-10-21 DIAGNOSIS — C787 Secondary malignant neoplasm of liver and intrahepatic bile duct: Secondary | ICD-10-CM

## 2017-10-21 DIAGNOSIS — C439 Malignant melanoma of skin, unspecified: Secondary | ICD-10-CM

## 2017-10-21 DIAGNOSIS — C7951 Secondary malignant neoplasm of bone: Secondary | ICD-10-CM | POA: Diagnosis not present

## 2017-10-21 DIAGNOSIS — C7800 Secondary malignant neoplasm of unspecified lung: Secondary | ICD-10-CM

## 2017-10-21 DIAGNOSIS — E039 Hypothyroidism, unspecified: Secondary | ICD-10-CM

## 2017-10-21 DIAGNOSIS — C7931 Secondary malignant neoplasm of brain: Secondary | ICD-10-CM

## 2017-10-21 LAB — COMPREHENSIVE METABOLIC PANEL
ALBUMIN: 3.4 g/dL — AB (ref 3.5–5.0)
ALT: 24 U/L (ref 0–44)
ANION GAP: 10 (ref 5–15)
AST: 29 U/L (ref 15–41)
Alkaline Phosphatase: 161 U/L — ABNORMAL HIGH (ref 38–126)
BILIRUBIN TOTAL: 0.5 mg/dL (ref 0.3–1.2)
BUN: 13 mg/dL (ref 6–20)
CHLORIDE: 100 mmol/L (ref 98–111)
CO2: 26 mmol/L (ref 22–32)
Calcium: 8.8 mg/dL — ABNORMAL LOW (ref 8.9–10.3)
Creatinine, Ser: 0.67 mg/dL (ref 0.44–1.00)
GFR calc Af Amer: >60 mL/min (ref >60)
GFR calc non Af Amer: >60 mL/min (ref >60)
GLUCOSE: 93 mg/dL (ref 70–99)
Potassium: 3.5 mmol/L (ref 3.5–5.1)
SODIUM: 136 mmol/L (ref 135–145)
TOTAL PROTEIN: 6.9 g/dL (ref 6.5–8.1)

## 2017-10-21 LAB — CBC WITH DIFFERENTIAL/PLATELET
BASOS ABS: 0 10*3/uL (ref 0–0.1)
Basophils Relative: 1 %
EOS PCT: 2 %
Eosinophils Absolute: 0.1 10*3/uL (ref 0–0.7)
HEMATOCRIT: 33.9 % — AB (ref 35.0–47.0)
Hemoglobin: 11.5 g/dL — ABNORMAL LOW (ref 12.0–16.0)
LYMPHS ABS: 0.8 10*3/uL — AB (ref 1.0–3.6)
LYMPHS PCT: 22 %
MCH: 29.6 pg (ref 26.0–34.0)
MCHC: 34 g/dL (ref 32.0–36.0)
MCV: 87.1 fL (ref 80.0–100.0)
MONO ABS: 0.5 10*3/uL (ref 0.2–0.9)
MONOS PCT: 13 %
NEUTROS ABS: 2.3 10*3/uL (ref 1.4–6.5)
Neutrophils Relative %: 62 %
Platelets: 213 10*3/uL (ref 150–440)
RBC: 3.9 MIL/uL (ref 3.80–5.20)
RDW: 15 % — ABNORMAL HIGH (ref 11.5–14.5)
WBC: 3.7 10*3/uL (ref 3.6–11.0)

## 2017-10-21 LAB — LACTATE DEHYDROGENASE: LDH: 285 U/L — ABNORMAL HIGH (ref 98–192)

## 2017-10-21 MED ORDER — DENOSUMAB 120 MG/1.7ML ~~LOC~~ SOLN
120.0000 mg | Freq: Once | SUBCUTANEOUS | Status: AC
  Administered 2017-10-21: 120 mg via SUBCUTANEOUS

## 2017-10-21 NOTE — Progress Notes (Signed)
[place] OFFICE PROGRESS NOTE  Patient Care Team: Dion Body, MD as PCP - General (Family Medicine)  Cancer Staging No matching staging information was found for the patient.   Oncology History   # NOV 2018-RECURRENT METASTATIC MELANOMA MULTIPLE LIVER LESIONS/lung lesions [s/p Bx on Nov 30th]; DEc 2018- PET-multiple lung liver bone metastasis.   # Dec 11th,2018- ipi+Nivo s/p 2 cycles;  # Jan 23rd 2019 CT scan- Progression; Jan 2019- MRI brain- improved SBRT lesion; worsening 9 mm left parietal; new 2 mm right parietal  #April 19, 2017- Dabrafenib+Mekinist; March 24th- significant PR.   # Dec 1st 2018- Brain mets-Right frontal 74m [s/p SBRT- dec 21st]; and 2 other 2-4 mm. March 2019- Improved   # Bone mets- X-geva [12/04]; plan RT   # Hx of Melanoma- dec 2011 [SLNBx- 0/4-Neg;[facility] ]; Hx of thyroid cancer- feb 2001- s/p left throid lobectomy [[place]]; NO RAIU.  # Molecular testing: Foundation: **BRAF V600E mutation; TMB-H; MSS; ------------------------------------------------    DIAGNOSIS: MELANOMA- B-raf pos  STAGE:   IV   ;GOALS: palliative  CURRENT/MOST RECENT THERAPY- dab+mek        Metastatic melanoma to liver ([facility]      INTERVAL HISTORY:  Tanya Orlowski89y.o.  female pleasant patient above history of metastatic melanoma BRAF positive currently on BRAF inhibitor plus MAC inhibitor is here for follow-up/review results of MRI brain.  Patient denies any fevers.  Denies any chills.  No nausea no vomiting.  No headaches.   Review of Systems  Constitutional: Negative for chills, diaphoresis, fever, malaise/fatigue and weight loss.  HENT: Negative for nosebleeds and sore throat.   Eyes: Negative for double vision.  Respiratory: Negative for cough, hemoptysis, sputum production, shortness of breath and wheezing.   Cardiovascular: Negative for chest pain, palpitations, orthopnea and leg swelling.  Gastrointestinal: Negative for abdominal  pain, blood in stool, constipation, diarrhea, heartburn, melena, nausea and vomiting.  Genitourinary: Negative for dysuria, frequency and urgency.  Musculoskeletal: Negative for back pain and joint pain.  Skin: Negative.  Negative for itching and rash.  Neurological: Negative for dizziness, tingling, focal weakness, weakness and headaches.  Endo/Heme/Allergies: Does not bruise/bleed easily.  Psychiatric/Behavioral: Negative for depression. The patient is not nervous/anxious and does not have insomnia.       PAST MEDICAL HISTORY :  Past Medical History:  Diagnosis Date  . Hypothyroidism   . [place]anoma of skin ([place] 2011   Resected from Right neck area with 4 lymph nodes as well.   . [place]   Partial thyroidectomy    PAST SURGICAL HISTORY :   Past Surgical History:  Procedure Laterality Date  . BREAST BIOPSY Right 2005   neg  . BREAST BIOPSY Left 07/11/2016   radial scar  . BREAST EXCISIONAL BIOPSY Left 08/06/2016   lumpectomy for radial scar  . BREAST LUMPECTOMY WITH NEEDLE LOCALIZATION Left 08/06/2016   Procedure: BREAST LUMPECTOMY WITH NEEDLE LOCALIZATION;  Surgeon: SLeonie Green MD;  Location: [facility] ORS;  Service: General;  Laterality: Left;  .Marland KitchenMELANOMA EXCISION  2011   ear  . PORTA CATH INSERTION N/A 03/14/2017   Procedure: PORTA CATH INSERTION;  Surgeon: SKatha Cabal MD;  [facility];  Service: Cardiovascular;  Laterality: N/A;  . THYROIDECTOMY, PARTIAL Left     FAMILY HISTORY :   Family History  Problem Relation Age of Onset  . Breast cancer Mother 532 . Breast cancer Paternal Aunt   . Colon cancer Brother  59    SOCIAL HISTORY:   Social History   Tobacco Use  . Smoking status: Former Smoker    Packs/day: 0.25    Years: 20.00    Pack years: 5.00    Types: Cigarettes    Last attempt to quit: 03/26/1999    Years since quitting: 18.6  . Smokeless tobacco: Never Used  Substance Use Topics  . Alcohol use: Yes     Comment: WEEKENDS  . Drug use: No    ALLERGIES:  is allergic to tetanus-diphtheria toxoids td.  MEDICATIONS:  Current Outpatient Medications  Medication Sig Dispense Refill  . atorvastatin (LIPITOR) 40 MG tablet Take 40 mg by mouth every evening.    . Calcium Carbonate-Vitamin D (CALCIUM 500/VITAMIN D PO) Take 1 tablet by mouth 2 (two) times daily.    Marland Kitchen dabrafenib mesylate (TAFINLAR) 75 MG capsule Take 2 capsules (150 mg total) by mouth 2 (two) times daily. Take on an empty stomach 1 hour before or 2 hours after meals. 120 capsule 4  . levothyroxine (SYNTHROID) 75 MCG tablet Take 1 tablet (75 mcg total) by mouth daily before breakfast. 30 tablet 6  . lidocaine-prilocaine (EMLA) cream Apply 1 application topically as needed. 30 g 4  . trametinib dimethyl sulfoxide (MEKINIST) 2 MG tablet Take 1 tablet (2 mg total) by mouth daily. Take 1 hour before or 2 hours after a meal. Store refrigerated in original container. 30 tablet 4  . traMADol (ULTRAM) 50 MG tablet Take 1 tablet (50 mg total) by mouth every 6 (six) hours as needed. (Patient not taking: Reported on 07/29/2017) 60 tablet 0   No current facility-administered medications for this visit.     PHYSICAL EXAMINATION: ECOG PERFORMANCE STATUS: 0 - Asymptomatic  BP 136/84 (Patient Position: Sitting)   Pulse 88   Temp 97.6 F (36.4 C) (Tympanic)   Resp 20   Ht _0  (1.626 m)   Wt 128 lb (58.1 kg)   LMP 02/29/2012 (Approximate)   BMI 21.97 kg/m   Filed Weights   10/21/17 0955  Weight: 128 lb (58.1 kg)    GENERAL: Well-nourished well-developed; Alert, no distress and comfortable.  accompanied by family.  EYES: no pallor or icterus OROPHARYNX: no thrush or ulceration; NECK: supple; no lymph nodes felt. LYMPH:  no palpable lymphadenopathy in the axillary or inguinal regions LUNGS: Decreased breath sounds auscultation bilaterally. No wheeze or crackles HEART/CVS: regular rate & rhythm and no murmurs; No lower extremity  edema ABDOMEN:abdomen soft, non-tender and normal bowel sounds. No hepatomegaly or splenomegaly.  Musculoskeletal:no cyanosis of digits and no clubbing  PSYCH: alert & oriented x 3 with fluent speech NEURO: no focal motor/sensory deficits SKIN:  no rashes or significant lesions    LABORATORY DATA:  I have reviewed the data as listed    Component Value Date/Time   NA 136 10/21/2017 0943   K 3.5 10/21/2017 0943   CL 100 10/21/2017 0943   CO2 26 10/21/2017 0943   GLUCOSE 93 10/21/2017 0943   BUN 13 10/21/2017 0943   CREATININE 0.67 10/21/2017 0943   CALCIUM 8.8 (L) 10/21/2017 0943   PROT 6.9 10/21/2017 0943   ALBUMIN 3.4 (L) 10/21/2017 0943   AST 29 10/21/2017 0943   ALT 24 10/21/2017 0943   ALKPHOS 161 (H) 10/21/2017 0943   BILITOT 0.5 10/21/2017 0943   GFRNONAA >60 10/21/2017 0943   GFRAA >60 10/21/2017 0943    No results found for: SPEP, UPEP  Lab Results  Component Value Date  WBC 3.7 10/21/2017   NEUTROABS 2.3 10/21/2017   HGB 11.5 (L) 10/21/2017   HCT 33.9 (L) 10/21/2017   MCV 87.1 10/21/2017   PLT 213 10/21/2017      Chemistry      Component Value Date/Time   NA 136 10/21/2017 0943   K 3.5 10/21/2017 0943   CL 100 10/21/2017 0943   CO2 26 10/21/2017 0943   BUN 13 10/21/2017 0943   CREATININE 0.67 10/21/2017 0943      Component Value Date/Time   CALCIUM 8.8 (L) 10/21/2017 0943   ALKPHOS 161 (H) 10/21/2017 0943   AST 29 10/21/2017 0943   ALT 24 10/21/2017 0943   BILITOT 0.5 10/21/2017 0943       RADIOGRAPHIC STUDIES: I have personally reviewed the radiological images as listed and agreed with the findings in the report. No results found.   ASSESSMENT & PLAN:  Metastatic melanoma to liver ([place]) #Melanoma-BRAF mutant; metastatic-currently on dabrafenib plus trametinib [jan 26 th2019]; September 19, 2017-CT chest and pelvis shows-continued/improved response; except slightly increased size of the left upper lobe medial nodule approximately 12 to 14 mm in  size [see discussion below] clinically stable.  #Continue current therapy.  With regards to left upper lobe lung nodule since increasing size-we will repeat a CT scan in approximately 1 month.  Continues increasing assessment recommend SBRT.  #Left upper lobe medial posterior lung nodule increased in size-would recommend short-term CT scan in approximately 8 weeks; and if it continues to grow recommend SBRT.   #Brain metastasis clinically no progression; stable.  MRI brain stable disease.  No progression.  # Back pain/vertebral fracture-significant improvement of pain.  Stable.  Continue Xgeva plus calcium plus vitamin D.  # Hypothyroidism-75 mcg of Synthroid.  Stable.  # follow up in 1 month/labs/Xgeva CT scan chest prior.  # I reviewed the blood work- with the patient in detail; also reviewed the imaging independently [as summarized above]; and with the patient in detail.    Orders Placed This Encounter  Procedures  . CT CHEST W CONTRAST    Standing Status:   Future    Standing Expiration Date:   10/22/2018    Order Specific Question:   If indicated for the ordered procedure, I authorize the administration of contrast media per Radiology protocol    Answer:   Yes    Order Specific Question:   Preferred imaging location?    Answer:   [facility]    Order Specific Question:   Call Results- Best Contact Number?    Answer:   metastatic melanoma    Order Specific Question:   Radiology Contrast Protocol - do NOT remove file path    Answer:   \\[email or web address]    Order Specific Question:   ** REASON FOR EXAM (FREE TEXT)    Answer:   metastatic melanoma to lung and liver    Order Specific Question:   Is patient pregnant?    Answer:   No  . Lactate dehydrogenase    Standing Status:   Future    Number of Occurrences:   1    Standing Expiration Date:   10/21/2018  . CBC with Differential    Standing Status:   Standing    Number of Occurrences:   12     Standing Expiration Date:   10/22/2018  . Comprehensive metabolic panel    Standing Status:   Standing    Number of Occurrences:   12    Standing Expiration Date:  10/22/2018  . Lactate dehydrogenase    Standing Status:   Standing    Number of Occurrences:   12    Standing Expiration Date:   10/22/2018   All questions were answered. The patient knows to call the clinic with any problems, questions or concerns.      Cammie Sickle, MD 11/02/2017 5:18 PM

## 2017-10-21 NOTE — Patient Instructions (Signed)

## 2017-10-21 NOTE — Assessment & Plan Note (Addendum)
#  Melanoma-BRAF mutant; metastatic-currently on dabrafenib plus trametinib [jan 26 th2019]; September 19, 2017-CT chest and pelvis shows-continued/improved response; except slightly increased size of the left upper lobe medial nodule approximately 12 to 14 mm in size [see discussion below] clinically stable.  #Continue current therapy.  With regards to left upper lobe lung nodule since increasing size-we will repeat a CT scan in approximately 1 month.  Continues increasing assessment recommend SBRT.  #Left upper lobe medial posterior lung nodule increased in size-would recommend short-term CT scan in approximately 8 weeks; and if it continues to grow recommend SBRT.   #Brain metastasis clinically no progression; stable.  MRI brain stable disease.  No progression.  # Back pain/vertebral fracture-significant improvement of pain.  Stable.  Continue Xgeva plus calcium plus vitamin D.  # Hypothyroidism-75 mcg of Synthroid.  Stable.  # follow up in 1 month/labs/Xgeva CT scan chest prior.  # I reviewed the blood work- with the patient in detail; also reviewed the imaging independently [as summarized above]; and with the patient in detail.

## 2017-11-06 MED FILL — MEKINIST 2 MG TAB: 2 | 30 days supply | Qty: 30 | Fill #2

## 2017-11-06 MED FILL — TAFINLAR 75 MG CAPSULE: 75 | 30 days supply | Qty: 120 | Fill #2

## 2017-11-17 ENCOUNTER — Inpatient Hospital Stay: Payer: BC Managed Care – PPO | Attending: Internal Medicine

## 2017-11-17 ENCOUNTER — Ambulatory Visit
Admission: RE | Admit: 2017-11-17 | Discharge: 2017-11-17 | Disposition: A | Discharge status: Home / Self Care | Payer: BC Managed Care – PPO | Source: Ambulatory Visit | Attending: Internal Medicine | Admitting: Internal Medicine

## 2017-11-17 DIAGNOSIS — Z87891 Personal history of nicotine dependence: Secondary | ICD-10-CM | POA: Diagnosis not present

## 2017-11-17 DIAGNOSIS — C7800 Secondary malignant neoplasm of unspecified lung: Secondary | ICD-10-CM | POA: Insufficient documentation

## 2017-11-17 DIAGNOSIS — C7951 Secondary malignant neoplasm of bone: Secondary | ICD-10-CM | POA: Insufficient documentation

## 2017-11-17 DIAGNOSIS — C439 Malignant melanoma of skin, unspecified: Secondary | ICD-10-CM | POA: Diagnosis not present

## 2017-11-17 DIAGNOSIS — K449 Diaphragmatic hernia without obstruction or gangrene: Secondary | ICD-10-CM | POA: Diagnosis not present

## 2017-11-17 DIAGNOSIS — C787 Secondary malignant neoplasm of liver and intrahepatic bile duct: Secondary | ICD-10-CM | POA: Insufficient documentation

## 2017-11-17 DIAGNOSIS — Z79899 Other long term (current) drug therapy: Secondary | ICD-10-CM | POA: Diagnosis not present

## 2017-11-17 DIAGNOSIS — C7931 Secondary malignant neoplasm of brain: Secondary | ICD-10-CM | POA: Diagnosis not present

## 2017-11-17 DIAGNOSIS — E039 Hypothyroidism, unspecified: Secondary | ICD-10-CM | POA: Diagnosis not present

## 2017-11-17 LAB — CBC WITH DIFFERENTIAL/PLATELET
BASOS ABS: 0 10*3/uL (ref 0–0.1)
BASOS PCT: 1 %
Eosinophils Absolute: 0.1 10*3/uL (ref 0–0.7)
Eosinophils Relative: 2 %
HEMATOCRIT: 31.3 % — AB (ref 35.0–47.0)
HEMOGLOBIN: 10.6 g/dL — AB (ref 12.0–16.0)
LYMPHS PCT: 29 %
Lymphs Abs: 1 10*3/uL (ref 1.0–3.6)
MCH: 29.4 pg (ref 26.0–34.0)
MCHC: 33.9 g/dL (ref 32.0–36.0)
MCV: 86.8 fL (ref 80.0–100.0)
Monocytes Absolute: 0.5 10*3/uL (ref 0.2–0.9)
Monocytes Relative: 14 %
NEUTROS ABS: 1.9 10*3/uL (ref 1.4–6.5)
NEUTROS PCT: 54 %
Platelets: 231 10*3/uL (ref 150–440)
RBC: 3.61 MIL/uL — ABNORMAL LOW (ref 3.80–5.20)
RDW: 14.6 % — ABNORMAL HIGH (ref 11.5–14.5)
WBC: 3.5 10*3/uL — ABNORMAL LOW (ref 3.6–11.0)

## 2017-11-17 LAB — COMPREHENSIVE METABOLIC PANEL
ALK PHOS: 136 U/L — AB (ref 38–126)
ALT: 21 U/L (ref 0–44)
ANION GAP: 11 (ref 5–15)
AST: 31 U/L (ref 15–41)
Albumin: 3.2 g/dL — ABNORMAL LOW (ref 3.5–5.0)
BILIRUBIN TOTAL: 0.5 mg/dL (ref 0.3–1.2)
BUN: 11 mg/dL (ref 6–20)
CALCIUM: 8.2 mg/dL — AB (ref 8.9–10.3)
CO2: 22 mmol/L (ref 22–32)
Chloride: 102 mmol/L (ref 98–111)
Creatinine, Ser: 0.61 mg/dL (ref 0.44–1.00)
GFR calc Af Amer: >60 mL/min (ref >60)
GFR calc non Af Amer: >60 mL/min (ref >60)
Glucose, Bld: 101 mg/dL — ABNORMAL HIGH (ref 70–99)
POTASSIUM: 3.4 mmol/L — AB (ref 3.5–5.1)
SODIUM: 135 mmol/L (ref 135–145)
Total Protein: 6.8 g/dL (ref 6.5–8.1)

## 2017-11-17 LAB — LACTATE DEHYDROGENASE: LDH: 267 U/L — ABNORMAL HIGH (ref 98–192)

## 2017-11-17 MED ORDER — SODIUM CHLORIDE 0.9% FLUSH
10.0000 mL | INTRAVENOUS | Status: DC | PRN
  Administered 2017-11-17: 10 mL via INTRAVENOUS
  Filled 2017-11-17: qty 10

## 2017-11-17 MED ORDER — IOHEXOL 300 MG/ML  SOLN
75.0000 mL | Freq: Once | INTRAMUSCULAR | Status: AC | PRN
  Administered 2017-11-17: 75 mL via INTRAVENOUS

## 2017-11-17 MED ORDER — HEPARIN SOD (PORK) LOCK FLUSH 100 UNIT/ML IV SOLN
500.0000 [IU] | Freq: Once | INTRAVENOUS | Status: AC
  Administered 2017-11-17: 500 [IU] via INTRAVENOUS
  Filled 2017-11-17: qty 5

## 2017-11-18 ENCOUNTER — Inpatient Hospital Stay (HOSPITAL_BASED_OUTPATIENT_CLINIC_OR_DEPARTMENT_OTHER): Payer: BC Managed Care – PPO | Admitting: Internal Medicine

## 2017-11-18 ENCOUNTER — Inpatient Hospital Stay: Payer: BC Managed Care – PPO

## 2017-11-18 ENCOUNTER — Encounter: Payer: Self-pay | Admitting: Internal Medicine

## 2017-11-18 DIAGNOSIS — C7951 Secondary malignant neoplasm of bone: Secondary | ICD-10-CM | POA: Diagnosis not present

## 2017-11-18 DIAGNOSIS — C7931 Secondary malignant neoplasm of brain: Secondary | ICD-10-CM | POA: Diagnosis not present

## 2017-11-18 DIAGNOSIS — C7800 Secondary malignant neoplasm of unspecified lung: Secondary | ICD-10-CM

## 2017-11-18 DIAGNOSIS — Z87891 Personal history of nicotine dependence: Secondary | ICD-10-CM

## 2017-11-18 DIAGNOSIS — C787 Secondary malignant neoplasm of liver and intrahepatic bile duct: Secondary | ICD-10-CM | POA: Diagnosis not present

## 2017-11-18 DIAGNOSIS — Z85850 Personal history of malignant neoplasm of thyroid: Secondary | ICD-10-CM

## 2017-11-18 DIAGNOSIS — C439 Malignant melanoma of skin, unspecified: Secondary | ICD-10-CM

## 2017-11-18 DIAGNOSIS — E039 Hypothyroidism, unspecified: Secondary | ICD-10-CM

## 2017-11-18 MED ORDER — DENOSUMAB 120 MG/1.7ML ~~LOC~~ SOLN
120.0000 mg | Freq: Once | SUBCUTANEOUS | Status: AC
  Administered 2017-11-18: 120 mg via SUBCUTANEOUS

## 2017-11-18 NOTE — Patient Instructions (Signed)

## 2017-11-18 NOTE — Assessment & Plan Note (Addendum)
#  Melanoma-BRAF mutant; metastatic-currently on dabrafenib plus trametinib [jan 26 th2019]; AUG 26th CT chest slightly increased size of the left upper lobe medial nodule by few mm; few new nodules. WORSENED.   #Continue current therapy for now- with dabrafenib plus Mekinist.  For now my plan is to repeat a scan chest and pelvis in 2 months.  Will reach out to [facility] re: further options/clinical trial options.  #Brain metastasis clinically no progression; stable. July 23rd MRI brain stable disease.  Stable  # Back pain/vertebral fracture-significant improvement of pain.  Stable continue Xgeva plus calcium plus vitamin D.  # Hypothyroidism-75 mcg of Synthroid.  Stable.   # follow up in 22month/labs- cbc/cmp/ldh; x-geva.   # I reviewed the blood work- with the patient in detail; also reviewed the imaging independently [as summarized above]; and with the patient in detail.   Cc; Dr.Colichio/ [facility].

## 2017-11-18 NOTE — Progress Notes (Signed)
[place] OFFICE PROGRESS NOTE  Patient Care Team: Dion Body, MD as PCP - General (Family Medicine)  Cancer Staging No matching staging information was found for the patient.   Oncology History   # NOV 2018-RECURRENT METASTATIC MELANOMA MULTIPLE LIVER LESIONS/lung lesions [s/p Bx on Nov 30th]; DEc 2018- PET-multiple lung liver bone metastasis.   # Dec 11th,2018- ipi+Nivo s/p 2 cycles;  # Jan 23rd 2019 CT scan- Progression; Jan 2019- MRI brain- improved SBRT lesion; worsening 9 mm left parietal; new 2 mm right parietal; July 23rd MRI-Improved  #April 19, 2017- Dabrafenib+Mekinist; March 24th- significant PR.   # Dec 1st 2018- Brain mets-Right frontal 24m [s/p SBRT- dec 21st]; and 2 other 2-4 mm. March 2019- Improved; AUG 2019- Progression of Lung nodules.   # Bone mets- X-geva [12/04]; plan RT   # Hx of Melanoma- dec 2011 [SLNBx- 0/4-Neg;[facility] ]; Hx of thyroid cancer- feb 2001- s/p left throid lobectomy [[place]]; NO RAIU.  # Molecular testing: Foundation: **BRAF V600E mutation; TMB-H; MSS; ------------------------------------------------    DIAGNOSIS: MELANOMA- B-raf pos  STAGE:   IV   ;GOALS: palliative  CURRENT/MOST RECENT THERAPY- dab+mek        Metastatic melanoma to liver ([facility]      INTERVAL HISTORY:  CJorryn Hershberger89y.o.  female pleasant patient above history of metastatic melanoma currently on B-Raf + MEK-inhibitor is here for follow-up review the CT scan.  Denies any pain.  Denies any nausea vomiting.  Denies any headaches.  Mild to moderate fatigue.  Otherwise no new lumps or bumps  Review of Systems  Constitutional: Positive for malaise/fatigue. Negative for chills, diaphoresis, fever and weight loss.  HENT: Negative for nosebleeds and sore throat.   Eyes: Negative for double vision.  Respiratory: Negative for cough, hemoptysis, sputum production, shortness of breath and wheezing.   Cardiovascular: Negative for chest pain,  palpitations, orthopnea and leg swelling.  Gastrointestinal: Negative for abdominal pain, blood in stool, constipation, diarrhea, heartburn, melena, nausea and vomiting.  Genitourinary: Negative for dysuria, frequency and urgency.  Musculoskeletal: Negative for back pain and joint pain.  Skin: Negative.  Negative for itching and rash.  Neurological: Negative for dizziness, tingling, focal weakness, weakness and headaches.  Endo/Heme/Allergies: Does not bruise/bleed easily.  Psychiatric/Behavioral: Negative for depression. The patient is not nervous/anxious and does not have insomnia.       PAST MEDICAL HISTORY :  Past Medical History:  Diagnosis Date  . Hypothyroidism   . [place]   Resected from Right neck area with 4 lymph nodes as well.   . [place]   Partial thyroidectomy    PAST SURGICAL HISTORY :   Past Surgical History:  Procedure Laterality Date  . BREAST BIOPSY Right 2005   neg  . BREAST BIOPSY Left 07/11/2016   radial scar  . BREAST EXCISIONAL BIOPSY Left 08/06/2016   lumpectomy for radial scar  . BREAST LUMPECTOMY WITH NEEDLE LOCALIZATION Left 08/06/2016   Procedure: BREAST LUMPECTOMY WITH NEEDLE LOCALIZATION;  Surgeon: SLeonie Green MD;  Location: [facility] ORS;  Service: General;  Laterality: Left;  .Marland KitchenMELANOMA EXCISION  2011   ear  . PORTA CATH INSERTION N/A 03/14/2017   Procedure: PORTA CATH INSERTION;  Surgeon: SKatha Cabal MD;  [facility];  Service: Cardiovascular;  Laterality: N/A;  . THYROIDECTOMY, PARTIAL Left     FAMILY HISTORY :   Family History  Problem Relation Age of Onset  . Breast cancer  Mother 28  . Breast cancer Paternal Aunt   . Colon cancer Brother 67    SOCIAL HISTORY:   Social History   Tobacco Use  . Smoking status: Former Smoker    Packs/day: 0.25    Years: 20.00    Pack years: 5.00    Types: Cigarettes    Last attempt to quit: 03/26/1999    Years since quitting:  18.6  . Smokeless tobacco: Never Used  Substance Use Topics  . Alcohol use: Yes    Comment: WEEKENDS  . Drug use: No    ALLERGIES:  is allergic to tetanus-diphtheria toxoids td.  MEDICATIONS:  Current Outpatient Medications  Medication Sig Dispense Refill  . atorvastatin (LIPITOR) 40 MG tablet Take 40 mg by mouth every evening.    . Calcium Carbonate-Vitamin D (CALCIUM 500/VITAMIN D PO) Take 1 tablet by mouth 2 (two) times daily.    Marland Kitchen dabrafenib mesylate (TAFINLAR) 75 MG capsule Take 2 capsules (150 mg total) by mouth 2 (two) times daily. Take on an empty stomach 1 hour before or 2 hours after meals. 120 capsule 4  . levothyroxine (SYNTHROID) 75 MCG tablet Take 1 tablet (75 mcg total) by mouth daily before breakfast. 30 tablet 6  . lidocaine-prilocaine (EMLA) cream Apply 1 application topically as needed. 30 g 4  . trametinib dimethyl sulfoxide (MEKINIST) 2 MG tablet Take 1 tablet (2 mg total) by mouth daily. Take 1 hour before or 2 hours after a meal. Store refrigerated in original container. 30 tablet 4  . traMADol (ULTRAM) 50 MG tablet Take 1 tablet (50 mg total) by mouth every 6 (six) hours as needed. (Patient not taking: Reported on 07/29/2017) 60 tablet 0   No current facility-administered medications for this visit.     PHYSICAL EXAMINATION: ECOG PERFORMANCE STATUS: 1 - Symptomatic but completely ambulatory  BP 114/78 (BP Location: Left Arm, Patient Position: Sitting)   Pulse 90   Temp (!) 97.1 F (36.2 C) (Tympanic)   Resp 16   Wt 127 lb (57.6 kg)   LMP 02/29/2012 (Approximate)   BMI 21.80 kg/m   Filed Weights   11/18/17 1000  Weight: 127 lb (57.6 kg)   Physical Exam  Constitutional: She is oriented to person, place, and time and well-developed, well-nourished, and in no distress.  She is walking herself.  Accompanied by husband.  HENT:  Head: Normocephalic and atraumatic.  Mouth/Throat: Oropharynx is clear and moist. No oropharyngeal exudate.  Eyes: Pupils are  equal, round, and reactive to light.  Neck: Normal range of motion. Neck supple.  Cardiovascular: Normal rate and regular rhythm.  Pulmonary/Chest: No respiratory distress. She has no wheezes.  Abdominal: Soft. Bowel sounds are normal. She exhibits no distension and no mass. There is no tenderness. There is no rebound and no guarding.  Musculoskeletal: Normal range of motion. She exhibits no edema or tenderness.  Neurological: She is alert and oriented to person, place, and time.  Skin: Skin is warm.  Psychiatric: Affect normal.     LABORATORY DATA:  I have reviewed the data as listed    Component Value Date/Time   NA 135 11/17/2017 1047   K 3.4 (L) 11/17/2017 1047   CL 102 11/17/2017 1047   CO2 22 11/17/2017 1047   GLUCOSE 101 (H) 11/17/2017 1047   BUN 11 11/17/2017 1047   CREATININE 0.61 11/17/2017 1047   CALCIUM 8.2 (L) 11/17/2017 1047   PROT 6.8 11/17/2017 1047   ALBUMIN 3.2 (L) 11/17/2017 1047   AST  31 11/17/2017 1047   ALT 21 11/17/2017 1047   ALKPHOS 136 (H) 11/17/2017 1047   BILITOT 0.5 11/17/2017 1047   GFRNONAA >60 11/17/2017 1047   GFRAA >60 11/17/2017 1047    No results found for: SPEP, UPEP  Lab Results  Component Value Date   WBC 3.5 (L) 11/17/2017   NEUTROABS 1.9 11/17/2017   HGB 10.6 (L) 11/17/2017   HCT 31.3 (L) 11/17/2017   MCV 86.8 11/17/2017   PLT 231 11/17/2017      Chemistry      Component Value Date/Time   NA 135 11/17/2017 1047   K 3.4 (L) 11/17/2017 1047   CL 102 11/17/2017 1047   CO2 22 11/17/2017 1047   BUN 11 11/17/2017 1047   CREATININE 0.61 11/17/2017 1047      Component Value Date/Time   CALCIUM 8.2 (L) 11/17/2017 1047   ALKPHOS 136 (H) 11/17/2017 1047   AST 31 11/17/2017 1047   ALT 21 11/17/2017 1047   BILITOT 0.5 11/17/2017 1047       RADIOGRAPHIC STUDIES: I have personally reviewed the radiological images as listed and agreed with the findings in the report. Ct Chest W Contrast  Result Date: 11/17/2017 CLINICAL  DATA:  Metastatic melanoma.  Restaging. EXAM: CT CHEST WITH CONTRAST TECHNIQUE: Multidetector CT imaging of the chest was performed during intravenous contrast administration. CONTRAST:  37m OMNIPAQUE IOHEXOL 300 MG/ML  SOLN COMPARISON:  09/11/2017 chest CT. FINDINGS: Cardiovascular: Normal heart size. No significant pericardial effusion/thickening. Right internal jugular MediPort terminates at the cavoatrial junction. Great vessels are normal in course and caliber. No central pulmonary emboli. Mediastinum/Nodes: Left hemithyroidectomy. Subcentimeter hypodense right thyroid lobe nodule is stable. Unremarkable esophagus. No pathologically enlarged axillary, mediastinal or hilar lymph nodes. Several subcentimeter short axis mediastinal and bilateral hilar lymph nodes have increased in size. For example a 0.8 cm left hilar node (series 2/image 74), increased from 0.5 cm. A 0.9 cm left paratracheal node (series 2/image 53), increased from 0.7 cm. Lungs/Pleura: No pneumothorax. No pleural effusion. No acute consolidative airspace disease or lung masses. There are innumerable (> than 50) solid pulmonary nodules scattered throughout both lungs, lower lobe predominant, increased in size and number since 09/19/2017. Dominant medial left upper lobe 1.6 x 1.4 cm nodule (series 3/image 45), previously 1.3 x 1.1 cm, increased. Medial basilar right lower lobe 0.8 cm nodule (series 3/image 125), increased from 0.4 cm. Basilar left lower lobe 0.6 cm nodule (series 3/image 113), new. Upper abdomen: Small hiatal hernia. Scattered small simple liver cysts measuring up to 1.9 cm in the anterior left lower lobe are stable. Numerous subcentimeter hypodense lesions scattered throughout the liver are too small to characterize and not definitely changed. Musculoskeletal: Stable sclerotic L1 vertebral lesion with associated moderate to severe pathologic fracture. Stable sclerotic posterior T7 and anterior T9 vertebral lesions. No new  osseous lesions in the chest. Mild thoracic spondylosis. IMPRESSION: 1. Pulmonary metastases are increased in size and number since 09/19/2017 chest CT. Dominant medial left upper lobe 1.6 cm pulmonary nodule, previously 1.3 cm. 2. Subcentimeter mediastinal and bilateral hilar lymph nodes have increased in size in the interval, worrisome for early nodal metastatic progression. 3. Sclerotic osseous lesions in the visualized thoracolumbar spine are stable. 4. Small hiatal hernia. Electronically Signed   By: JIlona SorrelM.D.   On: 11/17/2017 15:08     ASSESSMENT & PLAN:  Metast[place] ([place] #Melanoma-BRAF mutant; metastatic-currently on dabrafenib plus trametinib [jan 26 th2019]; AUG 26th CT chest slightly  increased size of the left upper lobe medial nodule by few mm; few new nodules. WORSENED.   #Continue current therapy for now- with dabrafenib plus Mekinist.  For now my plan is to repeat a scan chest and pelvis in 2 months.  Will reach out to [place] re: further options/clinical trial options.  #Brain metastasis clinically no progression; stable. July 23rd MRI brain stable disease.  Stable  # Back pain/vertebral fracture-significant improvement of pain.  Stable continue Xgeva plus calcium plus vitamin D.  # Hypothyroidism-75 mcg of Synthroid.  Stable.   # follow up in 53month/labs- cbc/cmp/ldh; x-geva.   # I reviewed the blood work- with the patient in detail; also reviewed the imaging independently [as summarized above]; and with the patient in detail.   Cc; Dr.Colichio/ [facility].    No orders of the defined types were placed in this encounter.  All questions were answered. The patient knows to call the clinic with any problems, questions or concerns.      GCammie Sickle MD 11/18/2017 2:56 PM

## 2017-11-18 NOTE — Progress Notes (Signed)
Ca =8.2, Alb =3.2, corrected calcium +8.84

## 2017-11-21 ENCOUNTER — Telehealth: Payer: Self-pay | Admitting: Internal Medicine

## 2017-11-21 NOTE — Telephone Encounter (Signed)
Spoke to Dr.Colichio; no good options at this time; ? Phase I trials vs repeat immunotherapy- await repeat scans in 2 months. Will continue current plan of care.

## 2017-12-10 MED FILL — MEKINIST 2 MG TAB: 2 | 30 days supply | Qty: 30 | Fill #3

## 2017-12-10 MED FILL — TAFINLAR 75 MG CAPSULE: 75 | 30 days supply | Qty: 120 | Fill #3

## 2017-12-16 ENCOUNTER — Encounter: Payer: Self-pay | Admitting: Internal Medicine

## 2017-12-16 ENCOUNTER — Inpatient Hospital Stay: Payer: BC Managed Care – PPO | Attending: Internal Medicine | Admitting: Internal Medicine

## 2017-12-16 ENCOUNTER — Inpatient Hospital Stay: Payer: BC Managed Care – PPO

## 2017-12-16 VITALS — BP 133/83 | HR 90 | Temp 98.6°F | Resp 16 | Wt 128.1 lb

## 2017-12-16 DIAGNOSIS — C7931 Secondary malignant neoplasm of brain: Secondary | ICD-10-CM

## 2017-12-16 DIAGNOSIS — C7951 Secondary malignant neoplasm of bone: Secondary | ICD-10-CM

## 2017-12-16 DIAGNOSIS — C7800 Secondary malignant neoplasm of unspecified lung: Secondary | ICD-10-CM

## 2017-12-16 DIAGNOSIS — Z79899 Other long term (current) drug therapy: Secondary | ICD-10-CM | POA: Diagnosis not present

## 2017-12-16 DIAGNOSIS — E039 Hypothyroidism, unspecified: Secondary | ICD-10-CM | POA: Diagnosis not present

## 2017-12-16 DIAGNOSIS — C787 Secondary malignant neoplasm of liver and intrahepatic bile duct: Secondary | ICD-10-CM

## 2017-12-16 DIAGNOSIS — C439 Malignant melanoma of skin, unspecified: Secondary | ICD-10-CM

## 2017-12-16 DIAGNOSIS — Z87891 Personal history of nicotine dependence: Secondary | ICD-10-CM | POA: Diagnosis not present

## 2017-12-16 LAB — CBC WITH DIFFERENTIAL/PLATELET
BASOS PCT: 1 %
Basophils Absolute: 0 10*3/uL (ref 0–0.1)
EOS ABS: 0.1 10*3/uL (ref 0–0.7)
EOS PCT: 4 %
HCT: 32.8 % — ABNORMAL LOW (ref 35.0–47.0)
Hemoglobin: 11.1 g/dL — ABNORMAL LOW (ref 12.0–16.0)
Lymphocytes Relative: 30 %
Lymphs Abs: 0.9 10*3/uL — ABNORMAL LOW (ref 1.0–3.6)
MCH: 29.3 pg (ref 26.0–34.0)
MCHC: 34 g/dL (ref 32.0–36.0)
MCV: 86.4 fL (ref 80.0–100.0)
Monocytes Absolute: 0.5 10*3/uL (ref 0.2–0.9)
Monocytes Relative: 15 %
Neutro Abs: 1.6 10*3/uL (ref 1.4–6.5)
Neutrophils Relative %: 50 %
PLATELETS: 221 10*3/uL (ref 150–440)
RBC: 3.79 MIL/uL — ABNORMAL LOW (ref 3.80–5.20)
RDW: 14.2 % (ref 11.5–14.5)
WBC: 3.2 10*3/uL — ABNORMAL LOW (ref 3.6–11.0)

## 2017-12-16 LAB — COMPREHENSIVE METABOLIC PANEL
ALT: 20 U/L (ref 0–44)
AST: 25 U/L (ref 15–41)
Albumin: 3.3 g/dL — ABNORMAL LOW (ref 3.5–5.0)
Alkaline Phosphatase: 130 U/L — ABNORMAL HIGH (ref 38–126)
Anion gap: 8 (ref 5–15)
BUN: 12 mg/dL (ref 6–20)
CO2: 24 mmol/L (ref 22–32)
Calcium: 8.9 mg/dL (ref 8.9–10.3)
Chloride: 103 mmol/L (ref 98–111)
Creatinine, Ser: 0.7 mg/dL (ref 0.44–1.00)
GFR calc Af Amer: >60 mL/min (ref >60)
GFR calc non Af Amer: >60 mL/min (ref >60)
Glucose, Bld: 102 mg/dL — ABNORMAL HIGH (ref 70–99)
Potassium: 3.9 mmol/L (ref 3.5–5.1)
Sodium: 135 mmol/L (ref 135–145)
Total Bilirubin: 0.4 mg/dL (ref 0.3–1.2)
Total Protein: 7 g/dL (ref 6.5–8.1)

## 2017-12-16 LAB — LACTATE DEHYDROGENASE: LDH: 253 U/L — ABNORMAL HIGH (ref 98–192)

## 2017-12-16 MED ORDER — HEPARIN SOD (PORK) LOCK FLUSH 100 UNIT/ML IV SOLN
500.0000 [IU] | Freq: Once | INTRAVENOUS | Status: AC
  Administered 2017-12-16: 500 [IU] via INTRAVENOUS

## 2017-12-16 MED ORDER — DENOSUMAB 120 MG/1.7ML ~~LOC~~ SOLN
120.0000 mg | Freq: Once | SUBCUTANEOUS | Status: AC
  Administered 2017-12-16: 120 mg via SUBCUTANEOUS

## 2017-12-16 MED ORDER — SODIUM CHLORIDE 0.9% FLUSH
10.0000 mL | INTRAVENOUS | Status: DC | PRN
  Administered 2017-12-16: 10 mL via INTRAVENOUS
  Filled 2017-12-16: qty 10

## 2017-12-16 NOTE — Progress Notes (Signed)
[place] OFFICE PROGRESS NOTE  Patient Care Team: Dion Body, MD as PCP - General (Family Medicine)  Cancer Staging No matching staging information was found for the patient.   Oncology History   # NOV 2018-RECURRENT METASTATIC MELANOMA MULTIPLE LIVER LESIONS/lung lesions [s/p Bx on Nov 30th]; DEc 2018- PET-multiple lung liver bone metastasis.   # Dec 11th,2018- ipi+Nivo s/p 2 cycles;  # Jan 23rd 2019 CT scan- Progression; Jan 2019- MRI brain- improved SBRT lesion; worsening 9 mm left parietal; new 2 mm right parietal; July 23rd MRI-Improved  #April 19, 2017- Dabrafenib+Mekinist; March 24th- significant PR.   # Dec 1st 2018- Brain mets-Right frontal 79m [s/p SBRT- dec 21st]; and 2 other 2-4 mm. March 2019- Improved; AUG 2019- Progression of Lung nodules.   # Bone mets- X-geva [12/04]; plan RT   # Hx of Melanoma- dec 2011 [SLNBx- 0/4-Neg;[facility] ]; Hx of thyroid cancer- feb 2001- s/p left throid lobectomy [[place]]; NO RAIU.  # Molecular testing: Foundation: **BRAF V600E mutation; TMB-H; MSS; --------------------------------------------------------------------------------------    DIAGNOSIS: MELANOMA- B-raf pos  STAGE:   IV   ;GOALS: palliative  CURRENT/MOST RECENT THERAPY- dab+mek     Metastatic melanoma [facility] liver ([facility]      INTERVAL HISTORY:  CMarsi Turvey89y.o.  female pleasant patient above history of metastatic melanoma currently on B-Raf + MEK-inhibitor is here for follow-up.  patient denies any nausea vomiting.  Denies any fevers or chills.  Appetite is good.  No lumps or bumps.  Mild fatigue.  No headaches.   Review of Systems  Constitutional: Positive for malaise/fatigue. Negative for chills, diaphoresis, fever and weight loss.  HENT: Negative for nosebleeds and sore throat.   Eyes: Negative for double vision.  Respiratory: Negative for cough, hemoptysis, sputum production, shortness of breath and wheezing.   Cardiovascular:  Negative for chest pain, palpitations, orthopnea and leg swelling.  Gastrointestinal: Negative for abdominal pain, blood in stool, constipation, diarrhea, heartburn, melena, nausea and vomiting.  Genitourinary: Negative for dysuria, frequency and urgency.  Musculoskeletal: Negative for back pain and joint pain.  Skin: Negative.  Negative for itching and rash.  Neurological: Negative for dizziness, tingling, focal weakness, weakness and headaches.  Endo/Heme/Allergies: Does not bruise/bleed easily.  Psychiatric/Behavioral: Negative for depression. The patient is not nervous/anxious and does not have insomnia.       PAST MEDICAL HISTORY :  Past Medical History:  Diagnosis Date  . Hypothyroidism   . [place] of [place] ([place]   Resected from Right neck area with 4 lymph nodes as well.   . Thyroid cancer ([place] 2001   Partial thyroidectomy    PAST SURGICAL HISTORY :   Past Surgical History:  Procedure Laterality Date  . BREAST BIOPSY Right 2005   neg  . BREAST BIOPSY Left 07/11/2016   radial scar  . BREAST EXCISIONAL BIOPSY Left 08/06/2016   lumpectomy for radial scar  . BREAST LUMPECTOMY WITH NEEDLE LOCALIZATION Left 08/06/2016   Procedure: BREAST LUMPECTOMY WITH NEEDLE LOCALIZATION;  Surgeon: SLeonie Green MD;  Location: [facility] ORS;  Service: General;  Laterality: Left;  .Marland KitchenMELANOMA EXCISION  2011   ear  . PORTA CATH INSERTION N/A 03/14/2017   Procedure: PORTA CATH INSERTION;  Surgeon: SKatha Cabal MD;  [facility];  Service: Cardiovascular;  Laterality: N/A;  . THYROIDECTOMY, PARTIAL Left     FAMILY HISTORY :   Family History  Problem Relation Age of Onset  . Breast cancer Mother 525 .  Breast cancer Paternal Aunt   . Colon cancer Brother 73    SOCIAL HISTORY:   Social History   Tobacco Use  . Smoking status: Former Smoker    Packs/day: 0.25    Years: 20.00    Pack years: 5.00    Types: Cigarettes    Last attempt to quit: 03/26/1999     Years since quitting: 18.7  . Smokeless tobacco: Never Used  Substance Use Topics  . Alcohol use: Yes    Comment: WEEKENDS  . Drug use: No    ALLERGIES:  is allergic to tetanus-diphtheria toxoids td.  MEDICATIONS:  Current Outpatient Medications  Medication Sig Dispense Refill  . Calcium Carbonate-Vitamin D (CALCIUM 500/VITAMIN D PO) Take 1 tablet by mouth 2 (two) times daily.    Marland Kitchen dabrafenib mesylate (TAFINLAR) 75 MG capsule Take 2 capsules (150 mg total) by mouth 2 (two) times daily. Take on an empty stomach 1 hour before or 2 hours after meals. 120 capsule 4  . levothyroxine (SYNTHROID) 75 MCG tablet Take 1 tablet (75 mcg total) by mouth daily before breakfast. 30 tablet 6  . lidocaine-prilocaine (EMLA) cream Apply 1 application topically as needed. 30 g 4  . trametinib dimethyl sulfoxide (MEKINIST) 2 MG tablet Take 1 tablet (2 mg total) by mouth daily. Take 1 hour before or 2 hours after a meal. Store refrigerated in original container. 30 tablet 4  . atorvastatin (LIPITOR) 40 MG tablet Take 40 mg by mouth every evening.    . traMADol (ULTRAM) 50 MG tablet Take 1 tablet (50 mg total) by mouth every 6 (six) hours as needed. (Patient not taking: Reported on 07/29/2017) 60 tablet 0   No current facility-administered medications for this visit.     PHYSICAL EXAMINATION: ECOG PERFORMANCE STATUS: 1 - Symptomatic but completely ambulatory  BP 133/83 (BP Location: Left Arm, Patient Position: Sitting)   Pulse 90   Temp 98.6 F (37 C) (Tympanic)   Resp 16   Wt 128 lb 1.4 oz (58.1 kg)   LMP 02/29/2012 (Approximate)   BMI 21.99 kg/m   Filed Weights   12/16/17 0939  Weight: 128 lb 1.4 oz (58.1 kg)   Physical Exam  Constitutional: She is oriented to person, place, and time and well-developed, well-nourished, and in no distress.  She is walking herself.  Accompanied by husband.  HENT:  Head: Normocephalic and atraumatic.  Mouth/Throat: Oropharynx is clear and moist. No oropharyngeal  exudate.  Eyes: Pupils are equal, round, and reactive to light.  Neck: Normal range of motion. Neck supple.  Cardiovascular: Normal rate and regular rhythm.  Pulmonary/Chest: No respiratory distress. She has no wheezes.  Abdominal: Soft. Bowel sounds are normal. She exhibits no distension and no mass. There is no tenderness. There is no rebound and no guarding.  Musculoskeletal: Normal range of motion. She exhibits no edema or tenderness.  Neurological: She is alert and oriented to person, place, and time.  Skin: Skin is warm.  Psychiatric: Affect normal.     LABORATORY DATA:  I have reviewed the data as listed    Component Value Date/Time   NA 135 12/16/2017 0925   K 3.9 12/16/2017 0925   CL 103 12/16/2017 0925   CO2 24 12/16/2017 0925   GLUCOSE 102 (H) 12/16/2017 0925   BUN 12 12/16/2017 0925   CREATININE 0.70 12/16/2017 0925   CALCIUM 8.9 12/16/2017 0925   PROT 7.0 12/16/2017 0925   ALBUMIN 3.3 (L) 12/16/2017 0925   AST 25 12/16/2017 0925  ALT 20 12/16/2017 0925   ALKPHOS 130 (H) 12/16/2017 0925   BILITOT 0.4 12/16/2017 0925   GFRNONAA >60 12/16/2017 0925   GFRAA >60 12/16/2017 0925    No results found for: SPEP, UPEP  Lab Results  Component Value Date   WBC 3.2 (L) 12/16/2017   NEUTROABS 1.6 12/16/2017   HGB 11.1 (L) 12/16/2017   HCT 32.8 (L) 12/16/2017   MCV 86.4 12/16/2017   PLT 221 12/16/2017      Chemistry      Component Value Date/Time   NA 135 12/16/2017 0925   K 3.9 12/16/2017 0925   CL 103 12/16/2017 0925   CO2 24 12/16/2017 0925   BUN 12 12/16/2017 0925   CREATININE 0.70 12/16/2017 0925      Component Value Date/Time   CALCIUM 8.9 12/16/2017 0925   ALKPHOS 130 (H) 12/16/2017 0925   AST 25 12/16/2017 0925   ALT 20 12/16/2017 0925   BILITOT 0.4 12/16/2017 0925       RADIOGRAPHIC STUDIES: I have personally reviewed the radiological images as listed and agreed with the findings in the report. No results found.   ASSESSMENT & PLAN:   Metastatic melanoma to liver ([place]) #Melanoma-BRAF mutant; metastatic-currently on dabrafenib plus trametinib [jan 26 th2019]; AUG 26th CT chest slightly increased size of the left upper lobe medial nodule by few mm; few new nodules. STABLE.   # Continue current therapy for now- with dabrafenib plus Mekinist.  Repeat CT scan C/A/P prior to next visit.   #Brain metastasis clinically no progression; stable. July 23rd MRI brain stable disease.  Stable  # Back pain/vertebral fracture-significant improvement of pain.  Stable continue Xgeva plus calcium plus vitamin D.  # Hypothyroidism-75 mcg of Synthroid.  Stable.   # follow up in 5month/labs- cbc/cmp/ldh; x-geva; CT C/A/P prior-   Cc; Dr.Colichio/ [facility].    Orders Placed This Encounter  Procedures  . CT CHEST W CONTRAST    Standing Status:   Future    Standing Expiration Date:   12/17/2018    Order Specific Question:   If indicated for the ordered procedure, I authorize the administration of contrast media per Radiology protocol    Answer:   Yes    Order Specific Question:   Preferred imaging location?    Answer:   [facility]    Order Specific Question:   Radiology Contrast Protocol - do NOT remove file path    Answer:   [email or web address]    Order Specific Question:   ** REASON FOR EXAM (FREE TEXT)    Answer:   melanoma mets to liver/lung    Order Specific Question:   Is patient pregnant?    Answer:   No  . CT Abdomen Pelvis W Contrast    Standing Status:   Future    Standing Expiration Date:   12/16/2018    Order Specific Question:   ** REASON FOR EXAM (FREE TEXT)    Answer:   melanoma    Order Specific Question:   If indicated for the ordered procedure, I authorize the administration of contrast media per Radiology protocol    Answer:   Yes    Order Specific Question:   Is patient pregnant?    Answer:   No    Order Specific Question:   Preferred imaging location?    Answer:   [facility]     Order Specific Question:   Is Oral Contrast requested for this exam?    Answer:  Yes, Per Radiology protocol    Order Specific Question:   Radiology Contrast Protocol - do NOT remove file path    Answer:   [email or web address]  . CBC with Differential/Platelet    Standing Status:   Future    Standing Expiration Date:   12/17/2018  . Comprehensive metabolic panel    Standing Status:   Future    Standing Expiration Date:   12/17/2018  . Lactate dehydrogenase    Standing Status:   Future    Standing Expiration Date:   12/17/2018   All questions were answered. The patient knows to call the clinic with any problems, questions or concerns.      Cammie Sickle, MD 12/17/2017 12:57 PM

## 2017-12-16 NOTE — Assessment & Plan Note (Addendum)
#  Melanoma-BRAF mutant; metastatic-currently on dabrafenib plus trametinib [jan 26 th2019]; AUG 26th CT chest slightly increased size of the left upper lobe medial nodule by few mm; few new nodules.  Overall clinically STABLE.   # Continue current therapy for now- with dabrafenib plus Mekinist.  Repeat CT scan C/A/P prior to next visit.  Discussed with Dr[facility]Colichio at [facility]; unfortunately great options available at this time. ? TIL.  Patient declined evaluation at other higher centers like [facility] given the travel.   #Brain metastasis clinically no progression; stable. July 23rd MRI brain stable disease. Stable  # Back pain/vertebral fracture-significant improvement of pain.  Stable continue Xgeva plus calcium plus vitamin D.  # Hypothyroidism-75 mcg of Synthroid.  Stable.   # follow up in 19month/labs- cbc/cmp/ldh; x-geva; CT C/A/P prior-   Cc; Dr.Colichio/ [facility].

## 2017-12-16 NOTE — Patient Instructions (Signed)

## 2017-12-18 ENCOUNTER — Telehealth: Payer: Self-pay | Admitting: Pharmacist

## 2017-12-18 NOTE — Telephone Encounter (Signed)
Oral Chemotherapy Pharmacist Encounter  Follow-Up Form  Called patient today to follow up regarding patient's oral chemotherapy medication: Mekinist/Tafinlar  Original Start date of oral chemotherapy: 03/2017  Pt reports 0 tablets/doses of Tafinlar or Mek missed so far this month.   Pt reports the following side effects: None reported  New medications?: None reported  Other Issues: None reported  Patient knows to call the office with questions or concerns. Oral Oncology Clinic will continue to follow.  Darl Pikes, PharmD, BCPS, [facility] Hematology/Oncology Clinical Pharmacist [facility] [place] [facility] [phone]  12/18/2017 4:20 PM

## 2017-12-26 ENCOUNTER — Telehealth: Payer: Self-pay | Admitting: Internal Medicine

## 2017-12-30 NOTE — Telephone Encounter (Signed)
Spoke to dr.rubinas re: TIL evaluation.

## 2018-01-08 MED FILL — MEKINIST 2 MG TAB: 2 | 30 days supply | Qty: 30 | Fill #4

## 2018-01-08 MED FILL — TAFINLAR 75 MG CAPSULE: 75 | 30 days supply | Qty: 120 | Fill #4

## 2018-01-09 ENCOUNTER — Ambulatory Visit
Admission: RE | Admit: 2018-01-09 | Discharge: 2018-01-09 | Disposition: A | Discharge status: Home / Self Care | Payer: BC Managed Care – PPO | Source: Ambulatory Visit | Attending: Internal Medicine | Admitting: Internal Medicine

## 2018-01-09 ENCOUNTER — Inpatient Hospital Stay: Payer: BC Managed Care – PPO | Attending: Internal Medicine

## 2018-01-09 DIAGNOSIS — C7800 Secondary malignant neoplasm of unspecified lung: Secondary | ICD-10-CM | POA: Diagnosis not present

## 2018-01-09 DIAGNOSIS — E039 Hypothyroidism, unspecified: Secondary | ICD-10-CM | POA: Insufficient documentation

## 2018-01-09 DIAGNOSIS — Z79899 Other long term (current) drug therapy: Secondary | ICD-10-CM | POA: Insufficient documentation

## 2018-01-09 DIAGNOSIS — C787 Secondary malignant neoplasm of liver and intrahepatic bile duct: Secondary | ICD-10-CM

## 2018-01-09 DIAGNOSIS — R918 Other nonspecific abnormal finding of lung field: Secondary | ICD-10-CM | POA: Insufficient documentation

## 2018-01-09 DIAGNOSIS — R21 Rash and other nonspecific skin eruption: Secondary | ICD-10-CM | POA: Insufficient documentation

## 2018-01-09 DIAGNOSIS — Z85850 Personal history of malignant neoplasm of thyroid: Secondary | ICD-10-CM | POA: Diagnosis not present

## 2018-01-09 DIAGNOSIS — C7951 Secondary malignant neoplasm of bone: Secondary | ICD-10-CM | POA: Diagnosis not present

## 2018-01-09 DIAGNOSIS — Z87891 Personal history of nicotine dependence: Secondary | ICD-10-CM | POA: Insufficient documentation

## 2018-01-09 DIAGNOSIS — C439 Malignant melanoma of skin, unspecified: Secondary | ICD-10-CM | POA: Diagnosis not present

## 2018-01-09 DIAGNOSIS — C7931 Secondary malignant neoplasm of brain: Secondary | ICD-10-CM | POA: Diagnosis not present

## 2018-01-09 DIAGNOSIS — I700 Atherosclerosis of aorta: Secondary | ICD-10-CM | POA: Insufficient documentation

## 2018-01-09 LAB — COMPREHENSIVE METABOLIC PANEL
ALK PHOS: 124 U/L (ref 38–126)
ALT: 21 U/L (ref 0–44)
AST: 29 U/L (ref 15–41)
Albumin: 3.5 g/dL (ref 3.5–5.0)
Anion gap: 11 (ref 5–15)
BILIRUBIN TOTAL: 0.5 mg/dL (ref 0.3–1.2)
BUN: 11 mg/dL (ref 6–20)
CALCIUM: 8.5 mg/dL — AB (ref 8.9–10.3)
CO2: 25 mmol/L (ref 22–32)
CREATININE: 0.75 mg/dL (ref 0.44–1.00)
Chloride: 97 mmol/L — ABNORMAL LOW (ref 98–111)
GFR calc non Af Amer: >60 mL/min (ref >60)
Glucose, Bld: 96 mg/dL (ref 70–99)
Potassium: 3.8 mmol/L (ref 3.5–5.1)
SODIUM: 133 mmol/L — AB (ref 135–145)
Total Protein: 7.3 g/dL (ref 6.5–8.1)

## 2018-01-09 LAB — CBC WITH DIFFERENTIAL/PLATELET
Abs Immature Granulocytes: 0.02 10*3/uL (ref 0.00–0.07)
Basophils Absolute: 0 10*3/uL (ref 0.0–0.1)
Basophils Relative: 1 %
EOS ABS: 0.1 10*3/uL (ref 0.0–0.5)
Eosinophils Relative: 3 %
HEMATOCRIT: 32.4 % — AB (ref 36.0–46.0)
Hemoglobin: 10.7 g/dL — ABNORMAL LOW (ref 12.0–15.0)
Immature Granulocytes: 1 %
LYMPHS ABS: 0.9 10*3/uL (ref 0.7–4.0)
Lymphocytes Relative: 24 %
MCH: 28.6 pg (ref 26.0–34.0)
MCHC: 33 g/dL (ref 30.0–36.0)
MCV: 86.6 fL (ref 80.0–100.0)
MONOS PCT: 13 %
Monocytes Absolute: 0.5 10*3/uL (ref 0.1–1.0)
Neutro Abs: 2.2 10*3/uL (ref 1.7–7.7)
Neutrophils Relative %: 58 %
Platelets: 208 10*3/uL (ref 150–400)
RBC: 3.74 MIL/uL — ABNORMAL LOW (ref 3.87–5.11)
RDW: 13.5 % (ref 11.5–15.5)
WBC: 3.8 10*3/uL — ABNORMAL LOW (ref 4.0–10.5)
nRBC: 0 % (ref 0.0–0.2)

## 2018-01-09 LAB — LACTATE DEHYDROGENASE: LDH: 237 U/L — ABNORMAL HIGH (ref 98–192)

## 2018-01-09 MED ORDER — IOHEXOL 300 MG/ML  SOLN
100.0000 mL | Freq: Once | INTRAMUSCULAR | Status: AC | PRN
  Administered 2018-01-09: 100 mL via INTRAVENOUS

## 2018-01-09 MED ORDER — HEPARIN SOD (PORK) LOCK FLUSH 100 UNIT/ML IV SOLN
500.0000 [IU] | Freq: Once | INTRAVENOUS | Status: AC
  Administered 2018-01-09: 500 [IU] via INTRAVENOUS
  Filled 2018-01-09: qty 5

## 2018-01-09 MED ORDER — SODIUM CHLORIDE 0.9% FLUSH
10.0000 mL | INTRAVENOUS | Status: DC | PRN
  Administered 2018-01-09: 10 mL via INTRAVENOUS
  Filled 2018-01-09: qty 10

## 2018-01-13 ENCOUNTER — Inpatient Hospital Stay: Payer: BC Managed Care – PPO

## 2018-01-13 ENCOUNTER — Inpatient Hospital Stay (HOSPITAL_BASED_OUTPATIENT_CLINIC_OR_DEPARTMENT_OTHER): Payer: BC Managed Care – PPO | Admitting: Internal Medicine

## 2018-01-13 VITALS — BP 127/83 | HR 81 | Temp 97.6°F | Resp 16 | Wt 127.0 lb

## 2018-01-13 DIAGNOSIS — C7951 Secondary malignant neoplasm of bone: Secondary | ICD-10-CM | POA: Diagnosis not present

## 2018-01-13 DIAGNOSIS — C7800 Secondary malignant neoplasm of unspecified lung: Secondary | ICD-10-CM

## 2018-01-13 DIAGNOSIS — C787 Secondary malignant neoplasm of liver and intrahepatic bile duct: Secondary | ICD-10-CM | POA: Diagnosis not present

## 2018-01-13 DIAGNOSIS — C7931 Secondary malignant neoplasm of brain: Secondary | ICD-10-CM | POA: Diagnosis not present

## 2018-01-13 DIAGNOSIS — Z87891 Personal history of nicotine dependence: Secondary | ICD-10-CM

## 2018-01-13 DIAGNOSIS — E039 Hypothyroidism, unspecified: Secondary | ICD-10-CM

## 2018-01-13 DIAGNOSIS — Z85850 Personal history of malignant neoplasm of thyroid: Secondary | ICD-10-CM

## 2018-01-13 DIAGNOSIS — C439 Malignant melanoma of skin, unspecified: Secondary | ICD-10-CM

## 2018-01-13 DIAGNOSIS — R21 Rash and other nonspecific skin eruption: Secondary | ICD-10-CM

## 2018-01-13 DIAGNOSIS — Z79899 Other long term (current) drug therapy: Secondary | ICD-10-CM

## 2018-01-13 MED ORDER — DENOSUMAB 120 MG/1.7ML ~~LOC~~ SOLN
120.0000 mg | Freq: Once | SUBCUTANEOUS | Status: AC
  Administered 2018-01-13: 120 mg via SUBCUTANEOUS

## 2018-01-13 NOTE — Assessment & Plan Note (Addendum)
#  Melanoma-BRAF mutant; metastatic-currently on dabrafenib plus trametinib [jan 26 th2019]; OCT 17th CT chest/A/P-mixed response; predominant increase in size of the left upper lobe posterior medial mass approximately 2 cm [in June previously around 1 cm]; subcentimeter lung nodules; stable/improved liver lesions; omental lesions.  Stable  #For now continue current therapy with dabrafenib plus Mekinist. Discussed with Dr.Colichio at [facility] review of slides at [facility] for non-TIL study.  Appointment this afternoon.  #Discussed regarding SBRT of the left upper lobe lesion if not a candidate for clinical trial; will discuss with Dr.Colichio.   #Brain metastasis clinically no progression; stable. July 23rd MRI brain stable disease.  Stable  # Back pain/vertebral fracture-significant improvement of pain.  Calcium 8.5 continue Xgeva plus calcium plus vitamin D.  Stable  #Skin rash-grade 1; likely related to braf/mek-inhibitor.  Recommend hydrocortisone twice daily.  Topical; recommend tinted with dose to avoid sunlight  # Hypothyroidism-75 mcg of Synthroid.  Stable.   #DISPOSITION: Recommend patient call us with an update after the appointment today. # X-geva today; take CD to [facility] today # # follow up in 19month/labs- cbc/cmp/ldh; x-geva;  Cc; Dr.Colichio/ [facility].

## 2018-01-13 NOTE — Progress Notes (Signed)
Patient's Calcium is 8.5. Messaged Dr. B and he would like to give Xgeva.   Larene Beach, PharmD

## 2018-01-13 NOTE — Progress Notes (Signed)
[place] OFFICE PROGRESS NOTE  Patient Care Team: Dion Body, MD as PCP - General (Family Medicine)  Cancer Staging No matching staging information was found for the patient.   Oncology History   # NOV 2018-RECURRENT METASTATIC MELANOMA MULTIPLE LIVER LESIONS/lung lesions [s/p Bx on Nov 30th]; DEc 2018- PET-multiple lung liver bone metastasis.   # Dec 11th,2018- ipi+Nivo s/p 2 cycles;  # Jan 23rd 2019 CT scan- Progression; Jan 2019- MRI brain- improved SBRT lesion; worsening 9 mm left parietal; new 2 mm right parietal; July 23rd MRI-Improved  #April 19, 2017- Dabrafenib+Mekinist; March 24th- significant PR.   # Dec 1st 2018- Brain mets-Right frontal 51m [s/p SBRT- dec 21st]; and 2 other 2-4 mm. March 2019- Improved; AUG 2019- Progression of Lung nodules.   # Bone mets- X-geva [12/04]; plan RT   # Hx of Melanoma- dec 2011 [SLNBx- 0/4-Neg;[facility] ]; Hx of thyroid cancer- feb 2001- s/p left throid lobectomy [[place]]; NO RAIU.  # Molecular testing: Foundation: **BRAF V600E mutation; TMB-H; MSS; --------------------------------------------------------------------------------------    DIAGNOSIS: MELANOMA- B-raf pos  STAGE:   IV   ;GOALS: palliative  CURRENT/MOST RECENT THERAPY- dab+mek     Metastatic melanoma [facility] liver ([facility]      INTERVAL HISTORY:  [name]89y.o.  female pleasant patient above history of metastatic melanoma currently on B-Raf + MEK-inhibitor is here for follow-up/review the results of the CT scan.  Patient notes to have a rash on her upper extremities upper chest for the last 2 weeks.  Non-itchy.  Does not endorse significant sun exposure.  No rash anywhere else.  No fever no chills.  No headaches.  No nausea no vomiting.  Appetite is fair.  No lumps or bumps.   Review of Systems  Constitutional: Positive for malaise/fatigue. Negative for chills, diaphoresis, fever and weight loss.  HENT: Negative for nosebleeds and sore  throat.   Eyes: Negative for double vision.  Respiratory: Negative for cough, hemoptysis, sputum production, shortness of breath and wheezing.   Cardiovascular: Negative for chest pain, palpitations, orthopnea and leg swelling.  Gastrointestinal: Negative for abdominal pain, blood in stool, constipation, diarrhea, heartburn, melena, nausea and vomiting.  Genitourinary: Negative for dysuria, frequency and urgency.  Musculoskeletal: Negative for back pain and joint pain.  Skin: Positive for rash. Negative for itching.  Neurological: Negative for dizziness, tingling, focal weakness, weakness and headaches.  Endo/Heme/Allergies: Does not bruise/bleed easily.  Psychiatric/Behavioral: Negative for depression. The patient is not nervous/anxious and does not have insomnia.       PAST MEDICAL HISTORY :  Past Medical History:  Diagnosis Date  . Hypothyroidism   . Melanoma of skin ([place] 2011   Resected from Right neck area with 4 lymph nodes as well.   . Thyroid cancer ([place] 2001   Partial thyroidectomy    PAST SURGICAL HISTORY :   Past Surgical History:  Procedure Laterality Date  . BREAST BIOPSY Right 2005   neg  . BREAST BIOPSY Left 07/11/2016   radial scar  . BREAST EXCISIONAL BIOPSY Left 08/06/2016   lumpectomy for radial scar  . BREAST LUMPECTOMY WITH NEEDLE LOCALIZATION Left 08/06/2016   Procedure: BREAST LUMPECTOMY WITH NEEDLE LOCALIZATION;  Surgeon: SLeonie Green MD;  Location: [facility] ORS;  Service: General;  Laterality: Left;  .Marland KitchenMELANOMA EXCISION  2011   ear  . PORTA CATH INSERTION N/A 03/14/2017   Procedure: PORTA CATH INSERTION;  Surgeon: SKatha Cabal MD;  [facility];  Service: Cardiovascular;  Laterality: N/A;  . THYROIDECTOMY, PARTIAL Left     FAMILY HISTORY :   Family History  Problem Relation Age of Onset  . Breast cancer Mother 24  . Breast cancer Paternal Aunt   . Colon cancer Brother 42    SOCIAL HISTORY:   Social History    Tobacco Use  . Smoking status: Former Smoker    Packs/day: 0.25    Years: 20.00    Pack years: 5.00    Types: Cigarettes    Last attempt to quit: 03/26/1999    Years since quitting: 18.8  . Smokeless tobacco: Never Used  Substance Use Topics  . Alcohol use: Yes    Comment: WEEKENDS  . Drug use: No    ALLERGIES:  is allergic to tetanus-diphtheria toxoids td.  MEDICATIONS:  Current Outpatient Medications  Medication Sig Dispense Refill  . Calcium Carbonate-Vitamin D (CALCIUM 500/VITAMIN D PO) Take 1 tablet by mouth 2 (two) times daily.    Marland Kitchen dabrafenib mesylate (TAFINLAR) 75 MG capsule Take 2 capsules (150 mg total) by mouth 2 (two) times daily. Take on an empty stomach 1 hour before or 2 hours after meals. 120 capsule 4  . levothyroxine (SYNTHROID) 75 MCG tablet Take 1 tablet (75 mcg total) by mouth daily before breakfast. 30 tablet 6  . lidocaine-prilocaine (EMLA) cream Apply 1 application topically as needed. 30 g 4  . traMADol (ULTRAM) 50 MG tablet Take 1 tablet (50 mg total) by mouth every 6 (six) hours as needed. 60 tablet 0  . trametinib dimethyl sulfoxide (MEKINIST) 2 MG tablet Take 1 tablet (2 mg total) by mouth daily. Take 1 hour before or 2 hours after a meal. Store refrigerated in original container. 30 tablet 4   No current facility-administered medications for this visit.     PHYSICAL EXAMINATION: ECOG PERFORMANCE STATUS: 1 - Symptomatic but completely ambulatory  BP 127/83 (BP Location: Left Arm, Patient Position: Sitting)   Pulse 81   Temp 97.6 F (36.4 C) (Tympanic)   Resp 16   Wt 127 lb (57.6 kg)   LMP 02/29/2012 (Approximate)   BMI 21.80 kg/m   Filed Weights   01/13/18 0920  Weight: 127 lb (57.6 kg)   Physical Exam  Constitutional: She is oriented to person, place, and time and well-developed, well-nourished, and in no distress.  She is walking herself.  Accompanied by husband.  HENT:  Head: Normocephalic and atraumatic.  Mouth/Throat: Oropharynx  is clear and moist. No oropharyngeal exudate.  Eyes: Pupils are equal, round, and reactive to light.  Neck: Normal range of motion. Neck supple.  Cardiovascular: Normal rate and regular rhythm.  Pulmonary/Chest: No respiratory distress. She has no wheezes.  Abdominal: Soft. Bowel sounds are normal. She exhibits no distension and no mass. There is no tenderness. There is no rebound and no guarding.  Musculoskeletal: Normal range of motion. She exhibits no edema or tenderness.  Neurological: She is alert and oriented to person, place, and time.  Skin: Skin is warm.  Maculopapular rash anterior upper torso upper extremities (non-sun exposed areas).   Psychiatric: Affect normal.     LABORATORY DATA:  I have reviewed the data as listed    Component Value Date/Time   NA 133 (L) 01/09/2018 0835   K 3.8 01/09/2018 0835   CL 97 (L) 01/09/2018 0835   CO2 25 01/09/2018 0835   GLUCOSE 96 01/09/2018 0835   BUN 11 01/09/2018 0835   CREATININE 0.75 01/09/2018 0835   CALCIUM 8.5 (L) 01/09/2018  0835   PROT 7.3 01/09/2018 0835   ALBUMIN 3.5 01/09/2018 0835   AST 29 01/09/2018 0835   ALT 21 01/09/2018 0835   ALKPHOS 124 01/09/2018 0835   BILITOT 0.5 01/09/2018 0835   GFRNONAA >60 01/09/2018 0835   GFRAA >60 01/09/2018 0835    No results found for: SPEP, UPEP  Lab Results  Component Value Date   WBC 3.8 (L) 01/09/2018   NEUTROABS 2.2 01/09/2018   HGB 10.7 (L) 01/09/2018   HCT 32.4 (L) 01/09/2018   MCV 86.6 01/09/2018   PLT 208 01/09/2018      Chemistry      Component Value Date/Time   NA 133 (L) 01/09/2018 0835   K 3.8 01/09/2018 0835   CL 97 (L) 01/09/2018 0835   CO2 25 01/09/2018 0835   BUN 11 01/09/2018 0835   CREATININE 0.75 01/09/2018 0835      Component Value Date/Time   CALCIUM 8.5 (L) 01/09/2018 0835   ALKPHOS 124 01/09/2018 0835   AST 29 01/09/2018 0835   ALT 21 01/09/2018 0835   BILITOT 0.5 01/09/2018 0835       RADIOGRAPHIC STUDIES: I have personally  reviewed the radiological images as listed and agreed with the findings in the report. No results found.   ASSESSMENT & PLAN:  Metastatic melanoma to liver ([place]) #Melanoma-BRAF mutant; metastatic-currently on dabrafenib plus trametinib [jan 26 th2019]; OCT 17th CT chest/A/P-mixed response; predominant increase in size of the left upper lobe posterior medial mass approximately 2 cm [in June previously around 1 cm]; subcentimeter lung nodules; stable/improved liver lesions; omental lesions.  Stable  #For now continue current therapy with dabrafenib plus Mekinist. Discussed with Dr[facility]Colichio at [facility] review of slides at [facility] for non-TIL study.  Appointment this afternoon.  #Discussed regarding SBRT of the left upper lobe lesion if not a candidate for clinical trial; will discuss with Dr.Colichio.   #Brain metastasis clinically no progression; stable. July 23rd MRI brain stable disease.  Stable  # Back pain/vertebral fracture-significant improvement of pain.  Calcium 8.5 continue Xgeva plus calcium plus vitamin D.  Stable  #Skin rash-grade 1; likely related to braf/mek-inhibitor.  Recommend hydrocortisone twice daily.  Topical; recommend tinted with dose to avoid sunlight  # Hypothyroidism-75 mcg of Synthroid.  Stable.   #DISPOSITION: Recommend patient call us with an update after the appointment today. # X-geva today; take CD to [facility] today # # follow up in 72month/labs- cbc/cmp/ldh; x-geva;  Cc; Dr.Colichio/ [facility].    No orders of the defined types were placed in this encounter.  All questions were answered. The patient knows to call the clinic with any problems, questions or concerns.      GCammie Sickle MD 01/13/2018 1:24 PM

## 2018-01-21 ENCOUNTER — Other Ambulatory Visit: Payer: Self-pay | Admitting: Internal Medicine

## 2018-01-21 NOTE — Telephone Encounter (Signed)
)     Ref Range & Units 10mo ago  TSH 0.450 - 4.500 uIU/mL 0.680   T4, Total 4.5 - 12.0 ug/dL 7.6   T3 Uptake Ratio 24 - 39 % 26   Free Thyroxine Index 1.2 - 4.9 2.0   Comment: (NOTE)  Performed At: [facility]  [place], [place] [id number]  Rush Farmer MD [id number]  Performed at [facility], [address]  [place], [place] [address]   Resulting Agency  [facility]      Specimen Collected: 08/26/17 09:05 Last Resulted: 08/27/17 03:36

## 2018-02-10 ENCOUNTER — Inpatient Hospital Stay: Payer: BC Managed Care – PPO

## 2018-02-10 ENCOUNTER — Encounter: Payer: Self-pay | Admitting: Internal Medicine

## 2018-02-10 ENCOUNTER — Inpatient Hospital Stay: Payer: BC Managed Care – PPO | Attending: Internal Medicine | Admitting: Internal Medicine

## 2018-02-10 ENCOUNTER — Other Ambulatory Visit: Payer: Self-pay

## 2018-02-10 VITALS — BP 131/79 | HR 96 | Temp 97.4°F | Resp 18 | Ht 64.0 in | Wt 127.9 lb

## 2018-02-10 DIAGNOSIS — E039 Hypothyroidism, unspecified: Secondary | ICD-10-CM | POA: Insufficient documentation

## 2018-02-10 DIAGNOSIS — C7951 Secondary malignant neoplasm of bone: Secondary | ICD-10-CM

## 2018-02-10 DIAGNOSIS — Z87891 Personal history of nicotine dependence: Secondary | ICD-10-CM | POA: Diagnosis not present

## 2018-02-10 DIAGNOSIS — C7931 Secondary malignant neoplasm of brain: Secondary | ICD-10-CM | POA: Insufficient documentation

## 2018-02-10 DIAGNOSIS — C787 Secondary malignant neoplasm of liver and intrahepatic bile duct: Secondary | ICD-10-CM

## 2018-02-10 DIAGNOSIS — C439 Malignant melanoma of skin, unspecified: Secondary | ICD-10-CM | POA: Insufficient documentation

## 2018-02-10 DIAGNOSIS — C7800 Secondary malignant neoplasm of unspecified lung: Secondary | ICD-10-CM | POA: Diagnosis not present

## 2018-02-10 LAB — COMPREHENSIVE METABOLIC PANEL
ALBUMIN: 3.5 g/dL (ref 3.5–5.0)
ALK PHOS: 98 U/L (ref 38–126)
ALT: 19 U/L (ref 0–44)
AST: 27 U/L (ref 15–41)
Anion gap: 10 (ref 5–15)
BILIRUBIN TOTAL: 0.4 mg/dL (ref 0.3–1.2)
BUN: 9 mg/dL (ref 6–20)
CALCIUM: 9.2 mg/dL (ref 8.9–10.3)
CO2: 28 mmol/L (ref 22–32)
CREATININE: 0.86 mg/dL (ref 0.44–1.00)
Chloride: 100 mmol/L (ref 98–111)
GFR calc Af Amer: >60 mL/min (ref >60)
GFR calc non Af Amer: >60 mL/min (ref >60)
GLUCOSE: 90 mg/dL (ref 70–99)
Potassium: 3.8 mmol/L (ref 3.5–5.1)
Sodium: 138 mmol/L (ref 135–145)
TOTAL PROTEIN: 7.1 g/dL (ref 6.5–8.1)

## 2018-02-10 LAB — CBC WITH DIFFERENTIAL/PLATELET
ABS IMMATURE GRANULOCYTES: 0.02 10*3/uL (ref 0.00–0.07)
BASOS ABS: 0 10*3/uL (ref 0.0–0.1)
Basophils Relative: 0 %
EOS ABS: 0.2 10*3/uL (ref 0.0–0.5)
EOS PCT: 4 %
HCT: 33 % — ABNORMAL LOW (ref 36.0–46.0)
HEMOGLOBIN: 10.6 g/dL — AB (ref 12.0–15.0)
Immature Granulocytes: 0 %
LYMPHS ABS: 0.9 10*3/uL (ref 0.7–4.0)
Lymphocytes Relative: 15 %
MCH: 28.6 pg (ref 26.0–34.0)
MCHC: 32.1 g/dL (ref 30.0–36.0)
MCV: 88.9 fL (ref 80.0–100.0)
MONO ABS: 0.6 10*3/uL (ref 0.1–1.0)
MONOS PCT: 9 %
Neutro Abs: 4.2 10*3/uL (ref 1.7–7.7)
Neutrophils Relative %: 72 %
PLATELETS: 255 10*3/uL (ref 150–400)
RBC: 3.71 MIL/uL — AB (ref 3.87–5.11)
RDW: 14.1 % (ref 11.5–15.5)
WBC: 5.9 10*3/uL (ref 4.0–10.5)
nRBC: 0 % (ref 0.0–0.2)

## 2018-02-10 LAB — LACTATE DEHYDROGENASE: LDH: 206 U/L — ABNORMAL HIGH (ref 98–192)

## 2018-02-10 MED ORDER — DENOSUMAB 120 MG/1.7ML ~~LOC~~ SOLN
120.0000 mg | Freq: Once | SUBCUTANEOUS | Status: AC
  Administered 2018-02-10: 120 mg via SUBCUTANEOUS

## 2018-02-10 NOTE — Progress Notes (Signed)
[place] OFFICE PROGRESS NOTE  Patient Care Team: Dion Body, MD as PCP - General (Family Medicine)  Cancer Staging No matching staging information was found for the patient.   Oncology History   # NOV 2018-RECURRENT METASTATIC MELANOMA MULTIPLE LIVER LESIONS/lung lesions [s/p Bx on Nov 30th]; DEc 2018- PET-multiple lung liver bone metastasis.   # Dec 11th,2018- ipi+Nivo s/p 2 cycles;  # Jan 23rd 2019 CT scan- Progression; Jan 2019- MRI brain- improved SBRT lesion; worsening 9 mm left parietal; new 2 mm right parietal; July 23rd MRI-Improved  #April 19, 2017- Dabrafenib+Mekinist; March 24th- significant PR.   # Dec 1st 2018- Brain mets-Right frontal 62m [s/p SBRT- dec 21st]; and 2 other 2-4 mm. March 2019- Improved; AUG 2019- Progression of Lung nodules.   # SBRT LUL nodule [[facility]; % fx- finished 02/13/2018 ]  # Bone mets- X-geva [12/04]; s/p RT [Dr.Crystal;dec 2017]  # Hx of Melanoma- dec 2011 [SLNBx- 0/4-Neg;[facility] ]; Hx of thyroid cancer- feb 2001- s/p left throid lobectomy [[place]]; NO RAIU.  # Tanya Vega: Foundation: **BRAF V600E mutation; TMB-H; MSS; ----------------------------------------------------------------------  DIAGNOSIS: MELANOMA- B-raf pos  STAGE:   IV   ;GOALS: palliative  CURRENT/MOST RECENT THERAPY- dab+mek     Metastatic melanoma [facility] liver ([facility]      INTERVAL HISTORY:  Tanya Dimiceli89y.o.  female pleasant patient above history of metastatic melanoma currently on B-Raf + MEK-inhibitor is here for follow-up.   [facility].  She is currently on SBRT-5 fractions finishing up this Friday [November, 22nd]. Patient is currently off dabrafenib+ Meknisit-for her radiation.  Denies any headaches.  Denies any nausea vomiting.  Denies any rash.  Mild fatigue.  No pain.  Review of Systems  Constitutional: Positive for malaise/fatigue. Negative for chills, diaphoresis,  fever and weight loss.  HENT: Negative for nosebleeds and sore throat.   Eyes: Negative for double vision.  Respiratory: Negative for cough, hemoptysis, sputum production, shortness of breath and wheezing.   Cardiovascular: Negative for chest pain, palpitations, orthopnea and leg swelling.  Gastrointestinal: Negative for abdominal pain, blood in stool, constipation, diarrhea, heartburn, melena, nausea and vomiting.  Genitourinary: Negative for dysuria, frequency and urgency.  Musculoskeletal: Negative for back pain and joint pain.  Skin: Negative for itching.  Neurological: Negative for dizziness, tingling, focal weakness, weakness and headaches.  Endo/Heme/Allergies: Does not bruise/bleed easily.  Psychiatric/Behavioral: Negative for depression. The patient is not nervous/anxious and does not have insomnia.       PAST MEDICAL HISTORY :  Past Medical History:  Diagnosis Date  . Hypothyroidism   . [place] [place]   Resected from Right neck area with 4 lymph nodes as well.   . [place] cancer ([place] 2001   Partial thyroidectomy    PAST SURGICAL HISTORY :   Past Surgical History:  Procedure Laterality Date  . BREAST BIOPSY Right 2005   neg  . BREAST BIOPSY Left 07/11/2016   radial scar  . BREAST EXCISIONAL BIOPSY Left 08/06/2016   lumpectomy for radial scar  . BREAST LUMPECTOMY WITH NEEDLE LOCALIZATION Left 08/06/2016   Procedure: BREAST LUMPECTOMY WITH NEEDLE LOCALIZATION;  Surgeon: SLeonie Green MD;  Location: [facility] ORS;  Service: General;  Laterality: Left;  .Marland KitchenMELANOMA EXCISION  2011   ear  . PORTA CATH INSERTION N/A 03/14/2017   Procedure: PORTA CATH INSERTION;  Surgeon: SKatha Cabal MD;  [facility];  Service: Cardiovascular;  Laterality: N/A;  .  THYROIDECTOMY, PARTIAL Left     FAMILY HISTORY :   Family History  Problem Relation Age of Onset  . Breast cancer Mother 56  . Breast cancer Paternal Aunt   . Colon cancer Brother 4     SOCIAL HISTORY:   Social History   Tobacco Use  . Smoking status: Former Smoker    Packs/day: 0.25    Years: 20.00    Pack years: 5.00    Types: Cigarettes    Last attempt to quit: 03/26/1999    Years since quitting: 18.8  . Smokeless tobacco: Never Used  Substance Use Topics  . Alcohol use: Yes    Comment: WEEKENDS  . Drug use: No    ALLERGIES:  is allergic to tetanus-diphtheria toxoids td.  MEDICATIONS:  Current Outpatient Medications  Medication Sig Dispense Refill  . Calcium Carbonate-Vitamin D (CALCIUM 500/VITAMIN D PO) Take 1 tablet by mouth 2 (two) times daily.    Marland Kitchen levothyroxine (SYNTHROID, LEVOTHROID) 75 MCG tablet TAKE 1 TABLET (75 MCG TOTAL) BY MOUTH DAILY BEFORE BREAKFAST. 90 tablet 2  . lidocaine-prilocaine (EMLA) cream Apply 1 application topically as needed. 30 g 4  . traMADol (ULTRAM) 50 MG tablet Take 1 tablet (50 mg total) by mouth every 6 (six) hours as needed. 60 tablet 0  . dabrafenib mesylate (TAFINLAR) 75 MG capsule Take 2 capsules (150 mg total) by mouth 2 (two) times daily. Take on an empty stomach 1 hour before or 2 hours after meals. (Patient not taking: Reported on 02/10/2018) 120 capsule 4  . trametinib dimethyl sulfoxide (MEKINIST) 2 MG tablet Take 1 tablet (2 mg total) by mouth daily. Take 1 hour before or 2 hours after a meal. Store refrigerated in original container. (Patient not taking: Reported on 02/10/2018) 30 tablet 4   No current facility-administered medications for this visit.     PHYSICAL EXAMINATION: ECOG PERFORMANCE STATUS: 1 - Symptomatic but completely ambulatory  BP 131/79 (BP Location: Left Arm, Patient Position: Sitting)   Pulse 96   Temp (!) 97.4 F (36.3 C) (Tympanic)   Resp 18   Ht '5\' 4"'  (1.626 m)   Wt 127 lb 13.9 oz (58 kg)   LMP 02/29/2012 (Approximate)   BMI 21.95 kg/m   Filed Weights   02/10/18 1325  Weight: 127 lb 13.9 oz (58 kg)   Physical Exam  Constitutional: She is oriented to person, place, and time  and well-developed, well-nourished, and in no distress.  She is walking herself.  Accompanied by husband.  HENT:  Head: Normocephalic and atraumatic.  Mouth/Throat: Oropharynx is clear and moist. No oropharyngeal exudate.  Eyes: Pupils are equal, round, and reactive to light.  Neck: Normal range of motion. Neck supple.  Cardiovascular: Normal rate and regular rhythm.  Pulmonary/Chest: No respiratory distress. She has no wheezes.  Abdominal: Soft. Bowel sounds are normal. She exhibits no distension and no mass. There is no tenderness. There is no rebound and no guarding.  Musculoskeletal: Normal range of motion. She exhibits no edema or tenderness.  Neurological: She is alert and oriented to person, place, and time.  Skin: Skin is warm.  Psychiatric: Affect normal.     LABORATORY DATA:  I have reviewed the data as listed    Component Value Date/Time   NA 138 02/10/2018 1319   K 3.8 02/10/2018 1319   CL 100 02/10/2018 1319   CO2 28 02/10/2018 1319   GLUCOSE 90 02/10/2018 1319   BUN 9 02/10/2018 1319   CREATININE 0.86 02/10/2018  1319   CALCIUM 9.2 02/10/2018 1319   PROT 7.1 02/10/2018 1319   ALBUMIN 3.5 02/10/2018 1319   AST 27 02/10/2018 1319   ALT 19 02/10/2018 1319   ALKPHOS 98 02/10/2018 1319   BILITOT 0.4 02/10/2018 1319   GFRNONAA >60 02/10/2018 1319   GFRAA >60 02/10/2018 1319    No results found for: SPEP, UPEP  Lab Results  Component Value Date   WBC 5.9 02/10/2018   NEUTROABS 4.2 02/10/2018   HGB 10.6 (L) 02/10/2018   HCT 33.0 (L) 02/10/2018   MCV 88.9 02/10/2018   PLT 255 02/10/2018      Chemistry      Component Value Date/Time   NA 138 02/10/2018 1319   K 3.8 02/10/2018 1319   CL 100 02/10/2018 1319   CO2 28 02/10/2018 1319   BUN 9 02/10/2018 1319   CREATININE 0.86 02/10/2018 1319      Component Value Date/Time   CALCIUM 9.2 02/10/2018 1319   ALKPHOS 98 02/10/2018 1319   AST 27 02/10/2018 1319   ALT 19 02/10/2018 1319   BILITOT 0.4 02/10/2018  1319       RADIOGRAPHIC STUDIES: I have personally reviewed the radiological images as listed and agreed with the findings in the report. No results found.   ASSESSMENT & PLAN:  Metastatic melanoma to liver ([place]) #Melanoma-BRAF mutant; metastatic-currently on dabrafenib plus trametinib [jan 26 th2019]; OCT 17th CT chest/A/P-mixed response; predominant increase in size of the left upper lobe posterior medial mass approximately 2 cm [in June previously around 1 cm]; subcentimeter lung nodules; stable/improved liver lesions; omental lesions.  Stable.  #Left upper lobe posterior medial lung nodule-SBRT x5 fractions [facility] November 22].  Discussed that we will plan to get a CT scan approximately 2 months-to reevaluate the status of disease.  Will order CT at next visit.  #Patient's dabrafenib plus Mekinist-is currently on hold because of radiation.  Patient recommended to start dabrafenib plus Mekinist starting November 25.  #Brain metastasis clinically no progression; stable.  November 2019 MRI [facility] improving.  # Back pain/vertebral fracture-stable.  Continue Xgeva plus calcium plus vitamin D.  # Hypothyroidism-75 mcg of Synthroid. Stable.   #DISPOSITION: # X-geva today.  # follow up in 35month/labs- cbc/cmp/ldh; x-geva- Dr.B  Cc; Dr.Colichio/ [facility].    Orders Placed This Encounter  Procedures  . CBC with Differential/Platelet    Standing Status:   Future    Standing Expiration Date:   02/11/2019  . Comprehensive metabolic panel    Standing Status:   Future    Standing Expiration Date:   02/11/2019  . Lactate dehydrogenase    Standing Status:   Future    Standing Expiration Date:   02/11/2019   All questions were answered. The patient knows to call the clinic with any problems, questions or concerns.      GCammie Sickle MD 02/10/2018 2:26 PM

## 2018-02-10 NOTE — Patient Instructions (Signed)

## 2018-02-10 NOTE — Assessment & Plan Note (Addendum)
#  Melanoma-BRAF mutant; metastatic-currently on dabrafenib plus trametinib [jan 26 th2019]; OCT 17th CT chest/A/P-mixed response; predominant increase in size of the left upper lobe posterior medial mass approximately 2 cm [in June previously around 1 cm]; subcentimeter lung nodules; stable/improved liver lesions; omental lesions.  Stable.  #Left upper lobe posterior medial lung nodule-SBRT x5 fractions [facility] November 22].  Discussed that we will plan to get a CT scan approximately 2 months-to reevaluate the status of disease.  Will order CT at next visit.  #Patient's dabrafenib plus Mekinist-is currently on hold because of radiation.  Patient recommended to start dabrafenib plus Mekinist starting November 25.  #Brain metastasis clinically no progression; stable.  November 2019 MRI [facility] improving.  # Back pain/vertebral fracture-stable.  Continue Xgeva plus calcium plus vitamin D.  # Hypothyroidism-75 mcg of Synthroid. Stable.   #DISPOSITION: # X-geva today.  # follow up in 46month/labs- cbc/cmp/ldh; x-geva- Dr.B  Cc; Dr.Colichio/ [facility].

## 2018-02-17 ENCOUNTER — Other Ambulatory Visit: Payer: Self-pay | Admitting: Internal Medicine

## 2018-02-17 DIAGNOSIS — C787 Secondary malignant neoplasm of liver and intrahepatic bile duct: Secondary | ICD-10-CM

## 2018-02-23 MED FILL — TAFINLAR 75 MG CAPSULE: 75 | 30 days supply | Qty: 120 | Fill #0

## 2018-02-23 MED FILL — MEKINIST 2 MG TAB: 2 | 30 days supply | Qty: 30 | Fill #0

## 2018-03-10 ENCOUNTER — Encounter: Payer: Self-pay | Admitting: Internal Medicine

## 2018-03-10 ENCOUNTER — Inpatient Hospital Stay: Payer: BC Managed Care – PPO

## 2018-03-10 ENCOUNTER — Inpatient Hospital Stay: Payer: BC Managed Care – PPO | Attending: Internal Medicine | Admitting: Internal Medicine

## 2018-03-10 VITALS — BP 118/77 | HR 93 | Temp 97.4°F | Wt 129.4 lb

## 2018-03-10 DIAGNOSIS — Z87891 Personal history of nicotine dependence: Secondary | ICD-10-CM | POA: Diagnosis not present

## 2018-03-10 DIAGNOSIS — C439 Malignant melanoma of skin, unspecified: Secondary | ICD-10-CM | POA: Diagnosis not present

## 2018-03-10 DIAGNOSIS — C787 Secondary malignant neoplasm of liver and intrahepatic bile duct: Secondary | ICD-10-CM

## 2018-03-10 DIAGNOSIS — M545 Low back pain: Secondary | ICD-10-CM | POA: Diagnosis not present

## 2018-03-10 DIAGNOSIS — C7800 Secondary malignant neoplasm of unspecified lung: Secondary | ICD-10-CM | POA: Diagnosis not present

## 2018-03-10 DIAGNOSIS — R5383 Other fatigue: Secondary | ICD-10-CM

## 2018-03-10 DIAGNOSIS — C7951 Secondary malignant neoplasm of bone: Secondary | ICD-10-CM | POA: Diagnosis not present

## 2018-03-10 DIAGNOSIS — E039 Hypothyroidism, unspecified: Secondary | ICD-10-CM | POA: Insufficient documentation

## 2018-03-10 DIAGNOSIS — C7931 Secondary malignant neoplasm of brain: Secondary | ICD-10-CM | POA: Insufficient documentation

## 2018-03-10 LAB — URINALYSIS, COMPLETE (UACMP) WITH MICROSCOPIC
Bacteria, UA: NONE SEEN
Bilirubin Urine: NEGATIVE
Glucose, UA: NEGATIVE mg/dL
Hgb urine dipstick: NEGATIVE
KETONES UR: NEGATIVE mg/dL
LEUKOCYTES UA: NEGATIVE
Nitrite: NEGATIVE
Protein, ur: NEGATIVE mg/dL
Specific Gravity, Urine: 1.01 (ref 1.005–1.030)
WBC, UA: NONE SEEN WBC/hpf (ref 0–5)
pH: 6.5 (ref 5.0–8.0)

## 2018-03-10 LAB — CBC WITH DIFFERENTIAL/PLATELET
ABS IMMATURE GRANULOCYTES: 0.03 10*3/uL (ref 0.00–0.07)
Basophils Absolute: 0 10*3/uL (ref 0.0–0.1)
Basophils Relative: 1 %
Eosinophils Absolute: 0.1 10*3/uL (ref 0.0–0.5)
Eosinophils Relative: 2 %
HEMATOCRIT: 30.6 % — AB (ref 36.0–46.0)
HEMOGLOBIN: 10 g/dL — AB (ref 12.0–15.0)
Immature Granulocytes: 1 %
LYMPHS PCT: 17 %
Lymphs Abs: 0.8 10*3/uL (ref 0.7–4.0)
MCH: 28.4 pg (ref 26.0–34.0)
MCHC: 32.7 g/dL (ref 30.0–36.0)
MCV: 86.9 fL (ref 80.0–100.0)
MONO ABS: 0.5 10*3/uL (ref 0.1–1.0)
MONOS PCT: 10 %
NEUTROS ABS: 3.4 10*3/uL (ref 1.7–7.7)
Neutrophils Relative %: 69 %
Platelets: 279 10*3/uL (ref 150–400)
RBC: 3.52 MIL/uL — ABNORMAL LOW (ref 3.87–5.11)
RDW: 14.3 % (ref 11.5–15.5)
WBC: 4.9 10*3/uL (ref 4.0–10.5)
nRBC: 0 % (ref 0.0–0.2)

## 2018-03-10 LAB — COMPREHENSIVE METABOLIC PANEL
ALBUMIN: 2.9 g/dL — AB (ref 3.5–5.0)
ALT: 17 U/L (ref 0–44)
AST: 28 U/L (ref 15–41)
Alkaline Phosphatase: 115 U/L (ref 38–126)
Anion gap: 7 (ref 5–15)
BILIRUBIN TOTAL: 0.5 mg/dL (ref 0.3–1.2)
BUN: 14 mg/dL (ref 6–20)
CHLORIDE: 97 mmol/L — AB (ref 98–111)
CO2: 28 mmol/L (ref 22–32)
Calcium: 8.4 mg/dL — ABNORMAL LOW (ref 8.9–10.3)
Creatinine, Ser: 0.72 mg/dL (ref 0.44–1.00)
GFR calc Af Amer: >60 mL/min (ref >60)
GFR calc non Af Amer: >60 mL/min (ref >60)
GLUCOSE: 116 mg/dL — AB (ref 70–99)
POTASSIUM: 3.5 mmol/L (ref 3.5–5.1)
Sodium: 132 mmol/L — ABNORMAL LOW (ref 135–145)
TOTAL PROTEIN: 6.6 g/dL (ref 6.5–8.1)

## 2018-03-10 LAB — LACTATE DEHYDROGENASE: LDH: 208 U/L — ABNORMAL HIGH (ref 98–192)

## 2018-03-10 MED ORDER — SODIUM CHLORIDE 0.9% FLUSH
10.0000 mL | INTRAVENOUS | Status: DC | PRN
  Administered 2018-03-10: 10 mL via INTRAVENOUS
  Filled 2018-03-10: qty 10

## 2018-03-10 MED ORDER — HEPARIN SOD (PORK) LOCK FLUSH 100 UNIT/ML IV SOLN
500.0000 [IU] | Freq: Once | INTRAVENOUS | Status: AC
  Administered 2018-03-10: 500 [IU] via INTRAVENOUS

## 2018-03-10 MED ORDER — DENOSUMAB 120 MG/1.7ML ~~LOC~~ SOLN
120.0000 mg | Freq: Once | SUBCUTANEOUS | Status: AC
  Administered 2018-03-10: 120 mg via SUBCUTANEOUS

## 2018-03-10 NOTE — Assessment & Plan Note (Addendum)
#  Melanoma-BRAF mutant; metastatic-currently on dabrafenib plus trametinib [jan 26 th2019]; OCT 17th CT chest/A/P-mixed response; predominant increase in size of the left upper lobe posterior medial mass approximately 2 cm [in June previously around 1 cm]; subcentimeter lung nodules; stable/improved liver lesions; omental lesions.  Overall clinically stable.  #Continue dabrafenib plus trametinib at this time.  Tolerating well.  Labs reviewed-except for low albumin [low discussion below].  We will plan to get CT chest and pelvis in 1 month.  Ordered today.  # Left upper lobe posterior medial lung nodule-SBRT x5 fractions [[facility] finished 22nd Nov 2019].  Clinically stable.  #Brain metastasis clinically no progression; November 2019 MRI [facility] improving.  Clinically stable.  # Back pain/vertebral fracture-- Continue Xgeva plus calcium plus vitamin D.  Stable.  Calcium 8.3.  # Hypothyroidism-75 mcg of Synthroid.  Stable  #Low albumin 2.9-no evidence of obvious malnutrition.  Recommend check a UA.   #DISPOSITION: # X-geva today. Check UA today.  # follow up in 47month/labs- cbc/cmp/ldh; x-geva; CT chest/a/p- Dr.B  Cc; Dr.Colichio/ [facility].

## 2018-03-10 NOTE — Progress Notes (Signed)
[place] OFFICE PROGRESS NOTE  Patient Care Team: Dion Body, MD as PCP - General (Family Medicine)  Cancer Staging No matching staging information was found for the patient.   Oncology History   # NOV 2018-RECURRENT METASTATIC MELANOMA MULTIPLE LIVER LESIONS/lung lesions [s/p Bx on Nov 30th]; DEc 2018- PET-multiple lung liver bone metastasis.   # Dec 11th,2018- ipi+Nivo s/p 2 cycles;  # Jan 23rd 2019 CT scan- Progression; Jan 2019- MRI brain- improved SBRT lesion; worsening 9 mm left parietal; new 2 mm right parietal; July 23rd MRI-Improved  #April 19, 2017- Dabrafenib+Mekinist; March 24th- significant PR.   # Dec 1st 2018- Brain mets-Right frontal 71m [s/p SBRT- dec 21st]; and 2 other 2-4 mm. March 2019- Improved; AUG 2019- Progression of Lung nodules.   # SBRT LUL nodule [[facility]; % fx- finished 02/13/2018 ]  # Bone mets- X-geva [12/04]; s/p RT [Dr.Crystal;dec 2017]  # Hx of Melanoma- dec 2011 [SLNBx- 0/4-Neg;[facility] ]; Hx of thyroid cancer- feb 2001- s/p left throid lobectomy [[place]]; NO RAIU.  # Molecular testing: Foundation: **BRAF V600E mutation; TMB-H; MSS; ----------------------------------------------------------------------  DIAGNOSIS: MELANOMA- B-raf pos  STAGE:   IV   ;GOALS: palliative  CURRENT/MOST RECENT THERAPY- dab+mek     Metastatic melanoma to liver ([facility]      INTERVAL HISTORY:  Tanya Smoots52y.o.  female pleasant patient above history of metastatic melanoma currently on B-Raf + MEK-inhibitor is here for follow-up.   Patient finished her SBRT 5 fractions on November 22.  She currently started back on dabrafenib plus Mekinist post radiation.  Complains of mild fatigue.  Denies any nausea vomiting or weight loss.  Denies any swelling in the legs.  Denies any dysuria.  No back pain.  No jaw pain.  No headaches.  Review of Systems  Constitutional: Positive for malaise/fatigue. Negative for chills, diaphoresis, fever and  weight loss.  HENT: Negative for nosebleeds and sore throat.   Eyes: Negative for double vision.  Respiratory: Negative for cough, hemoptysis, sputum production, shortness of breath and wheezing.   Cardiovascular: Negative for chest pain, palpitations, orthopnea and leg swelling.  Gastrointestinal: Negative for abdominal pain, blood in stool, constipation, diarrhea, heartburn, melena, nausea and vomiting.  Genitourinary: Negative for dysuria, frequency and urgency.  Musculoskeletal: Negative for back pain and joint pain.  Skin: Negative for itching.  Neurological: Negative for dizziness, tingling, focal weakness, weakness and headaches.  Endo/Heme/Allergies: Does not bruise/bleed easily.  Psychiatric/Behavioral: Negative for depression. The patient is not nervous/anxious and does not have insomnia.       PAST MEDICAL HISTORY :  Past Medical History:  Diagnosis Date  . Hypothyroidism   . Melan[place] of [place] ([place]   Resected from Right neck area with 4 lymph nodes as well.   . [place] ([place]   Partial thyroidectomy    PAST SURGICAL HISTORY :   Past Surgical History:  Procedure Laterality Date  . BREAST BIOPSY Right 2005   neg  . BREAST BIOPSY Left 07/11/2016   radial scar  . BREAST EXCISIONAL BIOPSY Left 08/06/2016   lumpectomy for radial scar  . BREAST LUMPECTOMY WITH NEEDLE LOCALIZATION Left 08/06/2016   Procedure: BREAST LUMPECTOMY WITH NEEDLE LOCALIZATION;  Surgeon: SLeonie Green MD;  Location: [facility] ORS;  Service: General;  Laterality: Left;  .Marland KitchenMELANOMA EXCISION  2011   ear  . PORTA CATH INSERTION N/A 03/14/2017   Procedure: PORTA CATH INSERTION;  Surgeon: SKatha Cabal MD;  [facility];  Service: Cardiovascular;  Laterality: N/A;  . THYROIDECTOMY, PARTIAL Left     FAMILY HISTORY :   Family History  Problem Relation Age of Onset  . Breast cancer Mother 63  . Breast cancer Paternal Aunt   . Colon cancer Brother 2     SOCIAL HISTORY:   Social History   Tobacco Use  . Smoking status: Former Smoker    Packs/day: 0.25    Years: 20.00    Pack years: 5.00    Types: Cigarettes    Last attempt to quit: 03/26/1999    Years since quitting: 18.9  . Smokeless tobacco: Never Used  Substance Use Topics  . Alcohol use: Yes    Comment: WEEKENDS  . Drug use: No    ALLERGIES:  is allergic to tetanus-diphtheria toxoids td.  MEDICATIONS:  Current Outpatient Medications  Medication Sig Dispense Refill  . Calcium Carbonate-Vitamin D (CALCIUM 500/VITAMIN D PO) Take 1 tablet by mouth 2 (two) times daily.    Marland Kitchen levothyroxine (SYNTHROID, LEVOTHROID) 75 MCG tablet TAKE 1 TABLET (75 MCG TOTAL) BY MOUTH DAILY BEFORE BREAKFAST. 90 tablet 2  . lidocaine-prilocaine (EMLA) cream Apply 1 application topically as needed. 30 g 4  . MEKINIST 2 MG tablet TAKE 1 TABLET (2 MG TOTAL) BY MOUTH DAILY. TAKE 1 HOUR BEFORE OR 2 HOURS AFTER A MEAL. STORE REFRIGERATED IN ORIGINAL CONTAINER. 30 tablet 4  . TAFINLAR 75 MG capsule TAKE 2 CAPSULES (150 MG TOTAL) BY MOUTH 2 (TWO) TIMES DAILY. TAKE ON AN EMPTY STOMACH 1 HOUR BEFORE OR 2 HOURS AFTER MEALS. 120 capsule 4  . traMADol (ULTRAM) 50 MG tablet Take 1 tablet (50 mg total) by mouth every 6 (six) hours as needed. 60 tablet 0   No current facility-administered medications for this visit.    Facility-Administered Medications Ordered in Other Visits  Medication Dose Route Frequency Provider Last Rate Last Dose  . denosumab (XGEVA) injection 120 mg  120 mg Subcutaneous Once Charlaine Dalton R, MD      . heparin lock flush 100 unit/mL  500 Units Intravenous Once Charlaine Dalton R, MD      . sodium chloride flush (NS) 0.9 % injection 10 mL  10 mL Intravenous PRN Cammie Sickle, MD   10 mL at 03/10/18 1026    PHYSICAL EXAMINATION: ECOG PERFORMANCE STATUS: 1 - Symptomatic but completely ambulatory  BP 118/77 (BP Location: Left Arm, Patient Position: Sitting)   Pulse 93    Temp (!) 97.4 F (36.3 C) (Oral)   Wt 129 lb 6.6 oz (58.7 kg)   LMP 02/29/2012 (Approximate)   BMI 22.21 kg/m   Filed Weights   03/10/18 1043  Weight: 129 lb 6.6 oz (58.7 kg)   Physical Exam  Constitutional: She is oriented to person, place, and time and well-developed, well-nourished, and in no distress.  She is walking herself.  Accompanied by husband.  HENT:  Head: Normocephalic and atraumatic.  Mouth/Throat: Oropharynx is clear and moist. No oropharyngeal exudate.  Eyes: Pupils are equal, round, and reactive to light.  Neck: Normal range of motion. Neck supple.  Cardiovascular: Normal rate and regular rhythm.  Pulmonary/Chest: No respiratory distress. She has no wheezes.  Abdominal: Soft. Bowel sounds are normal. She exhibits no distension and no mass. There is no abdominal tenderness. There is no rebound and no guarding.  Musculoskeletal: Normal range of motion.        General: No tenderness or edema.  Neurological: She is alert and oriented to person, place, and  time.  Skin: Skin is warm.  Psychiatric: Affect normal.     LABORATORY DATA:  I have reviewed the data as listed    Component Value Date/Time   NA 132 (L) 03/10/2018 1032   K 3.5 03/10/2018 1032   CL 97 (L) 03/10/2018 1032   CO2 28 03/10/2018 1032   GLUCOSE 116 (H) 03/10/2018 1032   BUN 14 03/10/2018 1032   CREATININE 0.72 03/10/2018 1032   CALCIUM 8.4 (L) 03/10/2018 1032   PROT 6.6 03/10/2018 1032   ALBUMIN 2.9 (L) 03/10/2018 1032   AST 28 03/10/2018 1032   ALT 17 03/10/2018 1032   ALKPHOS 115 03/10/2018 1032   BILITOT 0.5 03/10/2018 1032   GFRNONAA >60 03/10/2018 1032   GFRAA >60 03/10/2018 1032    No results found for: SPEP, UPEP  Lab Results  Component Value Date   WBC 4.9 03/10/2018   NEUTROABS 3.4 03/10/2018   HGB 10.0 (L) 03/10/2018   HCT 30.6 (L) 03/10/2018   MCV 86.9 03/10/2018   PLT 279 03/10/2018      Chemistry      Component Value Date/Time   NA 132 (L) 03/10/2018 1032   K  3.5 03/10/2018 1032   CL 97 (L) 03/10/2018 1032   CO2 28 03/10/2018 1032   BUN 14 03/10/2018 1032   CREATININE 0.72 03/10/2018 1032      Component Value Date/Time   CALCIUM 8.4 (L) 03/10/2018 1032   ALKPHOS 115 03/10/2018 1032   AST 28 03/10/2018 1032   ALT 17 03/10/2018 1032   BILITOT 0.5 03/10/2018 1032       RADIOGRAPHIC STUDIES: I have personally reviewed the radiological images as listed and agreed with the findings in the report. No results found.   ASSESSMENT & PLAN:  Metastatic melanoma to liver ([place]) #Melanoma-BRAF mutant; metastatic-currently on dabrafenib plus trametinib [jan 26 th2019]; OCT 17th CT chest/A/P-mixed response; predominant increase in size of the left upper lobe posterior medial mass approximately 2 cm [in June previously around 1 cm]; subcentimeter lung nodules; stable/improved liver lesions; omental lesions.  Overall clinically stable.  #Continue dabrafenib plus trametinib at this time.  Tolerating well.  Labs reviewed-except for low albumin [low discussion below].  We will plan to get CT chest and pelvis in 1 month.  Ordered today.  # Left upper lobe posterior medial lung nodule-SBRT x5 fractions [[facility] finished 22nd Nov 2019].  Clinically stable.  #Brain metastasis clinically no progression; November 2019 MRI [facility] improving.  Clinically stable.  # Back pain/vertebral fracture-- Continue Xgeva plus calcium plus vitamin D.  Stable.  Calcium 8.3.  # Hypothyroidism-75 mcg of Synthroid.  Stable  #Low albumin 2.9-no evidence of obvious malnutrition.  Recommend check a UA.   #DISPOSITION: # X-geva today. Check UA today.  # follow up in 55month/labs- cbc/cmp/ldh; x-geva; CT chest/a/p- Dr.B  Cc; Dr.Colichio/ [facility].    Orders Placed This Encounter  Procedures  . CT CHEST W CONTRAST    Standing Status:   Future    Standing Expiration Date:   03/11/2019    Order Specific Question:   If indicated for the ordered procedure, I authorize the administration of  contrast media per Radiology protocol    Answer:   Yes    Order Specific Question:   Preferred imaging location?    Answer:   [facility]    Order Specific Question:   Radiology Contrast Protocol - do NOT remove file path    Answer:   \\[email or web address]    Order  Specific Question:   ** REASON FOR EXAM (FREE TEXT)    Answer:   metatstic melanoma    Order Specific Question:   Is patient pregnant?    Answer:   No  . CT Abdomen Pelvis W Contrast    Standing Status:   Future    Standing Expiration Date:   03/10/2019    Order Specific Question:   ** REASON FOR EXAM (FREE TEXT)    Answer:   metastatic melanoma    Order Specific Question:   If indicated for the ordered procedure, I authorize the administration of contrast media per Radiology protocol    Answer:   Yes    Order Specific Question:   Is patient pregnant?    Answer:   No    Order Specific Question:   Preferred imaging location?    Answer:   [facility]    Order Specific Question:   Is Oral Contrast requested for this exam?    Answer:   Yes, Per Radiology protocol    Order Specific Question:   Radiology Contrast Protocol - do NOT remove file path    Answer:   \\[email or web address]  . Urinalysis, Complete w Microscopic    Standing Status:   Future    Number of Occurrences:   1    Standing Expiration Date:   03/11/2019   All questions were answered. The patient knows to call the clinic with any problems, questions or concerns.      Cammie Sickle, MD 03/10/2018 11:26 AM

## 2018-03-17 ENCOUNTER — Telehealth: Payer: Self-pay | Admitting: Pharmacy Technician

## 2018-03-17 ENCOUNTER — Encounter: Payer: Self-pay | Admitting: Pharmacist

## 2018-03-17 NOTE — Telephone Encounter (Signed)
Oral Oncology Patient Advocate Encounter  Received fax notification from [employer] that prior authorization for [place] is required.  PA's completed and faxed to [phone] Status is pending  Oral Oncology Clinic will continue to follow.  [place] Patient [place] Phone [phone] Fax [phone] 03/17/2018 9:17 AM

## 2018-03-19 MED FILL — MEKINIST 2 MG TAB: 2 | 30 days supply | Qty: 30 | Fill #1

## 2018-03-19 MED FILL — TAFINLAR 75 MG CAPSULE: 75 | 30 days supply | Qty: 120 | Fill #1

## 2018-03-19 NOTE — Telephone Encounter (Signed)
Oral Oncology Patient Advocate Encounter  Prior Authorization for [employer] [place] has been approved.    PA# [id number](BEQUHKIS) [id number] (Mekinist) Effective dates: 03/17/18 through 03/18/19  Patients co-pay is $0.00 as of 03/19/18.  Patient has copay cards to help with any copay she may have in January.  Oral Oncology Clinic will continue to follow.   [place] Patient [place] Phone [phone] Fax [phone] 03/19/2018 8:34 AM

## 2018-04-03 ENCOUNTER — Ambulatory Visit
Admission: RE | Admit: 2018-04-03 | Discharge: 2018-04-03 | Disposition: A | Discharge status: Home / Self Care | Payer: BC Managed Care – PPO | Source: Ambulatory Visit | Attending: Internal Medicine | Admitting: Internal Medicine

## 2018-04-03 DIAGNOSIS — C787 Secondary malignant neoplasm of liver and intrahepatic bile duct: Secondary | ICD-10-CM | POA: Insufficient documentation

## 2018-04-03 MED ORDER — IOHEXOL 300 MG/ML  SOLN
100.0000 mL | Freq: Once | INTRAMUSCULAR | Status: AC | PRN
  Administered 2018-04-03: 100 mL via INTRAVENOUS

## 2018-04-07 ENCOUNTER — Inpatient Hospital Stay: Payer: BC Managed Care – PPO | Attending: Internal Medicine | Admitting: Internal Medicine

## 2018-04-07 ENCOUNTER — Encounter: Payer: Self-pay | Admitting: Internal Medicine

## 2018-04-07 ENCOUNTER — Inpatient Hospital Stay: Payer: BC Managed Care – PPO

## 2018-04-07 VITALS — BP 147/86 | HR 79 | Temp 98.3°F | Resp 16 | Wt 128.7 lb

## 2018-04-07 DIAGNOSIS — Z87891 Personal history of nicotine dependence: Secondary | ICD-10-CM

## 2018-04-07 DIAGNOSIS — C7951 Secondary malignant neoplasm of bone: Secondary | ICD-10-CM

## 2018-04-07 DIAGNOSIS — C787 Secondary malignant neoplasm of liver and intrahepatic bile duct: Secondary | ICD-10-CM

## 2018-04-07 DIAGNOSIS — Z95828 Presence of other vascular implants and grafts: Secondary | ICD-10-CM

## 2018-04-07 DIAGNOSIS — C7800 Secondary malignant neoplasm of unspecified lung: Secondary | ICD-10-CM

## 2018-04-07 DIAGNOSIS — C439 Malignant melanoma of skin, unspecified: Secondary | ICD-10-CM | POA: Diagnosis not present

## 2018-04-07 DIAGNOSIS — E039 Hypothyroidism, unspecified: Secondary | ICD-10-CM

## 2018-04-07 DIAGNOSIS — C7931 Secondary malignant neoplasm of brain: Secondary | ICD-10-CM | POA: Diagnosis not present

## 2018-04-07 DIAGNOSIS — Z85850 Personal history of malignant neoplasm of thyroid: Secondary | ICD-10-CM

## 2018-04-07 LAB — CBC WITH DIFFERENTIAL/PLATELET
Abs Immature Granulocytes: 0.02 10*3/uL (ref 0.00–0.07)
Basophils Absolute: 0 10*3/uL (ref 0.0–0.1)
Basophils Relative: 1 %
EOS ABS: 0.2 10*3/uL (ref 0.0–0.5)
Eosinophils Relative: 5 %
HCT: 30.1 % — ABNORMAL LOW (ref 36.0–46.0)
Hemoglobin: 10 g/dL — ABNORMAL LOW (ref 12.0–15.0)
Immature Granulocytes: 0 %
Lymphocytes Relative: 23 %
Lymphs Abs: 1.1 10*3/uL (ref 0.7–4.0)
MCH: 29.1 pg (ref 26.0–34.0)
MCHC: 33.2 g/dL (ref 30.0–36.0)
MCV: 87.5 fL (ref 80.0–100.0)
Monocytes Absolute: 0.5 10*3/uL (ref 0.1–1.0)
Monocytes Relative: 11 %
Neutro Abs: 2.8 10*3/uL (ref 1.7–7.7)
Neutrophils Relative %: 60 %
Platelets: 194 10*3/uL (ref 150–400)
RBC: 3.44 MIL/uL — ABNORMAL LOW (ref 3.87–5.11)
RDW: 14.8 % (ref 11.5–15.5)
WBC: 4.6 10*3/uL (ref 4.0–10.5)
nRBC: 0 % (ref 0.0–0.2)

## 2018-04-07 LAB — COMPREHENSIVE METABOLIC PANEL
ALK PHOS: 101 U/L (ref 38–126)
ALT: 15 U/L (ref 0–44)
AST: 23 U/L (ref 15–41)
Albumin: 3.4 g/dL — ABNORMAL LOW (ref 3.5–5.0)
Anion gap: 9 (ref 5–15)
BUN: 14 mg/dL (ref 6–20)
CALCIUM: 9.3 mg/dL (ref 8.9–10.3)
CO2: 26 mmol/L (ref 22–32)
Chloride: 102 mmol/L (ref 98–111)
Creatinine, Ser: 0.67 mg/dL (ref 0.44–1.00)
GFR calc non Af Amer: >60 mL/min (ref >60)
Glucose, Bld: 102 mg/dL — ABNORMAL HIGH (ref 70–99)
Potassium: 3.9 mmol/L (ref 3.5–5.1)
Sodium: 137 mmol/L (ref 135–145)
Total Bilirubin: 0.5 mg/dL (ref 0.3–1.2)
Total Protein: 7.1 g/dL (ref 6.5–8.1)

## 2018-04-07 LAB — LACTATE DEHYDROGENASE: LDH: 220 U/L — ABNORMAL HIGH (ref 98–192)

## 2018-04-07 MED ORDER — DENOSUMAB 120 MG/1.7ML ~~LOC~~ SOLN
120.0000 mg | Freq: Once | SUBCUTANEOUS | Status: AC
  Administered 2018-04-07: 120 mg via SUBCUTANEOUS

## 2018-04-07 MED ORDER — HEPARIN SOD (PORK) LOCK FLUSH 100 UNIT/ML IV SOLN
500.0000 [IU] | Freq: Once | INTRAVENOUS | Status: AC
  Administered 2018-04-07: 500 [IU] via INTRAVENOUS

## 2018-04-07 MED ORDER — SODIUM CHLORIDE 0.9% FLUSH
10.0000 mL | INTRAVENOUS | Status: DC | PRN
  Administered 2018-04-07: 10 mL via INTRAVENOUS
  Filled 2018-04-07: qty 10

## 2018-04-07 NOTE — Assessment & Plan Note (Addendum)
#  Recurrent metastatic melanoma BRAF POSITIVE- multiple metastatic lesions to the liver; bilateral lung nodules; L1 vertebral compression fracture.   # Currently on II line therapy with B-raf inhibitor + MEK inhibitor- Dabrafenib+ Tremetinib [started jan 26th 2019]; April 03, 2018 CT scan-overall stable disease; however few new subcentimeter nodules noted in the lung [except for 1 new right lower lobe 1.6 cm met]; and also slight increase in the liver lesion currently about 1.5 cm.  #Given the "overall stability" of the disease/good tolerance to therapy/and absence of any good further treatment options at this time-I recommend continued current therapy with close monitoring.  Patient agreement.  # Brain metastases-right frontal status post SBRT; MRI November 2019 [[facility]]-lesion smaller; other lesions have gotten smaller [suggestive of response to targeted therapy].  Stable.  #Bone mets-continue Xgeva.  Tolerating well.  Calcium 9.4.  Stable.  #DISPOSITION: # infusion today # Follow up in 4 weeks-MD labs/x-geva-cbc/cmp/ldh-/burlingtin- Dr.B  # I reviewed the blood work- with the patient in detail; also reviewed the imaging independently [as summarized above]; and with the patient in detail.   Cc; Dr.Collichio

## 2018-04-07 NOTE — Addendum Note (Signed)
Addended by: Sandria Bales B on: 04/07/2018 02:56 PM   Modules accepted: Orders

## 2018-04-07 NOTE — Progress Notes (Signed)
[place] OFFICE PROGRESS NOTE  Patient Care Team: Tanya Body, MD as PCP - General (Family Medicine)  Cancer Staging No matching staging information was found for the patient.   Oncology History   # NOV 2018-RECURRENT METASTATIC MELANOMA MULTIPLE LIVER LESIONS/lung lesions [s/p Bx on Nov 30th]; DEc 2018- PET-multiple lung liver bone metastasis.   # Dec 11th,2018- ipi+Nivo s/p 2 cycles;  # Jan 23rd 2019 CT scan- Progression; Jan 2019- MRI brain- improved SBRT lesion; worsening 9 mm left parietal; new 2 mm right parietal; July 23rd MRI-Improved  #April 19, 2017- Dabrafenib+Mekinist; March 24th- significant PR.   # Dec 1st 2018- Brain mets-Right frontal 63m [s/p SBRT- dec 21st]; and 2 other 2-4 mm. March 2019- Improved; AUG 2019- Progression of Lung nodules.   # SBRT LUL nodule [[facility]; % fx- finished 02/13/2018 ]  # Bone mets- X-geva [12/04]; s/p RT [Dr.Crystal;dec 2017]  # Hx of Melanoma- dec 2011 [SLNBx- 0/4-Neg;[facility] ]; Hx of thyroid cancer- feb 2001- s/p left throid lobectomy [[place]]; NO RAIU.  # Molecular testing: Foundation: **BRAF V600E mutation; TMB-H; MSS; -------------------------------------------------------------------  DIAGNOSIS: MELANOMA- B-raf pos  STAGE:   IV   ;GOALS: palliative  CURRENT/MOST RECENT THERAPY- dab+mek     Metastatic melanoma to liver ([facility]   INTERVAL HISTORY:  Tanya Scheunemann89y.o.  female pleasant patient above history of  metastatic melanoma-brain and liver-currently on dabrafenib + Tremetinib [jan 26th] is here for follow-up/review the results of the CT scan.  Patient denies any new lumps or bumps.  Appetite is good.  No fevers or chills.  No nausea no vomiting.  No headaches.  No weight loss.  Review of Systems  Constitutional: Positive for malaise/fatigue. Negative for chills, diaphoresis, fever and weight loss.  HENT: Negative for nosebleeds and sore throat.   Eyes: Negative for double vision.   Respiratory: Negative for cough, hemoptysis, sputum production, shortness of breath and wheezing.   Cardiovascular: Negative for chest pain, palpitations, orthopnea and leg swelling.  Gastrointestinal: Negative for abdominal pain, blood in stool, constipation, diarrhea, heartburn, melena, nausea and vomiting.  Genitourinary: Negative for dysuria, frequency and urgency.  Musculoskeletal: Negative for back pain and joint pain.  Skin: Negative.  Negative for itching and rash.  Neurological: Negative for dizziness, tingling, focal weakness, weakness and headaches.  Endo/Heme/Allergies: Does not bruise/bleed easily.  Psychiatric/Behavioral: Negative for depression. The patient is not nervous/anxious and does not have insomnia.      PAST MEDICAL HISTORY :  Past Medical History:  Diagnosis Date  . Hypothyroidism   . Melanoma of skin ([facility] 2011   Resected from Right neck area with 4 lymph nodes as well.   . [place] ([facility] [place]   Partial thyroidectomy    PAST SURGICAL HISTORY :   Past Surgical History:  Procedure Laterality Date  . BREAST BIOPSY Right 2005   neg  . BREAST BIOPSY Left 07/11/2016   radial scar  . BREAST EXCISIONAL BIOPSY Left 08/06/2016   lumpectomy for radial scar  . BREAST LUMPECTOMY WITH NEEDLE LOCALIZATION Left 08/06/2016   Procedure: BREAST LUMPECTOMY WITH NEEDLE LOCALIZATION;  Surgeon: SLeonie Green MD;  Location: [facility] ORS;  Service: General;  Laterality: Left;  .Marland KitchenMELANOMA EXCISION  2011   ear  . PORTA CATH INSERTION N/A 03/14/2017   Procedure: PORTA CATH INSERTION;  Surgeon: SKatha Cabal MD;  [facility];  Service: Cardiovascular;  Laterality: N/A;  . THYROIDECTOMY, PARTIAL Left     FAMILY HISTORY :  Family History  Problem Relation Age of Onset  . Breast cancer Mother 21  . Breast cancer Paternal Aunt   . Colon cancer Brother 26    SOCIAL HISTORY:   Social History   Tobacco Use  . Smoking status: Former Smoker     Packs/day: 0.25    Years: 20.00    Pack years: 5.00    Types: Cigarettes    Last attempt to quit: 03/26/1999    Years since quitting: 19.0  . Smokeless tobacco: Never Used  Substance Use Topics  . Alcohol use: Yes    Comment: WEEKENDS  . Drug use: No    ALLERGIES:  is allergic to tetanus-diphtheria toxoids td.  MEDICATIONS:  Current Outpatient Medications  Medication Sig Dispense Refill  . Calcium Carbonate-Vitamin D (CALCIUM 500/VITAMIN D PO) Take 1 tablet by mouth 2 (two) times daily.    Marland Kitchen levothyroxine (SYNTHROID, LEVOTHROID) 75 MCG tablet TAKE 1 TABLET (75 MCG TOTAL) BY MOUTH DAILY BEFORE BREAKFAST. 90 tablet 2  . lidocaine-prilocaine (EMLA) cream Apply 1 application topically as needed. 30 g 4  . MEKINIST 2 MG tablet TAKE 1 TABLET (2 MG TOTAL) BY MOUTH DAILY. TAKE 1 HOUR BEFORE OR 2 HOURS AFTER A MEAL. STORE REFRIGERATED IN ORIGINAL CONTAINER. 30 tablet 4  . TAFINLAR 75 MG capsule TAKE 2 CAPSULES (150 MG TOTAL) BY MOUTH 2 (TWO) TIMES DAILY. TAKE ON AN EMPTY STOMACH 1 HOUR BEFORE OR 2 HOURS AFTER MEALS. 120 capsule 4  . traMADol (ULTRAM) 50 MG tablet Take 1 tablet (50 mg total) by mouth every 6 (six) hours as needed. (Patient not taking: Reported on 04/07/2018) 60 tablet 0   No current facility-administered medications for this visit.    Facility-Administered Medications Ordered in Other Visits  Medication Dose Route Frequency Provider Last Rate Last Dose  . sodium chloride flush (NS) 0.9 % injection 10 mL  10 mL Intravenous PRN Cammie Sickle, MD   10 mL at 04/07/18 1346    PHYSICAL EXAMINATION: ECOG PERFORMANCE STATUS: 0 - Asymptomatic  BP (!) 147/86 (BP Location: Left Arm, Patient Position: Sitting, Cuff Size: Normal)   Pulse 79   Temp 98.3 F (36.8 C) (Tympanic)   Resp 16   Wt 128 lb 12 oz (58.4 kg)   LMP 02/29/2012 (Approximate)   BMI 22.10 kg/m   Filed Weights   04/07/18 1353  Weight: 128 lb 12 oz (58.4 kg)   Physical Exam  Constitutional: She is  oriented to person, place, and time and well-developed, well-nourished, and in no distress.  She is accompanied by her husband.  Walking by herself.  HENT:  Head: Normocephalic and atraumatic.  Mouth/Throat: Oropharynx is clear and moist. No oropharyngeal exudate.  Eyes: Pupils are equal, round, and reactive to light.  Neck: Normal range of motion. Neck supple.  Cardiovascular: Normal rate and regular rhythm.  Pulmonary/Chest: Breath sounds normal. No respiratory distress. She has no wheezes.  Abdominal: Soft. Bowel sounds are normal. She exhibits no distension and no mass. There is no abdominal tenderness. There is no rebound and no guarding.  Musculoskeletal: Normal range of motion.        General: No tenderness or edema.  Neurological: She is alert and oriented to person, place, and time.  Skin: Skin is warm.  Psychiatric: Affect normal.     LABORATORY DATA:  I have reviewed the data as listed    Component Value Date/Time   NA 137 04/07/2018 1332   K 3.9 04/07/2018 1332  CL 102 04/07/2018 1332   CO2 26 04/07/2018 1332   GLUCOSE 102 (H) 04/07/2018 1332   BUN 14 04/07/2018 1332   CREATININE 0.67 04/07/2018 1332   CALCIUM 9.3 04/07/2018 1332   PROT 7.1 04/07/2018 1332   ALBUMIN 3.4 (L) 04/07/2018 1332   AST 23 04/07/2018 1332   ALT 15 04/07/2018 1332   ALKPHOS 101 04/07/2018 1332   BILITOT 0.5 04/07/2018 1332   GFRNONAA >60 04/07/2018 1332   GFRAA >60 04/07/2018 1332    No results found for: SPEP, UPEP  Lab Results  Component Value Date   WBC 4.6 04/07/2018   NEUTROABS 2.8 04/07/2018   HGB 10.0 (L) 04/07/2018   HCT 30.1 (L) 04/07/2018   MCV 87.5 04/07/2018   PLT 194 04/07/2018      Chemistry      Component Value Date/Time   NA 137 04/07/2018 1332   K 3.9 04/07/2018 1332   CL 102 04/07/2018 1332   CO2 26 04/07/2018 1332   BUN 14 04/07/2018 1332   CREATININE 0.67 04/07/2018 1332      Component Value Date/Time   CALCIUM 9.3 04/07/2018 1332   ALKPHOS 101  04/07/2018 1332   AST 23 04/07/2018 1332   ALT 15 04/07/2018 1332   BILITOT 0.5 04/07/2018 1332       RADIOGRAPHIC STUDIES: I have personally reviewed the radiological images as listed and agreed with the findings in the report. No results found.   ASSESSMENT & PLAN:  Metastatic melanoma to liver ([place]) # Recurrent metastatic melanoma BRAF POSITIVE- multiple metastatic lesions to the liver; bilateral lung nodules; L1 vertebral compression fracture.   # Currently on II line therapy with B-raf inhibitor + MEK inhibitor- Dabrafenib+ Tremetinib [started jan 26th 2019]; April 03, 2018 CT scan-overall stable disease; however few new subcentimeter nodules noted in the lung [except for 1 new right lower lobe 1.6 cm met]; and also slight increase in the liver lesion currently about 1.5 cm.  #Given the "overall stability" of the disease/good tolerance to therapy/and absence of any good further treatment options at this time-I recommend continued current therapy with close monitoring.  Patient agreement.  # Brain metastases-right frontal status post SBRT; MRI November 2019 [[facility]]-lesion smaller; other lesions have gotten smaller [suggestive of response to targeted therapy].  Stable.  #Bone mets-continue Xgeva.  Tolerating well.  Calcium 9.4.  Stable.  #DISPOSITION: # infusion today # Follow up in 4 weeks-MD labs/x-geva-cbc/cmp/ldh-/burlingtin- Dr.B  # I reviewed the blood work- with the patient in detail; also reviewed the imaging independently [as summarized above]; and with the patient in detail.   Cc; Dr.Collichio     Orders Placed This Encounter  Procedures  . Comprehensive metabolic panel    Standing Status:   Standing    Number of Occurrences:   20    Standing Expiration Date:   04/08/2019  . CBC with Differential    Standing Status:   Standing    Number of Occurrences:   20    Standing Expiration Date:   04/08/2019  . Lactate dehydrogenase    Standing Status:   Standing     Number of Occurrences:   20    Standing Expiration Date:   04/08/2019   All questions were answered. The patient knows to call the clinic with any problems, questions or concerns.      Cammie Sickle, MD 04/07/2018 2:32 PM

## 2018-04-20 MED FILL — MEKINIST 2 MG TAB: 2 | 30 days supply | Qty: 30 | Fill #2

## 2018-04-20 MED FILL — TAFINLAR 75 MG CAPSULE: 75 | 30 days supply | Qty: 120 | Fill #2

## 2018-05-05 ENCOUNTER — Other Ambulatory Visit: Payer: BC Managed Care – PPO

## 2018-05-05 ENCOUNTER — Inpatient Hospital Stay: Payer: BC Managed Care – PPO

## 2018-05-05 ENCOUNTER — Inpatient Hospital Stay: Payer: BC Managed Care – PPO | Attending: Internal Medicine | Admitting: Internal Medicine

## 2018-05-05 VITALS — BP 154/77 | HR 98 | Temp 97.6°F | Resp 16 | Wt 128.0 lb

## 2018-05-05 DIAGNOSIS — E039 Hypothyroidism, unspecified: Secondary | ICD-10-CM | POA: Diagnosis not present

## 2018-05-05 DIAGNOSIS — C7951 Secondary malignant neoplasm of bone: Secondary | ICD-10-CM | POA: Diagnosis not present

## 2018-05-05 DIAGNOSIS — C7931 Secondary malignant neoplasm of brain: Secondary | ICD-10-CM | POA: Insufficient documentation

## 2018-05-05 DIAGNOSIS — C439 Malignant melanoma of skin, unspecified: Secondary | ICD-10-CM | POA: Diagnosis not present

## 2018-05-05 DIAGNOSIS — C787 Secondary malignant neoplasm of liver and intrahepatic bile duct: Secondary | ICD-10-CM

## 2018-05-05 DIAGNOSIS — Z79899 Other long term (current) drug therapy: Secondary | ICD-10-CM | POA: Insufficient documentation

## 2018-05-05 DIAGNOSIS — Z85850 Personal history of malignant neoplasm of thyroid: Secondary | ICD-10-CM | POA: Insufficient documentation

## 2018-05-05 DIAGNOSIS — Z87891 Personal history of nicotine dependence: Secondary | ICD-10-CM | POA: Insufficient documentation

## 2018-05-05 LAB — COMPREHENSIVE METABOLIC PANEL
ALT: 15 U/L (ref 0–44)
AST: 26 U/L (ref 15–41)
Albumin: 3.4 g/dL — ABNORMAL LOW (ref 3.5–5.0)
Alkaline Phosphatase: 117 U/L (ref 38–126)
Anion gap: 7 (ref 5–15)
BUN: 14 mg/dL (ref 6–20)
CHLORIDE: 102 mmol/L (ref 98–111)
CO2: 26 mmol/L (ref 22–32)
Calcium: 9.3 mg/dL (ref 8.9–10.3)
Creatinine, Ser: 0.68 mg/dL (ref 0.44–1.00)
GFR calc Af Amer: >60 mL/min (ref >60)
GFR calc non Af Amer: >60 mL/min (ref >60)
Glucose, Bld: 107 mg/dL — ABNORMAL HIGH (ref 70–99)
Potassium: 4 mmol/L (ref 3.5–5.1)
Sodium: 135 mmol/L (ref 135–145)
Total Bilirubin: 0.5 mg/dL (ref 0.3–1.2)
Total Protein: 7.2 g/dL (ref 6.5–8.1)

## 2018-05-05 LAB — CBC WITH DIFFERENTIAL/PLATELET
Abs Immature Granulocytes: 0.02 10*3/uL (ref 0.00–0.07)
BASOS PCT: 1 %
Basophils Absolute: 0 10*3/uL (ref 0.0–0.1)
EOS ABS: 0.1 10*3/uL (ref 0.0–0.5)
Eosinophils Relative: 2 %
HCT: 32 % — ABNORMAL LOW (ref 36.0–46.0)
Hemoglobin: 10.3 g/dL — ABNORMAL LOW (ref 12.0–15.0)
Immature Granulocytes: 1 %
Lymphocytes Relative: 25 %
Lymphs Abs: 0.9 10*3/uL (ref 0.7–4.0)
MCH: 28.1 pg (ref 26.0–34.0)
MCHC: 32.2 g/dL (ref 30.0–36.0)
MCV: 87.2 fL (ref 80.0–100.0)
MONOS PCT: 12 %
Monocytes Absolute: 0.4 10*3/uL (ref 0.1–1.0)
Neutro Abs: 2.2 10*3/uL (ref 1.7–7.7)
Neutrophils Relative %: 59 %
Platelets: 242 10*3/uL (ref 150–400)
RBC: 3.67 MIL/uL — ABNORMAL LOW (ref 3.87–5.11)
RDW: 14.4 % (ref 11.5–15.5)
WBC: 3.7 10*3/uL — ABNORMAL LOW (ref 4.0–10.5)
nRBC: 0 % (ref 0.0–0.2)

## 2018-05-05 LAB — LACTATE DEHYDROGENASE: LDH: 343 U/L — ABNORMAL HIGH (ref 98–192)

## 2018-05-05 MED ORDER — DENOSUMAB 120 MG/1.7ML ~~LOC~~ SOLN
120.0000 mg | Freq: Once | SUBCUTANEOUS | Status: AC
  Administered 2018-05-05: 120 mg via SUBCUTANEOUS
  Filled 2018-05-05: qty 1.7

## 2018-05-05 NOTE — Progress Notes (Signed)
[name] OFFICE PROGRESS NOTE  Patient Care Team: Dion Body, MD as PCP - General (Family Medicine)  Cancer Staging No matching staging information was found for the patient.   Oncology History   # NOV 2018-RECURRENT METASTATIC MELANOMA MULTIPLE LIVER LESIONS/lung lesions [s/p Bx on Nov 30th]; DEc 2018- PET-multiple lung liver bone metastasis.   # Dec 11th,2018- ipi+Nivo s/p 2 cycles;  # Jan 23rd 2019 CT scan- Progression; Jan 2019- MRI brain- improved SBRT lesion; worsening 9 mm left parietal; new 2 mm right parietal; July 23rd MRI-Improved  #April 19, 2017- Dabrafenib+Mekinist; March 24th- significant PR.   # Dec 1st 2018- Brain mets-Right frontal 62m [s/p SBRT- dec 21st]; and 2 other 2-4 mm. March 2019- Improved; AUG 2019- Progression of Lung nodules.   # SBRT LUL nodule [[facility]; % fx- finished 02/13/2018 ]  # Bone mets- X-geva [12/04]; s/p RT [Dr.Crystal;dec 2017]  # Hx of Melanoma- dec 2011 [SLNBx- 0/4-Neg;[facility] ]; Hx of thyroid cancer- feb 2001- s/p left throid lobectomy [[place]]; NO RAIU.  # Molecular testing: Foundation: **BRAF V600E mutation; TMB-H; MSS; -------------------------------------------------------------------  DIAGNOSIS: MELANOMA- B-raf pos  STAGE:   IV   ;GOALS: palliative  CURRENT/MOST RECENT THERAPY- dab+mek     Metastatic melanoma to liver ([facility]   INTERVAL HISTORY:  [name]89y.o.  female pleasant patient above history of  metastatic melanoma-brain and liver-currently on dabrafenib + Tremetinib [jan 26th 2019] is here for follow-up.  Patient's appetite is good.  Denies any nausea vomiting no fevers or chills.  Mild fatigue.  No weight loss.  No headaches.  No shortness of breath no cough.  Review of Systems  Constitutional: Positive for malaise/fatigue. Negative for chills, diaphoresis, fever and weight loss.  HENT: Negative for nosebleeds and sore throat.   Eyes: Negative for double vision.  Respiratory:  Negative for cough, hemoptysis, sputum production, shortness of breath and wheezing.   Cardiovascular: Negative for chest pain, palpitations, orthopnea and leg swelling.  Gastrointestinal: Negative for abdominal pain, blood in stool, constipation, diarrhea, heartburn, melena, nausea and vomiting.  Genitourinary: Negative for dysuria, frequency and urgency.  Musculoskeletal: Negative for back pain and joint pain.  Skin: Negative.  Negative for itching and rash.  Neurological: Negative for dizziness, tingling, focal weakness, weakness and headaches.  Endo/Heme/Allergies: Does not bruise/bleed easily.  Psychiatric/Behavioral: Negative for depression. The patient is not nervous/anxious and does not have insomnia.      PAST MEDICAL HISTORY :  Past Medical History:  Diagnosis Date  . Hypothyroidism   . [place]   Resected from Right neck area with 4 lymph nodes as well.   . [place]   Partial thyroidectomy    PAST SURGICAL HISTORY :   Past Surgical History:  Procedure Laterality Date  . BREAST BIOPSY Right 2005   neg  . BREAST BIOPSY Left 07/11/2016   radial scar  . BREAST EXCISIONAL BIOPSY Left 08/06/2016   lumpectomy for radial scar  . BREAST LUMPECTOMY WITH NEEDLE LOCALIZATION Left 08/06/2016   Procedure: BREAST LUMPECTOMY WITH NEEDLE LOCALIZATION;  Surgeon: SLeonie Green MD;  Location: [facility] ORS;  Service: General;  Laterality: Left;  .Marland KitchenMELANOMA EXCISION  2011   ear  . PORTA CATH INSERTION N/A 03/14/2017   Procedure: PORTA CATH INSERTION;  Surgeon: SKatha Cabal MD;  [facility];  Service: Cardiovascular;  Laterality: N/A;  . THYROIDECTOMY, PARTIAL Left     FAMILY HISTORY :   Family History  Problem Relation Age of Onset  . Breast cancer Mother 50  . Breast cancer Paternal Aunt   . Colon cancer Brother 57    SOCIAL HISTORY:   Social History   Tobacco Use  . Smoking status: Former Smoker    Packs/day:  0.25    Years: 20.00    Pack years: 5.00    Types: Cigarettes    Last attempt to quit: 03/26/1999    Years since quitting: 19.1  . Smokeless tobacco: Never Used  Substance Use Topics  . Alcohol use: Yes    Comment: WEEKENDS  . Drug use: No    ALLERGIES:  is allergic to tetanus-diphtheria toxoids td.  MEDICATIONS:  Current Outpatient Medications  Medication Sig Dispense Refill  . Calcium Carbonate-Vitamin D (CALCIUM 500/VITAMIN D PO) Take 1 tablet by mouth 2 (two) times daily.    Marland Kitchen levothyroxine (SYNTHROID, LEVOTHROID) 75 MCG tablet TAKE 1 TABLET (75 MCG TOTAL) BY MOUTH DAILY BEFORE BREAKFAST. 90 tablet 2  . lidocaine-prilocaine (EMLA) cream Apply 1 application topically as needed. 30 g 4  . MEKINIST 2 MG tablet TAKE 1 TABLET (2 MG TOTAL) BY MOUTH DAILY. TAKE 1 HOUR BEFORE OR 2 HOURS AFTER A MEAL. STORE REFRIGERATED IN ORIGINAL CONTAINER. 30 tablet 4  . TAFINLAR 75 MG capsule TAKE 2 CAPSULES (150 MG TOTAL) BY MOUTH 2 (TWO) TIMES DAILY. TAKE ON AN EMPTY STOMACH 1 HOUR BEFORE OR 2 HOURS AFTER MEALS. 120 capsule 4  . traMADol (ULTRAM) 50 MG tablet Take 1 tablet (50 mg total) by mouth every 6 (six) hours as needed. 60 tablet 0   No current facility-administered medications for this visit.     PHYSICAL EXAMINATION: ECOG PERFORMANCE STATUS: 0 - Asymptomatic  BP (!) 154/77 (BP Location: Left Arm, Patient Position: Sitting, Cuff Size: Normal)   Pulse 98   Temp 97.6 F (36.4 C) (Tympanic)   Resp 16   Wt 128 lb (58.1 kg)   LMP 02/29/2012 (Approximate)   BMI 21.97 kg/m   Filed Weights   05/05/18 1454  Weight: 128 lb (58.1 kg)   Physical Exam  Constitutional: She is oriented to person, place, and time and well-developed, well-nourished, and in no distress.  She is accompanied by her husband.  Walking by herself.  HENT:  Head: Normocephalic and atraumatic.  Mouth/Throat: Oropharynx is clear and moist. No oropharyngeal exudate.  Eyes: Pupils are equal, round, and reactive to light.   Neck: Normal range of motion. Neck supple.  Cardiovascular: Normal rate and regular rhythm.  Pulmonary/Chest: Breath sounds normal. No respiratory distress. She has no wheezes.  Abdominal: Soft. Bowel sounds are normal. She exhibits no distension and no mass. There is no abdominal tenderness. There is no rebound and no guarding.  Musculoskeletal: Normal range of motion.        General: No tenderness or edema.  Neurological: She is alert and oriented to person, place, and time.  Skin: Skin is warm.  Psychiatric: Affect normal.     LABORATORY DATA:  I have reviewed the data as listed    Component Value Date/Time   NA 135 05/05/2018 1353   K 4.0 05/05/2018 1353   CL 102 05/05/2018 1353   CO2 26 05/05/2018 1353   GLUCOSE 107 (H) 05/05/2018 1353   BUN 14 05/05/2018 1353   CREATININE 0.68 05/05/2018 1353   CALCIUM 9.3 05/05/2018 1353   PROT 7.2 05/05/2018 1353   ALBUMIN 3.4 (L) 05/05/2018 1353   AST 26 05/05/2018 1353   ALT 15  05/05/2018 1353   ALKPHOS 117 05/05/2018 1353   BILITOT 0.5 05/05/2018 1353   GFRNONAA >60 05/05/2018 1353   GFRAA >60 05/05/2018 1353    No results found for: SPEP, UPEP  Lab Results  Component Value Date   WBC 3.7 (L) 05/05/2018   NEUTROABS 2.2 05/05/2018   HGB 10.3 (L) 05/05/2018   HCT 32.0 (L) 05/05/2018   MCV 87.2 05/05/2018   PLT 242 05/05/2018      Chemistry      Component Value Date/Time   NA 135 05/05/2018 1353   K 4.0 05/05/2018 1353   CL 102 05/05/2018 1353   CO2 26 05/05/2018 1353   BUN 14 05/05/2018 1353   CREATININE 0.68 05/05/2018 1353      Component Value Date/Time   CALCIUM 9.3 05/05/2018 1353   ALKPHOS 117 05/05/2018 1353   AST 26 05/05/2018 1353   ALT 15 05/05/2018 1353   BILITOT 0.5 05/05/2018 1353       RADIOGRAPHIC STUDIES: I have personally reviewed the radiological images as listed and agreed with the findings in the report. No results found.   ASSESSMENT & PLAN:  Metastatic melanoma to liver ([place]) #  Recurrent metastatic melanoma BRAF POSITIVE- multiple metastatic lesions to the liver; bilateral lung nodules; L1 vertebral compression fracture.  On DAB+MEK [jan 2019].   # April 03, 2018 CT scan-overall stable disease; however few new subcentimeter nodules noted in the lung [except for 1 new right lower lobe 1.6 cm met]; and also slight increase in the liver lesion currently about 1.5 cm.  Given overall stability/and absence of any good neck systemic therapy options-continue current therapy.  We will plan to get a CT of the chest and pelvis in about 1 month from now.  Ordered.  # Brain metastases-right frontal status post SBRT; MRI November 2019 [[facility]]-lesion smaller; other lesions have gotten smaller [suggestive of response to targeted therapy].  Stable  #Bone mets-continue Xgeva.  Tolerating well.  Calcium 9.3 stable  #DISPOSITION: # infusion today # Follow up in 1 month-MD labs/x-geva-cbc/cmp/ldh-CT chest A/P prior- Dr.B    Orders Placed This Encounter  Procedures  . CT CHEST W CONTRAST    Standing Status:   Future    Standing Expiration Date:   05/06/2019    Order Specific Question:   If indicated for the ordered procedure, I authorize the administration of contrast media per Radiology protocol    Answer:   Yes    Order Specific Question:   Preferred imaging location?    Answer:   [facility]    Order Specific Question:   Radiology Contrast Protocol - do NOT remove file path    Answer:   \\[email or web address]    Order Specific Question:   ** REASON FOR EXAM (FREE TEXT)    Answer:   round mass which    Order Specific Question:   Is patient pregnant?    Answer:   No  . CT Abdomen Pelvis W Contrast    Standing Status:   Future    Standing Expiration Date:   05/05/2019    Order Specific Question:   ** REASON FOR EXAM (FREE TEXT)    Answer:   metasatic melanoma    Order Specific Question:   If indicated for the ordered procedure, I authorize the  administration of contrast media per Radiology protocol    Answer:   Yes    Order Specific Question:   Is patient pregnant?    Answer:   No  Order Specific Question:   Preferred imaging location?    Answer:   [facility]    Order Specific Question:   Is Oral Contrast requested for this exam?    Answer:   Yes, Per Radiology protocol    Order Specific Question:   Radiology Contrast Protocol - do NOT remove file path    Answer:   \\[email or web address]   All questions were answered. The patient knows to call the clinic with any problems, questions or concerns.      Cammie Sickle, MD 05/05/2018 4:08 PM

## 2018-05-05 NOTE — Assessment & Plan Note (Addendum)
#  Recurrent metastatic melanoma BRAF POSITIVE- multiple metastatic lesions to the liver; bilateral lung nodules; L1 vertebral compression fracture.  On DAB+MEK [jan 2019].   # April 03, 2018 CT scan-overall stable disease; however few new subcentimeter nodules noted in the lung [except for 1 new right lower lobe 1.6 cm met]; and also slight increase in the liver lesion currently about 1.5 cm.  Given overall stability/and absence of any good neck systemic therapy options-continue current therapy.  We will plan to get a CT of the chest and pelvis in about 1 month from now.  Ordered.  # Brain metastases-right frontal status post SBRT; MRI November 2019 [[facility]]-lesion smaller; other lesions have gotten smaller [suggestive of response to targeted therapy].  Stable  #Bone mets-continue Xgeva.  Tolerating well.  Calcium 9.3 stable  #DISPOSITION: # infusion today # Follow up in 1 month-MD labs/x-geva-cbc/cmp/ldh-CT chest A/P prior- Dr.B

## 2018-05-09 ENCOUNTER — Other Ambulatory Visit: Payer: Self-pay | Admitting: Nurse Practitioner

## 2018-05-20 MED FILL — MEKINIST 2 MG TAB: 2 | 30 days supply | Qty: 30 | Fill #3

## 2018-05-20 MED FILL — TAFINLAR 75 MG CAPSULE: 75 | 30 days supply | Qty: 120 | Fill #3

## 2018-05-21 ENCOUNTER — Telehealth: Payer: Self-pay | Admitting: *Deleted

## 2018-05-21 NOTE — Telephone Encounter (Addendum)
Patient seen in their office and CXR shows large pleural effusion left lung. They have referred her to see Dr Oleh Genin tomorrow  (new Pulmonologist) and they wanted Dr B to be aware of findings

## 2018-05-22 ENCOUNTER — Other Ambulatory Visit: Payer: Self-pay | Admitting: Pulmonary Disease

## 2018-05-22 ENCOUNTER — Ambulatory Visit
Admission: RE | Admit: 2018-05-22 | Discharge: 2018-05-22 | Disposition: A | Discharge status: Home / Self Care | Payer: BC Managed Care – PPO | Source: Ambulatory Visit | Attending: Pulmonary Disease | Admitting: Pulmonary Disease

## 2018-05-22 DIAGNOSIS — J90 Pleural effusion, not elsewhere classified: Secondary | ICD-10-CM

## 2018-05-22 DIAGNOSIS — Z9889 Other specified postprocedural states: Secondary | ICD-10-CM

## 2018-05-22 LAB — BODY FLUID CELL COUNT WITH DIFFERENTIAL
Eos, Fluid: 0 %
Lymphs, Fluid: 20 %
Monocyte-Macrophage-Serous Fluid: 48 %
Neutrophil Count, Fluid: 32 %
Total Nucleated Cell Count, Fluid: 290 cu mm

## 2018-05-22 LAB — LACTATE DEHYDROGENASE, PLEURAL OR PERITONEAL FLUID: LD, Fluid: 2379 U/L — ABNORMAL HIGH (ref 3–23)

## 2018-05-22 LAB — GLUCOSE, PLEURAL OR PERITONEAL FLUID: Glucose, Fluid: 100 mg/dL

## 2018-05-22 NOTE — Procedures (Signed)
PROCEDURE SUMMARY:  Successful US guided diagnostic and therapeutic left thoracentesis. Yielded 1.7 liters of dark, amber fluid. Pt tolerated procedure well.  Procedure was stopped prior to removal of all fluid due to patient developing persistent cough. No immediate complications.  Specimen was sent for labs. CXR ordered.  EBL < 5 mL  Docia Barrier PA-C 05/22/2018 3:58 PM

## 2018-05-25 LAB — PH, BODY FLUID: pH, Body Fluid: 7.5

## 2018-05-26 LAB — CYTOLOGY - NON PAP

## 2018-05-27 ENCOUNTER — Telehealth: Payer: Self-pay | Admitting: *Deleted

## 2018-05-27 ENCOUNTER — Other Ambulatory Visit: Payer: Self-pay

## 2018-05-27 ENCOUNTER — Other Ambulatory Visit: Payer: Self-pay | Admitting: *Deleted

## 2018-05-27 ENCOUNTER — Telehealth: Payer: Self-pay | Admitting: Internal Medicine

## 2018-05-27 ENCOUNTER — Ambulatory Visit
Admission: RE | Admit: 2018-05-27 | Discharge: 2018-05-27 | Disposition: A | Discharge status: Home / Self Care | Payer: BC Managed Care – PPO | Source: Ambulatory Visit | Attending: Internal Medicine | Admitting: Internal Medicine

## 2018-05-27 DIAGNOSIS — C787 Secondary malignant neoplasm of liver and intrahepatic bile duct: Secondary | ICD-10-CM | POA: Insufficient documentation

## 2018-05-27 MED ORDER — IOHEXOL 300 MG/ML  SOLN
100.0000 mL | Freq: Once | INTRAMUSCULAR | Status: AC | PRN
  Administered 2018-05-27: 100 mL via INTRAVENOUS

## 2018-05-27 NOTE — Telephone Encounter (Signed)
RN made md aware that pt is on the schedule next week for xgeva. I asked Dr. Rogue Bussing if he still wanted to cnl this apt. md stated to leave patient on the schedule for xgeva as scheduled.

## 2018-05-27 NOTE — Telephone Encounter (Signed)
Called report  IMPRESSION: 1. Significant disease progression, predominantly due to new extensive left pleural metastatic disease with very large malignant left pleural effusion with associated near complete left lung atelectasis and with contralateral mediastinal shift. 2. Visualized right pulmonary metastases are stable. 3. Indistinct hypoenhancing liver dome lesion is increased, potentially indicating an enlarging liver metastasis. 4. Stable subcentimeter right lower quadrant peritoneal soft tissue implant. 5. Stable sclerotic spinal metastases. 6. New small dependent right pleural effusion.  These results will be called to the ordering clinician or representative by the Radiologist Assistant, and communication documented in the PACS or zVision Dashboard.   Electronically Signed   By: Ilona Sorrel M.D.   On: 05/27/2018 13:48

## 2018-05-27 NOTE — Telephone Encounter (Signed)
Spoke to patient; regarding Pleurx catheter placement  #Please make a referral to Dr. Harvie Heck catheter placement ASAP.  #Please have the patient follow-up with me on this Friday- 3/06 at 8:30; I have asked the patient to be here at 815/for labs.  Collete please confirm with the patient.  please cancel next week appointment.

## 2018-05-28 ENCOUNTER — Inpatient Hospital Stay
Admission: EM | Admit: 2018-05-28 | Discharge: 2018-05-30 | DRG: 180 | Disposition: A | Discharge status: Home Health | Payer: BC Managed Care – PPO | Attending: Specialist | Admitting: Specialist

## 2018-05-28 ENCOUNTER — Inpatient Hospital Stay: Payer: BC Managed Care – PPO

## 2018-05-28 ENCOUNTER — Other Ambulatory Visit: Payer: Self-pay

## 2018-05-28 ENCOUNTER — Emergency Department: Payer: BC Managed Care – PPO

## 2018-05-28 ENCOUNTER — Encounter: Payer: Self-pay | Admitting: Emergency Medicine

## 2018-05-28 ENCOUNTER — Telehealth: Payer: Self-pay | Admitting: *Deleted

## 2018-05-28 ENCOUNTER — Encounter: Payer: Self-pay | Admitting: *Deleted

## 2018-05-28 DIAGNOSIS — J910 Malignant pleural effusion: Secondary | ICD-10-CM | POA: Diagnosis present

## 2018-05-28 DIAGNOSIS — J90 Pleural effusion, not elsewhere classified: Secondary | ICD-10-CM

## 2018-05-28 DIAGNOSIS — Z9889 Other specified postprocedural states: Secondary | ICD-10-CM

## 2018-05-28 DIAGNOSIS — J9601 Acute respiratory failure with hypoxia: Secondary | ICD-10-CM | POA: Diagnosis present

## 2018-05-28 DIAGNOSIS — C7931 Secondary malignant neoplasm of brain: Secondary | ICD-10-CM | POA: Diagnosis present

## 2018-05-28 DIAGNOSIS — E871 Hypo-osmolality and hyponatremia: Secondary | ICD-10-CM | POA: Diagnosis present

## 2018-05-28 DIAGNOSIS — Z9689 Presence of other specified functional implants: Secondary | ICD-10-CM

## 2018-05-28 DIAGNOSIS — C7802 Secondary malignant neoplasm of left lung: Secondary | ICD-10-CM | POA: Diagnosis present

## 2018-05-28 DIAGNOSIS — R0902 Hypoxemia: Secondary | ICD-10-CM

## 2018-05-28 DIAGNOSIS — Z6823 Body mass index (BMI) 23.0-23.9, adult: Secondary | ICD-10-CM

## 2018-05-28 DIAGNOSIS — Z66 Do not resuscitate: Secondary | ICD-10-CM | POA: Diagnosis present

## 2018-05-28 DIAGNOSIS — Z85820 Personal history of malignant melanoma of skin: Secondary | ICD-10-CM

## 2018-05-28 DIAGNOSIS — J9690 Respiratory failure, unspecified, unspecified whether with hypoxia or hypercapnia: Secondary | ICD-10-CM

## 2018-05-28 DIAGNOSIS — Z79890 Hormone replacement therapy (postmenopausal): Secondary | ICD-10-CM

## 2018-05-28 DIAGNOSIS — Z923 Personal history of irradiation: Secondary | ICD-10-CM

## 2018-05-28 DIAGNOSIS — C787 Secondary malignant neoplasm of liver and intrahepatic bile duct: Secondary | ICD-10-CM | POA: Diagnosis present

## 2018-05-28 DIAGNOSIS — C7951 Secondary malignant neoplasm of bone: Secondary | ICD-10-CM | POA: Diagnosis present

## 2018-05-28 DIAGNOSIS — Z803 Family history of malignant neoplasm of breast: Secondary | ICD-10-CM | POA: Diagnosis not present

## 2018-05-28 DIAGNOSIS — Z800 Family history of malignant neoplasm of digestive organs: Secondary | ICD-10-CM

## 2018-05-28 DIAGNOSIS — E890 Postprocedural hypothyroidism: Secondary | ICD-10-CM | POA: Diagnosis present

## 2018-05-28 DIAGNOSIS — Z79899 Other long term (current) drug therapy: Secondary | ICD-10-CM | POA: Diagnosis not present

## 2018-05-28 DIAGNOSIS — R0602 Shortness of breath: Secondary | ICD-10-CM

## 2018-05-28 DIAGNOSIS — Z87891 Personal history of nicotine dependence: Secondary | ICD-10-CM | POA: Diagnosis not present

## 2018-05-28 DIAGNOSIS — Z515 Encounter for palliative care: Secondary | ICD-10-CM | POA: Diagnosis present

## 2018-05-28 DIAGNOSIS — C439 Malignant melanoma of skin, unspecified: Secondary | ICD-10-CM | POA: Diagnosis not present

## 2018-05-28 DIAGNOSIS — Z85850 Personal history of malignant neoplasm of thyroid: Secondary | ICD-10-CM | POA: Diagnosis not present

## 2018-05-28 LAB — TROPONIN I: Troponin I: <0.03 ng/mL (ref <0.03)

## 2018-05-28 LAB — CBC WITH DIFFERENTIAL/PLATELET
Abs Immature Granulocytes: 0.05 10*3/uL (ref 0.00–0.07)
Basophils Absolute: 0 10*3/uL (ref 0.0–0.1)
Basophils Relative: 0 %
Eosinophils Absolute: 0 10*3/uL (ref 0.0–0.5)
Eosinophils Relative: 0 %
HCT: 35.8 % — ABNORMAL LOW (ref 36.0–46.0)
Hemoglobin: 11.8 g/dL — ABNORMAL LOW (ref 12.0–15.0)
Immature Granulocytes: 1 %
Lymphocytes Relative: 14 %
Lymphs Abs: 1.2 10*3/uL (ref 0.7–4.0)
MCH: 27.8 pg (ref 26.0–34.0)
MCHC: 33 g/dL (ref 30.0–36.0)
MCV: 84.4 fL (ref 80.0–100.0)
Monocytes Absolute: 0.8 10*3/uL (ref 0.1–1.0)
Monocytes Relative: 9 %
NEUTROS ABS: 6.9 10*3/uL (ref 1.7–7.7)
Neutrophils Relative %: 76 %
PLATELETS: 498 10*3/uL — AB (ref 150–400)
RBC: 4.24 MIL/uL (ref 3.87–5.11)
RDW: 13.8 % (ref 11.5–15.5)
Smear Review: NORMAL
WBC: 9 10*3/uL (ref 4.0–10.5)
nRBC: 0 % (ref 0.0–0.2)

## 2018-05-28 LAB — COMPREHENSIVE METABOLIC PANEL
ALT: 22 U/L (ref 0–44)
AST: 39 U/L (ref 15–41)
Albumin: 2.4 g/dL — ABNORMAL LOW (ref 3.5–5.0)
Alkaline Phosphatase: 147 U/L — ABNORMAL HIGH (ref 38–126)
Anion gap: 12 (ref 5–15)
BUN: 11 mg/dL (ref 6–20)
CO2: 21 mmol/L — AB (ref 22–32)
Calcium: 8.2 mg/dL — ABNORMAL LOW (ref 8.9–10.3)
Chloride: 89 mmol/L — ABNORMAL LOW (ref 98–111)
Creatinine, Ser: 0.62 mg/dL (ref 0.44–1.00)
GFR calc non Af Amer: >60 mL/min (ref >60)
Glucose, Bld: 136 mg/dL — ABNORMAL HIGH (ref 70–99)
Potassium: 3.7 mmol/L (ref 3.5–5.1)
SODIUM: 122 mmol/L — AB (ref 135–145)
Total Bilirubin: 0.3 mg/dL (ref 0.3–1.2)
Total Protein: 6 g/dL — ABNORMAL LOW (ref 6.5–8.1)

## 2018-05-28 LAB — SODIUM, URINE, RANDOM

## 2018-05-28 LAB — BRAIN NATRIURETIC PEPTIDE: B Natriuretic Peptide: 59 pg/mL (ref 0.0–100.0)

## 2018-05-28 LAB — TSH: TSH: 1.338 u[IU]/mL (ref 0.350–4.500)

## 2018-05-28 MED ORDER — HYDROCODONE-ACETAMINOPHEN 5-325 MG PO TABS
1.0000 | ORAL_TABLET | ORAL | Status: DC | PRN
  Administered 2018-05-29: 1 via ORAL
  Filled 2018-05-28: qty 1

## 2018-05-28 MED ORDER — CALCIUM CARBONATE-VITAMIN D 500-200 MG-UNIT PO TABS: 1.0000 | ORAL_TABLET | Freq: Every day | ORAL | Status: DC

## 2018-05-28 MED ORDER — CHLORHEXIDINE GLUCONATE CLOTH 2 % EX PADS
6.0000 | MEDICATED_PAD | Freq: Once | CUTANEOUS | Status: AC
  Administered 2018-05-29: 6 via TOPICAL

## 2018-05-28 MED ORDER — ALBUTEROL SULFATE (2.5 MG/3ML) 0.083% IN NEBU
2.5000 mg | INHALATION_SOLUTION | Freq: Four times a day (QID) | RESPIRATORY_TRACT | Status: DC
  Administered 2018-05-28: 19:00:00 2.5 mg via RESPIRATORY_TRACT
  Filled 2018-05-28: qty 3

## 2018-05-28 MED ORDER — CEFAZOLIN SODIUM-DEXTROSE 2-4 GM/100ML-% IV SOLN
2.0000 g | INTRAVENOUS | Status: AC
  Administered 2018-05-29: 2 g via INTRAVENOUS
  Filled 2018-05-28: qty 100

## 2018-05-28 MED ORDER — ONDANSETRON HCL 4 MG/2ML IJ SOLN: 4.0000 mg | Freq: Four times a day (QID) | INTRAMUSCULAR | Status: DC | PRN

## 2018-05-28 MED ORDER — SODIUM CHLORIDE 1 G PO TABS
2.0000 g | ORAL_TABLET | Freq: Three times a day (TID) | ORAL | Status: DC
  Administered 2018-05-28 – 2018-05-30 (×2): 2 g via ORAL
  Filled 2018-05-28 (×5): qty 2

## 2018-05-28 MED ORDER — ALBUTEROL SULFATE (2.5 MG/3ML) 0.083% IN NEBU
2.5000 mg | INHALATION_SOLUTION | Freq: Three times a day (TID) | RESPIRATORY_TRACT | Status: DC
  Administered 2018-05-29: 2.5 mg via RESPIRATORY_TRACT
  Filled 2018-05-28: qty 3

## 2018-05-28 MED ORDER — DABRAFENIB MESYLATE 75 MG PO CAPS: 150.0000 mg | ORAL_CAPSULE | Freq: Two times a day (BID) | ORAL | Status: DC

## 2018-05-28 MED ORDER — ONDANSETRON HCL 4 MG PO TABS: 4.0000 mg | ORAL_TABLET | Freq: Four times a day (QID) | ORAL | Status: DC | PRN

## 2018-05-28 MED ORDER — POLYETHYLENE GLYCOL 3350 17 G PO PACK: 17.0000 g | PACK | Freq: Every day | ORAL | Status: DC | PRN

## 2018-05-28 MED ORDER — LIDOCAINE-PRILOCAINE 2.5-2.5 % EX CREA
1.0000 "application " | TOPICAL_CREAM | CUTANEOUS | Status: DC | PRN
  Filled 2018-05-28: qty 5

## 2018-05-28 MED ORDER — ACETAMINOPHEN 650 MG RE SUPP: 650.0000 mg | Freq: Four times a day (QID) | RECTAL | Status: DC | PRN

## 2018-05-28 MED ORDER — DEXTROSE-NACL 5-0.9 % IV SOLN
INTRAVENOUS | Status: DC
  Administered 2018-05-28 – 2018-05-29 (×2): via INTRAVENOUS

## 2018-05-28 MED ORDER — ACETAMINOPHEN 325 MG PO TABS: 650.0000 mg | ORAL_TABLET | Freq: Four times a day (QID) | ORAL | Status: DC | PRN

## 2018-05-28 MED ORDER — LEVOTHYROXINE SODIUM 50 MCG PO TABS
75.0000 ug | ORAL_TABLET | Freq: Every day | ORAL | Status: DC
  Administered 2018-05-30: 75 ug via ORAL
  Filled 2018-05-28: qty 1

## 2018-05-28 MED ORDER — TRAMETINIB DIMETHYL SULFOXIDE 2 MG PO TABS: 2.0000 mg | ORAL_TABLET | Freq: Every day | ORAL | Status: DC

## 2018-05-28 NOTE — ED Notes (Signed)
ED Provider at bedside. 

## 2018-05-28 NOTE — Progress Notes (Signed)
Patient ID: Tanya Vega, female   DOB: [birth year], 58 y.o.   MRN: [id number]  Chief Complaint  Patient presents with  . Shortness of Breath    Referred By Dr. Fabienne Bruns Reason for Referral metastatic melanoma with malignant left pleural effusion  HPI Location, Quality, Duration, Severity, Timing, Context, Modifying Factors, Associated Signs and Symptoms.  Tanya Vega is a 58 y.o. female.  Tanya Vega is a 58 year old female who presents to the emergency department this afternoon with increasing shortness of breath.  Patient recently was seen a few days ago where she underwent a left-sided thoracentesis with immediate relief of her symptoms of shortness of breath.  She was scheduled to follow-up with Dr. Lynett Fish tomorrow but became increasingly short of breath and presented to the emergency department.  Her chief complaint at this time is shortness of breath that is not relieved with any specific activities.  With exertion she does become more short of breath.  She has had no fevers or chills.  She states that her appetite is been good except for the last few days.  She had significant relief of her shortness of breath last week when I did her thoracentesis of a little over a liter.  Her CT scan was independently reviewed by me with Dr. Pascal Lux as well as her ultrasound today.  That reveals a large left-sided pleural effusion as well as a smaller right-sided pleural effusion.  I was with the patient today when she underwent a thoracentesis.  Approximately 1.6 L of fluid was removed.  She did experience some coughing and felt better with that.   Past Medical History:  Diagnosis Date  . Hypothyroidism   . Melanoma of skin ([place]) 2011   Resected from Right neck area with 4 lymph nodes as well.   . Thyroid cancer [facility]) 2001   Partial thyroidectomy    Past Surgical History:  Procedure Laterality Date  . BREAST BIOPSY Right 2005   neg  . BREAST BIOPSY Left 07/11/2016   radial scar  . BREAST  EXCISIONAL BIOPSY Left 08/06/2016   lumpectomy for radial scar  . BREAST LUMPECTOMY WITH NEEDLE LOCALIZATION Left 08/06/2016   Procedure: BREAST LUMPECTOMY WITH NEEDLE LOCALIZATION;  Surgeon: Leonie Green, MD;  Location: [facility] OR[facility];  Service: General;  Laterality: Left;  Marland Kitchen MELANOMA EXCISION  2011   ear  . PORTA CATH INSERTION N/A 03/14/2017   Procedure: PORTA CATH INSERTION;  Surgeon: Katha Cabal, MD;  Location: [place] CV LAB;  Service: Cardiovascular;  Laterality: N/A;  . THYROIDECTOMY, PARTIAL Left     Family History  Problem Relation Age of Onset  . Breast cancer Mother 91  . Breast cancer Paternal Aunt   . Colon cancer Brother 38    Social History Social History   Tobacco Use  . Smoking status: Former Smoker    Packs/day: 0.25    Years: 20.00    Pack years: 5.00    Types: Cigarettes    Last attempt to quit: 03/26/1999    Years since quitting: 19.1  . Smokeless tobacco: Never Used  Substance Use Topics  . Alcohol use: Yes    Comment: WEEKENDS  . Drug use: No    Allergies  Allergen Reactions  . Tetanus-Diphtheria Toxoids Td Other (See Comments)    Sores on skin    No current facility-administered medications for this encounter.    Current Outpatient Medications  Medication Sig Dispense Refill  . Calcium Carbonate-Vitamin D (CALCIUM 500/VITAMIN D PO) Take 2 tablets  by mouth daily.     Marland Kitchen levothyroxine (SYNTHROID, LEVOTHROID) 75 MCG tablet TAKE 1 TABLET (75 MCG TOTAL) BY MOUTH DAILY BEFORE BREAKFAST. 90 tablet 2  . lidocaine-prilocaine (EMLA) cream Apply 1 application topically as needed. 30 g 4  . MEKINIST 2 MG tablet TAKE 1 TABLET (2 MG TOTAL) BY MOUTH DAILY. TAKE 1 HOUR BEFORE OR 2 HOURS AFTER A MEAL. STORE REFRIGERATED IN ORIGINAL CONTAINER. (Patient taking differently: Take 2 mg by mouth daily. ) 30 tablet 4  . TAFINLAR 75 MG capsule TAKE 2 CAPSULES (150 MG TOTAL) BY MOUTH 2 (TWO) TIMES DAILY. TAKE ON AN EMPTY STOMACH 1 HOUR BEFORE OR 2 HOURS  AFTER MEALS. (Patient taking differently: Take 150 mg by mouth 2 (two) times daily. ) 120 capsule 4      Review of Systems A complete review of systems was asked and was negative except for the following positive findings increasing shortness of breath.  Diminished appetite.  Blood pressure (!) 141/77, pulse (!) 105, temperature 98.4 F (36.9 C), temperature source Oral, resp. rate (!) 23, height 5\' 4"  (1.626 m), weight 57.6 kg, last menstrual period 02/29/2012, SpO2 100 %.  Physical Exam CONSTITUTIONAL:  Pleasant, well-developed, well-nourished, and in no acute distress. EYES: Pupils equal and reactive to light, Sclera non-icteric EARS, NOSE, MOUTH AND THROAT:  The oropharynx was clear.  Dentition is good repair.  Oral mucosa pink and moist. LYMPH NODES:  Lymph nodes in the neck and axillae were normal RESPIRATORY:  Lungs were clear on the right but there are absent breath sounds on the left.  Normal respiratory effort without pathologic use of accessory muscles of respiration CARDIOVASCULAR: Heart was regular without murmurs.  There were no carotid bruits. GI: The abdomen was soft, nontender, and nondistended. There were no palpable masses. There was no hepatosplenomegaly. There were normal bowel sounds in all quadrants. GU:  Rectal deferred.   MUSCULOSKELETAL:  Normal muscle strength and tone.  No clubbing or cyanosis.   SKIN:  There were no pathologic skin lesions.  There were no nodules on palpation. NEUROLOGIC:  Sensation is normal.  Cranial nerves are grossly intact. PSYCH:  Oriented to person, place and time.  Mood and affect are normal.  Data Reviewed CT scan and chest x-ray  I have personally reviewed the patient's imaging, laboratory findings and medical records.    Assessment    Malignant pleural effusion left side    Plan    I had a long discussion with her and her family today.  I discussed her care with Dr. Lynett Fish.    I have explained to her and her husband  that my current recommendation would be to proceed with Pleurx catheter insertion.  They understand the rationale for this.  I explained to them the indications and risks of surgery.  I explained to them that the catheter although considered permanent may be removed in the future if she so desires are no longer needs it.  All her questions were answered.       Nestor Lewandowsky, MD 05/28/2018, 4:01 PM   Patient ID: Tanya Vega, female   DOB: [birth year], 57 y.o.   MRN: [id number]

## 2018-05-28 NOTE — Procedures (Signed)
Pre procedural Dx: Symptomatic Pleural effusion Post procedural Dx: Same  Successful US guided left sided thoracentesis yielding 2 L of serous, slightly blood tinged pleural fluid.    Note, an incomplete thoracentesis was performed as Dr. Genevive Bi is planning on placing a tunneled pleural drain tomorrow.   EBL: None Complications: None immediate.  Ronny Bacon, MD Pager #: [phone]

## 2018-05-28 NOTE — ED Notes (Signed)
Patient transported to X-ray 

## 2018-05-28 NOTE — Telephone Encounter (Signed)
Patient called to report that she is in ER and they are admitting her and plan to do thoracentesis, so she will not be here for her appointment tomorrow. She requests a return call from Dr Sharmaine Base team

## 2018-05-28 NOTE — ED Notes (Addendum)
ED TO INPATIENT HANDOFF REPORT  ED Nurse Name and Phone #: Annie Main [phone]  S Name/Age/Gender Tanya Vega 58 y.o. female Room/Bed: ED03A/ED03A  Code Status   Code Status: Not on file  Home/SNF/Other Home Patient oriented to: self, place, time and situation Is this baseline? Yes   Triage Complete: Triage complete  Chief Complaint difficulty breathing  Triage Note Pt to ED via [facility] from home c/o SOB that started last night, hx lung CA.  Was found to be 85% RA by fire department and placed on 2L [place] up to 94%, pt dropped oxygen sats again and placed on 4L [place] by EMS up to 96%.  Presents A&Ox4, speaking in complete and coherent sentences, skin WNL.   Allergies Allergies  Allergen Reactions  . Tetanus-Diphtheria Toxoids Td Other (See Comments)    Sores on skin    Level of Care/Admitting Diagnosis ED Disposition    ED Disposition Condition [facility] Area: [place] [[id number]]  Level of Care: Med-Surg [16]  Diagnosis: Malignant pleural effusion [511.81.ICD-9-CM]  Admitting Physician: Gorden Harms [[id number]]  Attending Physician: Gorden Harms [[id number]]  Estimated length of stay: past midnight tomorrow  Certification:: I certify this patient will need inpatient services for at least 2 midnights  PT Class (Do Not Modify): Inpatient [101]  PT Acc Code (Do Not Modify): Private [1]       B Medical/Surgery History Past Medical History:  Diagnosis Date  . Hypothyroidism   . Melanoma of skin ([place]) 2011   Resected from Right neck area with 4 lymph nodes as well.   . Thyroid cancer [facility]) 2001   Partial thyroidectomy   Past Surgical History:  Procedure Laterality Date  . BREAST BIOPSY Right 2005   neg  . BREAST BIOPSY Left 07/11/2016   radial scar  . BREAST EXCISIONAL BIOPSY Left 08/06/2016   lumpectomy for radial scar  . BREAST LUMPECTOMY WITH NEEDLE LOCALIZATION Left 08/06/2016   Procedure: BREAST LUMPECTOMY WITH  NEEDLE LOCALIZATION;  Surgeon: Leonie Green, MD;  Location: [facility] ORS;  Service: General;  Laterality: Left;  Marland Kitchen MELANOMA EXCISION  2011   ear  . PORTA CATH INSERTION N/A 03/14/2017   Procedure: PORTA CATH INSERTION;  Surgeon: Katha Cabal, MD;  Location: [facility];  Service: Cardiovascular;  Laterality: N/A;  . THYROIDECTOMY, PARTIAL Left      A IV Location/Drains/Wounds Patient Lines/Drains/Airways Status   Active Line/Drains/Airways    Name:   Placement date:   Placement time:   Site:   Days:   Implanted Port 03/26/17 Right Chest   03/26/17    1030    Chest   428   Peripheral IV 05/28/18 Right Antecubital   05/28/18    1143    Antecubital   less than 1   Incision (Closed) 08/06/16 Breast Left   08/06/16    1130     660          Intake/Output Last 24 hours No intake or output data in the 24 hours ending 05/28/18 1428  Labs/Imaging Results for orders placed or performed during the hospital encounter of 05/28/18 (from the past 48 hour(s))  Comprehensive metabolic panel     Status: Abnormal   Collection Time: 05/28/18 11:45 AM  Result Value Ref Range   Sodium 122 (L) 135 - 145 mmol/L   Potassium 3.7 3.5 - 5.1 mmol/L   Chloride 89 (L) 98 - 111 mmol/L   CO2  21 (L) 22 - 32 mmol/L   Glucose, Bld 136 (H) 70 - 99 mg/dL   BUN 11 6 - 20 mg/dL   Creatinine, Ser 0.62 0.44 - 1.00 mg/dL   Calcium 8.2 (L) 8.9 - 10.3 mg/dL   Total Protein 6.0 (L) 6.5 - 8.1 g/dL   Albumin 2.4 (L) 3.5 - 5.0 g/dL   AST 39 15 - 41 U/L   ALT 22 0 - 44 U/L   Alkaline Phosphatase 147 (H) 38 - 126 U/L   Total Bilirubin 0.3 0.3 - 1.2 mg/dL   GFR calc non Af Amer >60 >60 mL/min   GFR calc Af Amer >60 >60 mL/min   Anion gap 12 5 - 15    Comment: Performed at [facility], [address] [place], [place] [address]  Brain natriuretic peptide     Status: None   Collection Time: 05/28/18 11:45 AM  Result Value Ref Range   B Natriuretic Peptide 59.0 0.0 - 100.0 pg/mL    Comment:  Performed at [facility], [address] [place], [place] [address]  Troponin I - Once     Status: None   Collection Time: 05/28/18 11:45 AM  Result Value Ref Range   Troponin I <0.03 <0.03 ng/mL    Comment: Performed at [facility], [address] [place], [place] [address]  CBC with Differential     Status: Abnormal   Collection Time: 05/28/18 11:45 AM  Result Value Ref Range   WBC 9.0 4.0 - 10.5 K/uL   RBC 4.24 3.87 - 5.11 MIL/uL   Hemoglobin 11.8 (L) 12.0 - 15.0 g/dL   HCT 35.8 (L) 36.0 - 46.0 %   MCV 84.4 80.0 - 100.0 fL   MCH 27.8 26.0 - 34.0 pg   MCHC 33.0 30.0 - 36.0 g/dL   RDW 13.8 11.5 - 15.5 %   Platelets 498 (H) 150 - 400 K/uL   nRBC 0.0 0.0 - 0.2 %   Neutrophils Relative % 76 %   Neutro Abs 6.9 1.7 - 7.7 K/uL   Lymphocytes Relative 14 %   Lymphs Abs 1.2 0.7 - 4.0 K/uL   Monocytes Relative 9 %   Monocytes Absolute 0.8 0.1 - 1.0 K/uL   Eosinophils Relative 0 %   Eosinophils Absolute 0.0 0.0 - 0.5 K/uL   Basophils Relative 0 %   Basophils Absolute 0.0 0.0 - 0.1 K/uL   WBC Morphology MORPHOLOGY UNREMARKABLE    RBC Morphology MORPHOLOGY UNREMARKABLE    Smear Review Normal platelet morphology    Immature Granulocytes 1 %   Abs Immature Granulocytes 0.05 0.00 - 0.07 K/uL    Comment: Performed at [facility], [address] [place], [place] [address]   Dg Chest 2 View  Result Date: 05/28/2018 CLINICAL DATA:  Shortness of breath started last night. EXAM: CHEST - 2 VIEW COMPARISON:  CT chest 05/27/2018 FINDINGS: Large left pleural effusion with only a small area of aerated lung at the apex. Left paramediastinal pleural mass better visualized on CT chest performed 05/27/2018. Small right pleural effusion. No pneumothorax. Stable cardiomediastinal silhouette. Right-sided Port-A-Cath in satisfactory position. No acute osseous abnormality. IMPRESSION: 1. Large left pleural effusion with only a small area of aerated lung at the apex. Small right  pleural effusion. No interval change compared with 05/27/2018. Electronically Signed   By: Kathreen Devoid   On: 05/28/2018 12:35   Ct Chest W Contrast  Result Date: 05/27/2018 CLINICAL DATA:  Recurrent stage IV  metastatic melanoma originally diagnosed 2011. Ongoing medical therapy. Restaging. EXAM: CT CHEST, ABDOMEN, AND PELVIS WITH CONTRAST TECHNIQUE: Multidetector CT imaging of the chest, abdomen and pelvis was performed following the standard protocol during bolus administration of intravenous contrast. CONTRAST:  184mL OMNIPAQUE IOHEXOL 300 MG/ML  SOLN COMPARISON:  04/03/2018 CT chest, abdomen and pelvis. FINDINGS: CT CHEST FINDINGS Cardiovascular: Normal heart size. No significant pericardial effusion/thickening. Right internal jugular Port-A-Cath terminates in the lower third of the SVC. Atherosclerotic nonaneurysmal thoracic aorta. Normal caliber pulmonary arteries. No central pulmonary emboli. Mediastinum/Nodes: Stable subcentimeter hypodense right thyroid lobe nodule. Apparent left hemithyroidectomy. Unremarkable esophagus. No pathologically enlarged axillary, mediastinal or hilar lymph nodes. Lungs/Pleura: No pneumothorax. Small dependent right pleural effusion is new. Very large new left pleural effusion with associated complete left lower lobe and near complete left upper lobe atelectasis. There is extensive new irregular nodular plaque-like metastatic disease throughout the left pleural space measuring up to 5.6 x 2.9 cm in the medial upper left pleural space (series 2/image 16). Numerous (greater than 20) solid pulmonary nodules scattered throughout right lung, not appreciably changed. Representative stable 1.1 cm right middle lobe nodule (series 3/image 100) and stable 9 mm basilar right lower lobe nodule (series 3/image 113). Musculoskeletal: Stable sclerotic T7 and T9 vertebral lesions. No new focal osseous lesions. CT ABDOMEN PELVIS FINDINGS Hepatobiliary: Normal liver size. Previously described  vague hypoenhancing focus in the peripheral right liver lobe appears stable, measuring 1.7 x 1.4 cm (series 2/image 57), previously 1.6 x 1.4 cm using similar measurement technique. Poorly marginated hypoenhancing 2.5 x 2.0 cm anterior liver dome focus (series 2/image 50), increased from 1.1 x 0 9 cm. Numerous small low-attenuation circumscribed lesions scattered throughout the liver unchanged, largest 2.0 cm in the left liver lobe (series 2/image 62). No appreciable new liver lesions. Prominent diffuse gallbladder wall thickening is new. No radiopaque cholelithiasis. No pericholecystic fluid. No biliary ductal dilatation. Pancreas: Normal, with no mass or duct dilation. Spleen: Normal size. No mass. Adrenals/Urinary Tract: Normal adrenals. No hydronephrosis. Scattered tiny subcentimeter hypodense renal cortical lesions are too small to characterize. Normal bladder. Stomach/Bowel: Normal non-distended stomach. Normal caliber small bowel with no small bowel wall thickening. Normal appendix. Normal large bowel with no diverticulosis, large bowel wall thickening or pericolonic fat stranding. Vascular/Lymphatic: Atherosclerotic nonaneurysmal abdominal aorta. Patent portal, splenic, hepatic and renal veins. No pathologically enlarged lymph nodes in the abdomen or pelvis. Reproductive: Simple 2.1 cm left adnexal cyst is stable. No right adnexal mass. Stable mildly enlarged uterus with scattered coarse internal calcifications suggesting partially calcified uterine fibroids. Other: No pneumoperitoneum, ascites or focal fluid collection. Right lower quadrant 6 mm soft tissue nodule within the peritoneal fat adjacent to right pelvic small bowel loops (series 2/image 105), not appreciably changed. Musculoskeletal: Stable large sclerotic L1 vertebral lesion with chronic severe L1 vertebral pathologic fracture. No new focal osseous lesions. Moderate lower lumbar spondylosis. IMPRESSION: 1. Significant disease progression,  predominantly due to new extensive left pleural metastatic disease with very large malignant left pleural effusion with associated near complete left lung atelectasis and with contralateral mediastinal shift. 2. Visualized right pulmonary metastases are stable. 3. Indistinct hypoenhancing liver dome lesion is increased, potentially indicating an enlarging liver metastasis. 4. Stable subcentimeter right lower quadrant peritoneal soft tissue implant. 5. Stable sclerotic spinal metastases. 6. New small dependent right pleural effusion. These results will be called to the ordering clinician or representative by the Radiologist Assistant, and communication documented in the PACS or zVision Dashboard. Electronically Signed  By: Ilona Sorrel M.D.   On: 05/27/2018 13:48   Ct Abdomen Pelvis W Contrast  Result Date: 05/27/2018 CLINICAL DATA:  Recurrent stage IV metastatic melanoma originally diagnosed 2011. Ongoing medical therapy. Restaging. EXAM: CT CHEST, ABDOMEN, AND PELVIS WITH CONTRAST TECHNIQUE: Multidetector CT imaging of the chest, abdomen and pelvis was performed following the standard protocol during bolus administration of intravenous contrast. CONTRAST:  130mL OMNIPAQUE IOHEXOL 300 MG/ML  SOLN COMPARISON:  04/03/2018 CT chest, abdomen and pelvis. FINDINGS: CT CHEST FINDINGS Cardiovascular: Normal heart size. No significant pericardial effusion/thickening. Right internal jugular Port-A-Cath terminates in the lower third of the SVC. Atherosclerotic nonaneurysmal thoracic aorta. Normal caliber pulmonary arteries. No central pulmonary emboli. Mediastinum/Nodes: Stable subcentimeter hypodense right thyroid lobe nodule. Apparent left hemithyroidectomy. Unremarkable esophagus. No pathologically enlarged axillary, mediastinal or hilar lymph nodes. Lungs/Pleura: No pneumothorax. Small dependent right pleural effusion is new. Very large new left pleural effusion with associated complete left lower lobe and near  complete left upper lobe atelectasis. There is extensive new irregular nodular plaque-like metastatic disease throughout the left pleural space measuring up to 5.6 x 2.9 cm in the medial upper left pleural space (series 2/image 16). Numerous (greater than 20) solid pulmonary nodules scattered throughout right lung, not appreciably changed. Representative stable 1.1 cm right middle lobe nodule (series 3/image 100) and stable 9 mm basilar right lower lobe nodule (series 3/image 113). Musculoskeletal: Stable sclerotic T7 and T9 vertebral lesions. No new focal osseous lesions. CT ABDOMEN PELVIS FINDINGS Hepatobiliary: Normal liver size. Previously described vague hypoenhancing focus in the peripheral right liver lobe appears stable, measuring 1.7 x 1.4 cm (series 2/image 57), previously 1.6 x 1.4 cm using similar measurement technique. Poorly marginated hypoenhancing 2.5 x 2.0 cm anterior liver dome focus (series 2/image 50), increased from 1.1 x 0 9 cm. Numerous small low-attenuation circumscribed lesions scattered throughout the liver unchanged, largest 2.0 cm in the left liver lobe (series 2/image 62). No appreciable new liver lesions. Prominent diffuse gallbladder wall thickening is new. No radiopaque cholelithiasis. No pericholecystic fluid. No biliary ductal dilatation. Pancreas: Normal, with no mass or duct dilation. Spleen: Normal size. No mass. Adrenals/Urinary Tract: Normal adrenals. No hydronephrosis. Scattered tiny subcentimeter hypodense renal cortical lesions are too small to characterize. Normal bladder. Stomach/Bowel: Normal non-distended stomach. Normal caliber small bowel with no small bowel wall thickening. Normal appendix. Normal large bowel with no diverticulosis, large bowel wall thickening or pericolonic fat stranding. Vascular/Lymphatic: Atherosclerotic nonaneurysmal abdominal aorta. Patent portal, splenic, hepatic and renal veins. No pathologically enlarged lymph nodes in the abdomen or pelvis.  Reproductive: Simple 2.1 cm left adnexal cyst is stable. No right adnexal mass. Stable mildly enlarged uterus with scattered coarse internal calcifications suggesting partially calcified uterine fibroids. Other: No pneumoperitoneum, ascites or focal fluid collection. Right lower quadrant 6 mm soft tissue nodule within the peritoneal fat adjacent to right pelvic small bowel loops (series 2/image 105), not appreciably changed. Musculoskeletal: Stable large sclerotic L1 vertebral lesion with chronic severe L1 vertebral pathologic fracture. No new focal osseous lesions. Moderate lower lumbar spondylosis. IMPRESSION: 1. Significant disease progression, predominantly due to new extensive left pleural metastatic disease with very large malignant left pleural effusion with associated near complete left lung atelectasis and with contralateral mediastinal shift. 2. Visualized right pulmonary metastases are stable. 3. Indistinct hypoenhancing liver dome lesion is increased, potentially indicating an enlarging liver metastasis. 4. Stable subcentimeter right lower quadrant peritoneal soft tissue implant. 5. Stable sclerotic spinal metastases. 6. New small dependent right pleural effusion. These  results will be called to the ordering clinician or representative by the Radiologist Assistant, and communication documented in the PACS or zVision Dashboard. Electronically Signed   By: Ilona Sorrel M.D.   On: 05/27/2018 13:48    Pending Labs [employer] (From admission, onward)    Start     Ordered   Signed and Held  HIV antibody (Routine Testing)  Once,   R     Signed and Held   Signed and Occupational hygienist morning,   R     Signed and Held   Signed and Held  Sodium, urine, random  Once,   R     Signed and Held   Signed and Held  Sodium, urine, random  Once,   R     Signed and Held   Signed and Held  TSH  Once,   R     Signed and Held          Vitals/Pain Today's Vitals   05/28/18 1243  05/28/18 1245 05/28/18 1300 05/28/18 1330  BP: (!) 134/98  (!) 137/108 (!) 132/106  Pulse: (!) 113 (!) 109 (!) 110 (!) 113  Resp: 18 19 (!) 21 18  Temp:      TempSrc:      SpO2: 100% 100% 100% 100%  Weight:      Height:      PainSc:        Isolation Precautions No active isolations  Medications Medications - No data to display  Mobility walks Low fall risk   Focused Assessments Pulmonary Assessment Handoff:  Lung sounds: L Breath Sounds: Diminished R Breath Sounds: Clear O2 Device: Nasal Cannula O2 Flow Rate (L/min): 4 L/min      R Recommendations: See Admitting Provider Note  Report given to: Andee Poles, RN  Additional Notes:

## 2018-05-28 NOTE — Anesthesia Preprocedure Evaluation (Deleted)
Anesthesia Evaluation    Airway        Dental   Pulmonary shortness of breath, former smoker,  Pleural effusion          Cardiovascular negative cardio ROS       Neuro/Psych negative psych ROS   GI/Hepatic   Endo/Other  Hypothyroidism   Renal/GU      Musculoskeletal   Abdominal   Peds negative pediatric ROS (+)  Hematology   Anesthesia Other Findings Past Medical History: No date: Hypothyroidism 2011: Melanoma of skin ([place])     Comment:  Resected from Right neck area with 4 lymph nodes as               well.  2001: Thyroid cancer ([place])     Comment:  Partial thyroidectomy   Reproductive/Obstetrics                            Anesthesia Physical Anesthesia Plan  ASA:   Anesthesia Plan:    Post-op Pain Management:    Induction:   PONV Risk Score and Plan:   Airway Management Planned:   Additional Equipment:   Intra-op Plan:   Post-operative Plan:   Informed Consent:   Plan Discussed with:   Anesthesia Plan Comments: (We would want serum Na to be greater than 125 to proceed with the case non emergently )        Anesthesia Quick Evaluation                                  Anesthesia Evaluation  Patient identified by MRN, date of birth, ID band Patient awake    Reviewed: Allergy & Precautions, NPO status , Patient's Chart, lab work & pertinent test results  History of Anesthesia Complications Negative for: history of anesthetic complications  Airway Mallampati: II       Dental   Pulmonary neg pulmonary ROS, former smoker,           Cardiovascular negative cardio ROS       Neuro/Psych negative neurological ROS     GI/Hepatic negative GI ROS, Neg liver ROS,   Endo/Other  negative endocrine ROSHypothyroidism (s/p thyroid cancer)   Renal/GU negative Renal ROS     Musculoskeletal   Abdominal   Peds  Hematology negative hematology  ROS (+)   Anesthesia Other Findings   Reproductive/Obstetrics                             Anesthesia Physical Anesthesia Plan  ASA: II  Anesthesia Plan: General   Post-op Pain Management:    Induction: Intravenous  Airway Management Planned: LMA  Additional Equipment:   Intra-op Plan:   Post-operative Plan:   Informed Consent: I have reviewed the patients History and Physical, chart, labs and discussed the procedure including the risks, benefits and alternatives for the proposed anesthesia with the patient or authorized representative who has indicated his/her understanding and acceptance.     Plan Discussed with:   Anesthesia Plan Comments:         Anesthesia Quick Evaluation

## 2018-05-28 NOTE — ED Notes (Signed)
Patient transported to Ultrasound 

## 2018-05-28 NOTE — ED Provider Notes (Signed)
[facility] Emergency Department Provider Note   ____________________________________________   First MD Initiated Contact with Patient 05/28/18 1144     (approximate)  I have reviewed the triage vital signs and the nursing notes.   HISTORY  Chief Complaint Shortness of Breath    HPI Tanya Vega is a 58 y.o. female with multiply metastatic melanoma who recently had a liter and a half drained from her pleural effusion.  She reports a deep wet cough that is nonproductive for over a month.  She has had increasing shortness of breath again tonight.  O2 sats were in the 80s EMS put her on oxygen and now they are in the mid 90s.  Patient is not having any fever chest pain or other complaints.         Past Medical History:  Diagnosis Date  . Hypothyroidism   . Melanoma of skin ([place]) 2011   Resected from Right neck area with 4 lymph nodes as well.   . Thyroid cancer [facility]) 2001   Partial thyroidectomy    Patient Active Problem List   Diagnosis Date Noted  . Encounter for monitoring cardiotoxic drug therapy 04/16/2017  . Dehydration 04/15/2017  . Goals of care, counseling/discussion 03/04/2017  . 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 Cancer, metastatic to bone ([place]) 02/18/2017    Past Surgical History:  Procedure Laterality Date  . BREAST BIOPSY Right 2005   neg  . BREAST BIOPSY Left 07/11/2016   radial scar  . BREAST EXCISIONAL BIOPSY Left 08/06/2016   lumpectomy for radial scar  . BREAST LUMPECTOMY WITH NEEDLE LOCALIZATION Left 08/06/2016   Procedure: BREAST LUMPECTOMY WITH NEEDLE LOCALIZATION;  Surgeon: Leonie Green, MD;  Location: [facility] ORS;  Service: General;  Laterality: Left;  Marland Kitchen MELANOMA EXCISION  2011   ear  . PORTA CATH INSERTION N/A 03/14/2017   Procedure: PORTA CATH INSERTION;  Surgeon: Katha Cabal, MD;  Location: [place] [facility];  Service: Cardiovascular;  Laterality: N/A;  . THYROIDECTOMY, PARTIAL Left      Prior to Admission medications   Medication Sig Start Date End Date Taking? Authorizing Provider  Calcium Carbonate-Vitamin D (CALCIUM 500/VITAMIN D PO) Take 2 tablets by mouth daily.     [provider]  levothyroxine (SYNTHROID, LEVOTHROID) 75 MCG tablet TAKE 1 TABLET (75 MCG TOTAL) BY MOUTH DAILY BEFORE BREAKFAST. 01/21/18   Cammie Sickle, MD  lidocaine-prilocaine (EMLA) cream Apply 1 application topically as needed. 03/14/17   Cammie Sickle, MD  MEKINIST 2 MG tablet TAKE 1 TABLET (2 MG TOTAL) BY MOUTH DAILY. TAKE 1 HOUR BEFORE OR 2 HOURS AFTER A MEAL. STORE REFRIGERATED IN ORIGINAL CONTAINER. Patient taking differently: Take 2 mg by mouth daily.  02/17/18   Cammie Sickle, MD  TAFINLAR 75 MG capsule TAKE 2 CAPSULES (150 MG TOTAL) BY MOUTH 2 (TWO) TIMES DAILY. TAKE ON AN EMPTY STOMACH 1 HOUR BEFORE OR 2 HOURS AFTER MEALS. Patient taking differently: Take 150 mg by mouth 2 (two) times daily.  02/17/18   Cammie Sickle, MD    Allergies Tetanus-diphtheria toxoids td  Family History  Problem Relation Age of Onset  . Breast cancer Mother 20  . Breast cancer Paternal Aunt   . Colon cancer Brother 34    Social History Social History   Tobacco Use  . Smoking status: Former Smoker    Packs/day: 0.25    Years: 20.00    Pack years: 5.00    Types:  Cigarettes    Last attempt to quit: 03/26/1999    Years since quitting: 19.1  . Smokeless tobacco: Never Used  Substance Use Topics  . Alcohol use: Yes    Comment: WEEKENDS  . Drug use: No    Review of Systems  Constitutional: No fever/chills Eyes: No visual changes. ENT: No sore throat. Cardiovascular: Denies chest pain. Respiratory: shortness of breath. Gastrointestinal: No abdominal pain.  No nausea, no vomiting.  No diarrhea.  No constipation. Genitourinary: Negative for dysuria. Musculoskeletal: Negative for back pain. Skin: Negative for rash. Neurological: Negative for headaches,  focal weakness   ____________________________________________   PHYSICAL EXAM:  VITAL SIGNS: ED Triage Vitals  Enc Vitals Group     BP 05/28/18 1138 (!) 137/107     Pulse Rate 05/28/18 1138 (!) 113     Resp 05/28/18 1138 19     Temp 05/28/18 1138 98.4 F (36.9 C)     Temp Source 05/28/18 1138 Oral     SpO2 05/28/18 1138 100 %     Weight 05/28/18 1139 127 lb (57.6 kg)     Height 05/28/18 1139 5\' 4"  (1.626 m)     Head Circumference --      Peak Flow --      Pain Score 05/28/18 1139 0     Pain Loc --      Pain Edu? --      Excl. in [place]? --     Constitutional: Alert and oriented. Well appearing and in no acute distress. Eyes: Conjunctivae are normal.  Head: Atraumatic. Nose: No congestion/rhinnorhea. Mouth/Throat: Mucous membranes are moist.  Oropharynx non-erythematous. Neck: No stridor.   Cardiovascular: Normal rate, regular rhythm. Grossly normal heart sounds.  Good peripheral circulation. Respiratory: Normal respiratory effort.  No retractions. Lungs CTAB  Gastrointestinal: Soft and nontender. No distention. No abdominal bruits. No CVA tenderness. Musculoskeletal: No lower extremity tenderness nor edema. Neurologic:  Normal speech and language. No gross focal neurologic deficits are appreciated. No gait instability. Skin:  Skin is warm, dry and intact. No rash noted. Psychiatric: Mood and affect are normal. Speech and behavior are normal.  ____________________________________________   LABS (all labs ordered are listed, but only abnormal results are displayed)  Labs Reviewed  COMPREHENSIVE METABOLIC PANEL - Abnormal; Notable for the following components:      Result Value   Sodium 122 (*)    Chloride 89 (*)    CO2 21 (*)    Glucose, Bld 136 (*)    Calcium 8.2 (*)    Total Protein 6.0 (*)    Albumin 2.4 (*)    Alkaline Phosphatase 147 (*)    All other components within normal limits  CBC WITH DIFFERENTIAL/PLATELET - Abnormal; Notable for the following  components:   Hemoglobin 11.8 (*)    HCT 35.8 (*)    Platelets 498 (*)    All other components within normal limits  TROPONIN I  BRAIN NATRIURETIC PEPTIDE   ____________________________________________  EKG  EKG read interpreted by me shows sinus tachycardia rate of 115 normal axis diffuse ST segment flattening very low amplitude throughout. ____________________________________________  RADIOLOGY  ED MD interpretation:   Official radiology report(s): Dg Chest 2 View  Result Date: 05/28/2018 CLINICAL DATA:  Shortness of breath started last night. EXAM: CHEST - 2 VIEW COMPARISON:  CT chest 05/27/2018 FINDINGS: Large left pleural effusion with only a small area of aerated lung at the apex. Left paramediastinal pleural mass better visualized on CT chest performed 05/27/2018. Small right  pleural effusion. No pneumothorax. Stable cardiomediastinal silhouette. Right-sided Port-A-Cath in satisfactory position. No acute osseous abnormality. IMPRESSION: 1. Large left pleural effusion with only a small area of aerated lung at the apex. Small right pleural effusion. No interval change compared with 05/27/2018. Electronically Signed   By: Kathreen Devoid   On: 05/28/2018 12:35    ____________________________________________   PROCEDURES  Procedure(s) performed (including Critical Care):  Procedures   ____________________________________________   INITIAL IMPRESSION / ASSESSMENT AND PLAN / ED COURSE     Patient has had reaccumulation of her large left pleural effusion.  This is likely due to her cancer.  We will get her in the hospital have it drained work on her hyponatremia and see if she would be a candidate for pleurodesis          ____________________________________________   FINAL CLINICAL IMPRESSION(S) / ED DIAGNOSES  Final diagnoses:  Hypoxia  Hyponatremia  Shortness of breath  Pleural effusion     ED Discharge Orders    None       Note:  This document was  prepared using Dragon voice recognition software and may include unintentional dictation errors.    Nena Polio, MD 05/28/18 [phone]

## 2018-05-28 NOTE — H&P (Signed)
[name] NAME: Tanya Vega    MR#:  [id number]  DATE OF BIRTH:  1960-08-19  DATE OF ADMISSION:  05/28/2018  PRIMARY CARE PHYSICIAN: Tanya Body, MD   REQUESTING/REFERRING PHYSICIAN:   CHIEF COMPLAINT:   Chief Complaint  Patient presents with  . Shortness of Breath    HISTORY OF PRESENT ILLNESS: Tanya Vega  is a 58 y.o. female with a known history of metastatic melanoma, recent thoracentesis for malignant pleural effusion, presenting with acute shortness of breath via EMS with nonproductive cough, and emergency room patient was found to have large recurrence of her left pleural effusion on x-ray, sodium 122, chloride 89, patient valuated emergency room, husband at the bedside, patient is due to have appointment with Dr. 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  PAST MEDICAL HISTORY:   Past Medical History:  Diagnosis Date  . Hypothyroidism   . Melanoma of skin ([place]) 2011   Resected from Right neck area with 4 lymph nodes as well.   . Thyroid cancer ([place]) 2001   Partial thyroidectomy    PAST SURGICAL HISTORY:  Past Surgical History:  Procedure Laterality Date  . BREAST BIOPSY Right 2005   neg  . BREAST BIOPSY Left 07/11/2016   radial scar  . BREAST EXCISIONAL BIOPSY Left 08/06/2016   lumpectomy for radial scar  . BREAST LUMPECTOMY WITH NEEDLE LOCALIZATION Left 08/06/2016   Procedure: BREAST LUMPECTOMY WITH NEEDLE LOCALIZATION;  Surgeon: Tanya Green, MD;  Location: [facility];  Service: General;  Laterality: Left;  Marland Kitchen MELANOMA EXCISION  2011   ear  . PORTA CATH INSERTION N/A 03/14/2017   Procedure: PORTA CATH INSERTION;  Surgeon: Tanya Cabal, MD;  Location: [facility];  Service: Cardiovascular;  Laterality: N/A;  . THYROIDECTOMY, PARTIAL Left      SOCIAL HISTORY:  Social History   Tobacco Use  . Smoking status: Former Smoker    Packs/day: 0.25    Years: 20.00    Pack years: 5.00    Types: Cigarettes    Last attempt to quit: 03/26/1999    Years since quitting: 19.1  . Smokeless tobacco: Never Used  Substance Use Topics  . Alcohol use: Yes    Comment: WEEKENDS    FAMILY HISTORY:  Family History  Problem Relation Age of Onset  . Breast cancer Mother 54  . Breast cancer Paternal Aunt   . Colon cancer Brother 66    DRUG ALLERGIES:  Allergies  Allergen Reactions  . Tetanus-Diphtheria Toxoids Td Other (See Comments)    Sores on skin    REVIEW OF SYSTEMS:   CONSTITUTIONAL: No fever, fatigue or weakness.  EYES: No blurred or double vision.  EARS, NOSE, AND THROAT: No tinnitus or ear pain.  RESPIRATORY: + cough, shortness of breath CARDIOVASCULAR: No chest pain, orthopnea, edema.  GASTROINTESTINAL: No nausea, vomiting, diarrhea or abdominal pain.  GENITOURINARY: No dysuria, hematuria.  ENDOCRINE: No polyuria, nocturia,  HEMATOLOGY: No anemia, easy bruising or bleeding SKIN: No rash or lesion. MUSCULOSKELETAL: No joint pain or arthritis.   NEUROLOGIC: No tingling, numbness, weakness.  PSYCHIATRY: No anxiety or depression.   MEDICATIONS AT HOME:  Prior to Admission medications   Medication Sig Start Date End Date Taking? Authorizing Provider  Calcium Carbonate-Vitamin D (CALCIUM 500/VITAMIN D PO) Take 2 tablets by mouth daily.    Yes [provider]  levothyroxine (SYNTHROID, LEVOTHROID) 75 MCG tablet TAKE 1 TABLET (75 MCG TOTAL) BY MOUTH DAILY BEFORE BREAKFAST. 01/21/18  Yes Tanya Sickle, MD  lidocaine-prilocaine (EMLA) cream Apply 1 application topically as needed. 03/14/17  Yes Tanya Sickle, MD  MEKINIST 2 MG tablet TAKE 1 TABLET (2 MG TOTAL) BY MOUTH DAILY. TAKE 1 HOUR BEFORE OR 2 HOURS AFTER A MEAL. STORE REFRIGERATED IN ORIGINAL CONTAINER. Patient taking differently: Take 2 mg by  mouth daily.  02/17/18  Yes Tanya Sickle, MD  TAFINLAR 75 MG capsule TAKE 2 CAPSULES (150 MG TOTAL) BY MOUTH 2 (TWO) TIMES DAILY. TAKE ON AN EMPTY STOMACH 1 HOUR BEFORE OR 2 HOURS AFTER MEALS. Patient taking differently: Take 150 mg by mouth 2 (two) times daily.  02/17/18  Yes Tanya Sickle, MD      PHYSICAL EXAMINATION:   VITAL SIGNS: Blood pressure (!) 132/106, pulse (!) 113, temperature 98.4 F (36.9 C), temperature source Oral, resp. rate 18, height 5\' 4"  (1.626 m), weight 57.6 kg, last menstrual period 02/29/2012, SpO2 100 %.  GENERAL:  59 y.o.-year-old patient lying in the bed with no acute distress.  EYES: Pupils equal, round, reactive to light and accommodation. No scleral icterus. Extraocular muscles intact.  HEENT: Head atraumatic, normocephalic. Oropharynx and nasopharynx clear.  NECK:  Supple, no jugular venous distention. No thyroid enlargement, no tenderness.  LUNGS: Normal breath sounds bilaterally, no wheezing, rales,rhonchi or crepitation. No use of accessory muscles of respiration.  CARDIOVASCULAR: S1, S2 normal. No murmurs, rubs, or gallops.  ABDOMEN: Soft, nontender, nondistended. Bowel sounds present. No organomegaly or mass.  EXTREMITIES: No pedal edema, cyanosis, or clubbing.  NEUROLOGIC: Cranial nerves II through XII are intact. Muscle strength 5/5 in all extremities. Sensation intact. Gait not checked.  PSYCHIATRIC: The patient is alert and oriented x 3.  SKIN: No obvious rash, lesion, or ulcer.   LABORATORY PANEL:   CBC Recent Labs  Lab 05/28/18 1145  WBC 9.0  HGB 11.8*  HCT 35.8*  PLT 498*  MCV 84.4  MCH 27.8  MCHC 33.0  RDW 13.8  LYMPHSABS 1.2  MONOABS 0.8  EOSABS 0.0  BASOSABS 0.0   ------------------------------------------------------------------------------------------------------------------  Chemistries  Recent Labs  Lab 05/28/18 1145  NA 122*  K 3.7  CL 89*  CO2 21*  GLUCOSE 136*  BUN 11  CREATININE 0.62   CALCIUM 8.2*  AST 39  ALT 22  ALKPHOS 147*  BILITOT 0.3   ------------------------------------------------------------------------------------------------------------------ estimated creatinine clearance is 67.8 mL/min (by C-G formula based on SCr of 0.62 mg/dL). ------------------------------------------------------------------------------------------------------------------ No results for input(s): TSH, T4TOTAL, T3FREE, THYROIDAB in the last 72 hours.  Invalid input(s): FREET3   Coagulation profile No results for input(s): INR, PROTIME in the last 168 hours. ------------------------------------------------------------------------------------------------------------------- No results for input(s): DDIMER in the last 72 hours. -------------------------------------------------------------------------------------------------------------------  Cardiac Enzymes Recent Labs  Lab 05/28/18 1145  TROPONINI <0.03   ------------------------------------------------------------------------------------------------------------------ Invalid input(s): POCBNP  ---------------------------------------------------------------------------------------------------------------  Urinalysis    Component Value Date/Time   COLORURINE YELLOW 03/10/2018 1129   APPEARANCEUR CLEAR 03/10/2018 1129   LABSPEC 1.010 03/10/2018 1129   PHURINE 6.5 03/10/2018 1129   GLUCOSEU NEGATIVE 03/10/2018 1129   HGBUR NEGATIVE 03/10/2018 1129   BILIRUBINUR NEGATIVE 03/10/2018 1129   KETONESUR NEGATIVE 03/10/2018 1129   PROTEINUR NEGATIVE 03/10/2018 1129   NITRITE NEGATIVE 03/10/2018 1129   LEUKOCYTESUR NEGATIVE 03/10/2018 1129     RADIOLOGY: Dg Chest 2 View  Result Date: 05/28/2018 CLINICAL DATA:  Shortness of breath started last night.  EXAM: CHEST - 2 VIEW COMPARISON:  CT chest 05/27/2018 FINDINGS: Large left pleural effusion with only a small area of aerated lung at the apex. Left paramediastinal pleural mass  better visualized on CT chest performed 05/27/2018. Small right pleural effusion. No pneumothorax. Stable cardiomediastinal silhouette. Right-sided Port-A-Cath in satisfactory position. No acute osseous abnormality. IMPRESSION: 1. Large left pleural effusion with only a small area of aerated lung at the apex. Small right pleural effusion. No interval change compared with 05/27/2018. Electronically Signed   By: Kathreen Devoid   On: 05/28/2018 12:35   Ct Chest W Contrast  Result Date: 05/27/2018 CLINICAL DATA:  Recurrent stage IV metastatic melanoma originally diagnosed 2011. Ongoing medical therapy. Restaging. EXAM: CT CHEST, ABDOMEN, AND PELVIS WITH CONTRAST TECHNIQUE: Multidetector CT imaging of the chest, abdomen and pelvis was performed following the standard protocol during bolus administration of intravenous contrast. CONTRAST:  185mL OMNIPAQUE IOHEXOL 300 MG/ML  SOLN COMPARISON:  04/03/2018 CT chest, abdomen and pelvis. FINDINGS: CT CHEST FINDINGS Cardiovascular: Normal heart size. No significant pericardial effusion/thickening. Right internal jugular Port-A-Cath terminates in the lower third of the SVC. Atherosclerotic nonaneurysmal thoracic aorta. Normal caliber pulmonary arteries. No central pulmonary emboli. Mediastinum/Nodes: Stable subcentimeter hypodense right thyroid lobe nodule. Apparent left hemithyroidectomy. Unremarkable esophagus. No pathologically enlarged axillary, mediastinal or hilar lymph nodes. Lungs/Pleura: No pneumothorax. Small dependent right pleural effusion is new. Very large new left pleural effusion with associated complete left lower lobe and near complete left upper lobe atelectasis. There is extensive new irregular nodular plaque-like metastatic disease throughout the left pleural space measuring up to 5.6 x 2.9 cm in the medial upper left pleural space (series 2/image 16). Numerous (greater than 20) solid pulmonary nodules scattered throughout right lung, not appreciably  changed. Representative stable 1.1 cm right middle lobe nodule (series 3/image 100) and stable 9 mm basilar right lower lobe nodule (series 3/image 113). Musculoskeletal: Stable sclerotic T7 and T9 vertebral lesions. No new focal osseous lesions. CT ABDOMEN PELVIS FINDINGS Hepatobiliary: Normal liver size. Previously described vague hypoenhancing focus in the peripheral right liver lobe appears stable, measuring 1.7 x 1.4 cm (series 2/image 57), previously 1.6 x 1.4 cm using similar measurement technique. Poorly marginated hypoenhancing 2.5 x 2.0 cm anterior liver dome focus (series 2/image 50), increased from 1.1 x 0 9 cm. Numerous small low-attenuation circumscribed lesions scattered throughout the liver unchanged, largest 2.0 cm in the left liver lobe (series 2/image 62). No appreciable new liver lesions. Prominent diffuse gallbladder wall thickening is new. No radiopaque cholelithiasis. No pericholecystic fluid. No biliary ductal dilatation. Pancreas: Normal, with no mass or duct dilation. Spleen: Normal size. No mass. Adrenals/Urinary Tract: Normal adrenals. No hydronephrosis. Scattered tiny subcentimeter hypodense renal cortical lesions are too small to characterize. Normal bladder. Stomach/Bowel: Normal non-distended stomach. Normal caliber small bowel with no small bowel wall thickening. Normal appendix. Normal large bowel with no diverticulosis, large bowel wall thickening or pericolonic fat stranding. Vascular/Lymphatic: Atherosclerotic nonaneurysmal abdominal aorta. Patent portal, splenic, hepatic and renal veins. No pathologically enlarged lymph nodes in the abdomen or pelvis. Reproductive: Simple 2.1 cm left adnexal cyst is stable. No right adnexal mass. Stable mildly enlarged uterus with scattered coarse internal calcifications suggesting partially calcified uterine fibroids. Other: No pneumoperitoneum, ascites or focal fluid collection. Right lower quadrant 6 mm soft tissue nodule within the  peritoneal fat adjacent to right pelvic small bowel loops (series 2/image 105), not appreciably changed. Musculoskeletal: Stable large sclerotic L1 vertebral lesion with chronic severe L1 vertebral pathologic fracture. No  new focal osseous lesions. Moderate lower lumbar spondylosis. IMPRESSION: 1. Significant disease progression, predominantly due to new extensive left pleural metastatic disease with very large malignant left pleural effusion with associated near complete left lung atelectasis and with contralateral mediastinal shift. 2. Visualized right pulmonary metastases are stable. 3. Indistinct hypoenhancing liver dome lesion is increased, potentially indicating an enlarging liver metastasis. 4. Stable subcentimeter right lower quadrant peritoneal soft tissue implant. 5. Stable sclerotic spinal metastases. 6. New small dependent right pleural effusion. These results will be called to the ordering clinician or representative by the Radiologist Assistant, and communication documented in the PACS or zVision Dashboard. Electronically Signed   By: Ilona Sorrel M.D.   On: 05/27/2018 13:48   Ct Abdomen Pelvis W Contrast  Result Date: 05/27/2018 CLINICAL DATA:  Recurrent stage IV metastatic melanoma originally diagnosed 2011. Ongoing medical therapy. Restaging. EXAM: CT CHEST, ABDOMEN, AND PELVIS WITH CONTRAST TECHNIQUE: Multidetector CT imaging of the chest, abdomen and pelvis was performed following the standard protocol during bolus administration of intravenous contrast. CONTRAST:  154mL OMNIPAQUE IOHEXOL 300 MG/ML  SOLN COMPARISON:  04/03/2018 CT chest, abdomen and pelvis. FINDINGS: CT CHEST FINDINGS Cardiovascular: Normal heart size. No significant pericardial effusion/thickening. Right internal jugular Port-A-Cath terminates in the lower third of the SVC. Atherosclerotic nonaneurysmal thoracic aorta. Normal caliber pulmonary arteries. No central pulmonary emboli. Mediastinum/Nodes: Stable subcentimeter  hypodense right thyroid lobe nodule. Apparent left hemithyroidectomy. Unremarkable esophagus. No pathologically enlarged axillary, mediastinal or hilar lymph nodes. Lungs/Pleura: No pneumothorax. Small dependent right pleural effusion is new. Very large new left pleural effusion with associated complete left lower lobe and near complete left upper lobe atelectasis. There is extensive new irregular nodular plaque-like metastatic disease throughout the left pleural space measuring up to 5.6 x 2.9 cm in the medial upper left pleural space (series 2/image 16). Numerous (greater than 20) solid pulmonary nodules scattered throughout right lung, not appreciably changed. Representative stable 1.1 cm right middle lobe nodule (series 3/image 100) and stable 9 mm basilar right lower lobe nodule (series 3/image 113). Musculoskeletal: Stable sclerotic T7 and T9 vertebral lesions. No new focal osseous lesions. CT ABDOMEN PELVIS FINDINGS Hepatobiliary: Normal liver size. Previously described vague hypoenhancing focus in the peripheral right liver lobe appears stable, measuring 1.7 x 1.4 cm (series 2/image 57), previously 1.6 x 1.4 cm using similar measurement technique. Poorly marginated hypoenhancing 2.5 x 2.0 cm anterior liver dome focus (series 2/image 50), increased from 1.1 x 0 9 cm. Numerous small low-attenuation circumscribed lesions scattered throughout the liver unchanged, largest 2.0 cm in the left liver lobe (series 2/image 62). No appreciable new liver lesions. Prominent diffuse gallbladder wall thickening is new. No radiopaque cholelithiasis. No pericholecystic fluid. No biliary ductal dilatation. Pancreas: Normal, with no mass or duct dilation. Spleen: Normal size. No mass. Adrenals/Urinary Tract: Normal adrenals. No hydronephrosis. Scattered tiny subcentimeter hypodense renal cortical lesions are too small to characterize. Normal bladder. Stomach/Bowel: Normal non-distended stomach. Normal caliber small bowel with  no small bowel wall thickening. Normal appendix. Normal large bowel with no diverticulosis, large bowel wall thickening or pericolonic fat stranding. Vascular/Lymphatic: Atherosclerotic nonaneurysmal abdominal aorta. Patent portal, splenic, hepatic and renal veins. No pathologically enlarged lymph nodes in the abdomen or pelvis. Reproductive: Simple 2.1 cm left adnexal cyst is stable. No right adnexal mass. Stable mildly enlarged uterus with scattered coarse internal calcifications suggesting partially calcified uterine fibroids. Other: No pneumoperitoneum, ascites or focal fluid collection. Right lower quadrant 6 mm soft tissue nodule within the peritoneal fat adjacent  to right pelvic small bowel loops (series 2/image 105), not appreciably changed. Musculoskeletal: Stable large sclerotic L1 vertebral lesion with chronic severe L1 vertebral pathologic fracture. No new focal osseous lesions. Moderate lower lumbar spondylosis. IMPRESSION: 1. Significant disease progression, predominantly due to new extensive left pleural metastatic disease with very large malignant left pleural effusion with associated near complete left lung atelectasis and with contralateral mediastinal shift. 2. Visualized right pulmonary metastases are stable. 3. Indistinct hypoenhancing liver dome lesion is increased, potentially indicating an enlarging liver metastasis. 4. Stable subcentimeter right lower quadrant peritoneal soft tissue implant. 5. Stable sclerotic spinal metastases. 6. New small dependent right pleural effusion. These results will be called to the ordering clinician or representative by the Radiologist Assistant, and communication documented in the PACS or zVision Dashboard. Electronically Signed   By: Ilona Sorrel M.D.   On: 05/27/2018 13:48    EKG: Orders placed or performed during the hospital encounter of 05/28/18  . EKG 12-Lead  . EKG 12-Lead  . ED EKG  . ED EKG    IMPRESSION AND PLAN: *Acute hypoxic  respiratory failure Most likely secondary to recurrent malignant pleural effusion reaccumulation Admit to regular nursing floor bed, supplemental oxygen wean as tolerated, consult IR for thoracentesis, cardiothoracic surgery for evaluation for possible Pleurx catheter placement given rapid reaccumulation  *Acute recurrent malignant left pleural effusion Secondary to melanoma Plan of care as stated above Continue oral chemotherapy agents, followed by Dr. 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 Management plans discussed with the patient, family and they are in agreement.  CODE STATUS:dnr Advance Directive Documentation     Most Recent Value  Type of Advance Directive  Healthcare Power of Attorney, Living will  Pre-existing out of facility DNR order (yellow form or pink MOST form)  -  "MOST" Form in Place?  -       TOTAL TIME TAKING CARE OF THIS PATIENT: 45 minutes.    Avel Peace Salary M.D on 05/28/2018   Between 7am to 6pm - Pager - [phone]  After 6pm go to [email or web address] - password [email or web address] [facility]  Office  [phone]  CC: Primary care physician; Tanya Body, MD   Note: This dictation was prepared with Dragon dictation along with smaller phrase technology. Any transcriptional errors that result from this process are unintentional.

## 2018-05-28 NOTE — ED Triage Notes (Signed)
Pt to ED via [facility] from home c/o SOB that started last night, hx lung CA.  Was found to be 85% RA by fire department and placed on 2L [place] up to 94%, pt dropped oxygen sats again and placed on 4L [place] by EMS up to 96%.  Presents A&Ox4, speaking in complete and coherent sentences, skin WNL.

## 2018-05-28 NOTE — Progress Notes (Signed)
Family Meeting Note  Advance Directive:yes  Today a meeting took place with the Patient.  Patient is able to participate   The following clinical team members were present during this meeting:MD  The following were discussed:Patient's diagnosis: Melanoma, Patient's progosis: Unable to determine and Goals for treatment: DNR  Additional follow-up to be provided: prn  Time spent during discussion:20 minutes  Gorden Harms, MD

## 2018-05-28 NOTE — Progress Notes (Signed)
Patient is scheduled to see Dr. Genevive Bi in the office tomorrow, 05-29-18.   He wanted patient to go ahead and be placed on the surgery schedule for next week. Patient's surgery has been scheduled for 06-03-18 at [facility].   The patient will need to Pre-Admit on 05-29-18 at 12 noon. Patient will check in at the [facility], [address] (first floor).   Patient is unaware of surgery date and pre-admission appointment at this time. She will be informed of this after she speaks with Dr. Genevive Bi tomorrow.   I did call and speak with Corene Cornea from [facility] about [facility] for the patient. He is unsure if they will be able to accept this patient as he will need to get branch approval. He will be in touch with our office.

## 2018-05-28 NOTE — H&P (View-Only) (Signed)
Patient ID: Tanya Vega, female   DOB: 1961-01-10, 58 y.o.   MRN: [id number]  Chief Complaint  Patient presents with  . Shortness of Breath    Referred By Dr. Fabienne Bruns Reason for Referral metastatic melanoma with malignant left pleural effusion  HPI Location, Quality, Duration, Severity, Timing, Context, Modifying Factors, Associated Signs and Symptoms.  Tanya Vega is a 58 y.o. female.  Tanya Vega is a 58 year old female who presents to the emergency department this afternoon with increasing shortness of breath.  Patient recently was seen a few days ago where she underwent a left-sided thoracentesis with immediate relief of her symptoms of shortness of breath.  She was scheduled to follow-up with Dr. Lynett Fish tomorrow but became increasingly short of breath and presented to the emergency department.  Her chief complaint at this time is shortness of breath that is not relieved with any specific activities.  With exertion she does become more short of breath.  She has had no fevers or chills.  She states that her appetite is been good except for the last few days.  She had significant relief of her shortness of breath last week when I did her thoracentesis of a little over a liter.  Her CT scan was independently reviewed by me with Dr. Pascal Lux as well as her ultrasound today.  That reveals a large left-sided pleural effusion as well as a smaller right-sided pleural effusion.  I was with the patient today when she underwent a thoracentesis.  Approximately 1.6 L of fluid was removed.  She did experience some coughing and felt better with that.   Past Medical History:  Diagnosis Date  . Hypothyroidism   . Melanoma of skin ([place]) 2011   Resected from Right neck area with 4 lymph nodes as well.   . Thyroid cancer [facility]) 2001   Partial thyroidectomy    Past Surgical History:  Procedure Laterality Date  . BREAST BIOPSY Right 2005   neg  . BREAST BIOPSY Left 07/11/2016   radial scar  . BREAST  EXCISIONAL BIOPSY Left 08/06/2016   lumpectomy for radial scar  . BREAST LUMPECTOMY WITH NEEDLE LOCALIZATION Left 08/06/2016   Procedure: BREAST LUMPECTOMY WITH NEEDLE LOCALIZATION;  Surgeon: Leonie Green, MD;  Location: [facility];  Service: General;  Laterality: Left;  Marland Kitchen MELANOMA EXCISION  2011   ear  . PORTA CATH INSERTION N/A 03/14/2017   Procedure: PORTA CATH INSERTION;  Surgeon: Katha Cabal, MD;  Location: [facility];  Service: Cardiovascular;  Laterality: N/A;  . THYROIDECTOMY, PARTIAL Left     Family History  Problem Relation Age of Onset  . Breast cancer Mother 63  . Breast cancer Paternal Aunt   . Colon cancer Brother 17    Social History Social History   Tobacco Use  . Smoking status: Former Smoker    Packs/day: 0.25    Years: 20.00    Pack years: 5.00    Types: Cigarettes    Last attempt to quit: 03/26/1999    Years since quitting: 19.1  . Smokeless tobacco: Never Used  Substance Use Topics  . Alcohol use: Yes    Comment: WEEKENDS  . Drug use: No    Allergies  Allergen Reactions  . Tetanus-Diphtheria Toxoids Td Other (See Comments)    Sores on skin    No current facility-administered medications for this encounter.    Current Outpatient Medications  Medication Sig Dispense Refill  . Calcium Carbonate-Vitamin D (CALCIUM 500/VITAMIN D PO) Take 2 tablets  by mouth daily.     Marland Kitchen levothyroxine (SYNTHROID, LEVOTHROID) 75 MCG tablet TAKE 1 TABLET (75 MCG TOTAL) BY MOUTH DAILY BEFORE BREAKFAST. 90 tablet 2  . lidocaine-prilocaine (EMLA) cream Apply 1 application topically as needed. 30 g 4  . MEKINIST 2 MG tablet TAKE 1 TABLET (2 MG TOTAL) BY MOUTH DAILY. TAKE 1 HOUR BEFORE OR 2 HOURS AFTER A MEAL. STORE REFRIGERATED IN ORIGINAL CONTAINER. (Patient taking differently: Take 2 mg by mouth daily. ) 30 tablet 4  . TAFINLAR 75 MG capsule TAKE 2 CAPSULES (150 MG TOTAL) BY MOUTH 2 (TWO) TIMES DAILY. TAKE ON AN EMPTY STOMACH 1 HOUR BEFORE OR 2 HOURS  AFTER MEALS. (Patient taking differently: Take 150 mg by mouth 2 (two) times daily. ) 120 capsule 4      Review of Systems A complete review of systems was asked and was negative except for the following positive findings increasing shortness of breath.  Diminished appetite.  Blood pressure (!) 141/77, pulse (!) 105, temperature 98.4 F (36.9 C), temperature source Oral, resp. rate (!) 23, height 5\' 4"  (1.626 m), weight 57.6 kg, last menstrual period 02/29/2012, SpO2 100 %.  Physical Exam CONSTITUTIONAL:  Pleasant, well-developed, well-nourished, and in no acute distress. EYES: Pupils equal and reactive to light, Sclera non-icteric EARS, NOSE, MOUTH AND THROAT:  The oropharynx was clear.  Dentition is good repair.  Oral mucosa pink and moist. LYMPH NODES:  Lymph nodes in the neck and axillae were normal RESPIRATORY:  Lungs were clear on the right but there are absent breath sounds on the left.  Normal respiratory effort without pathologic use of accessory muscles of respiration CARDIOVASCULAR: Heart was regular without murmurs.  There were no carotid bruits. GI: The abdomen was soft, nontender, and nondistended. There were no palpable masses. There was no hepatosplenomegaly. There were normal bowel sounds in all quadrants. GU:  Rectal deferred.   MUSCULOSKELETAL:  Normal muscle strength and tone.  No clubbing or cyanosis.   SKIN:  There were no pathologic skin lesions.  There were no nodules on palpation. NEUROLOGIC:  Sensation is normal.  Cranial nerves are grossly intact. PSYCH:  Oriented to person, place and time.  Mood and affect are normal.  Data Reviewed CT scan and chest x-ray  I have personally reviewed the patient's imaging, laboratory findings and medical records.    Assessment    Malignant pleural effusion left side    Plan    I had a long discussion with her and her family today.  I discussed her care with Dr. Lynett Fish.    I have explained to her and her husband  that my current recommendation would be to proceed with Pleurx catheter insertion.  They understand the rationale for this.  I explained to them the indications and risks of surgery.  I explained to them that the catheter although considered permanent may be removed in the future if she so desires are no longer needs it.  All her questions were answered.       Nestor Lewandowsky, MD 05/28/2018, 4:01 PM   Patient ID: Tanya Vega, female   DOB: 1960/07/25, 58 y.o.   MRN: [id number]

## 2018-05-29 ENCOUNTER — Ambulatory Visit: Admit: 2018-05-29 | Payer: BC Managed Care – PPO | Admitting: Cardiothoracic Surgery

## 2018-05-29 ENCOUNTER — Inpatient Hospital Stay: Payer: BC Managed Care – PPO

## 2018-05-29 ENCOUNTER — Inpatient Hospital Stay: Payer: BC Managed Care – PPO | Admitting: Internal Medicine

## 2018-05-29 ENCOUNTER — Ambulatory Visit: Admission: RE | Admit: 2018-05-29 | Payer: BC Managed Care – PPO | Source: Ambulatory Visit

## 2018-05-29 ENCOUNTER — Inpatient Hospital Stay: Payer: BC Managed Care – PPO | Admitting: Anesthesiology

## 2018-05-29 ENCOUNTER — Ambulatory Visit: Payer: BC Managed Care – PPO | Admitting: Cardiothoracic Surgery

## 2018-05-29 ENCOUNTER — Encounter: Payer: Self-pay | Admitting: Anesthesiology

## 2018-05-29 ENCOUNTER — Encounter
Admission: EM | Disposition: A | Discharge status: Home Health | Payer: Self-pay | Source: Home / Self Care | Attending: Internal Medicine

## 2018-05-29 ENCOUNTER — Inpatient Hospital Stay: Admission: RE | Admit: 2018-05-29 | Payer: BC Managed Care – PPO | Source: Ambulatory Visit

## 2018-05-29 ENCOUNTER — Ambulatory Visit: Payer: Self-pay | Admitting: Cardiothoracic Surgery

## 2018-05-29 DIAGNOSIS — Z515 Encounter for palliative care: Secondary | ICD-10-CM

## 2018-05-29 DIAGNOSIS — C787 Secondary malignant neoplasm of liver and intrahepatic bile duct: Secondary | ICD-10-CM

## 2018-05-29 DIAGNOSIS — C7931 Secondary malignant neoplasm of brain: Secondary | ICD-10-CM

## 2018-05-29 DIAGNOSIS — Z66 Do not resuscitate: Secondary | ICD-10-CM

## 2018-05-29 DIAGNOSIS — C439 Malignant melanoma of skin, unspecified: Secondary | ICD-10-CM

## 2018-05-29 DIAGNOSIS — J90 Pleural effusion, not elsewhere classified: Secondary | ICD-10-CM

## 2018-05-29 HISTORY — PX: CHEST TUBE INSERTION: SHX231

## 2018-05-29 LAB — BASIC METABOLIC PANEL
ANION GAP: 8 (ref 5–15)
BUN: 11 mg/dL (ref 6–20)
CO2: 24 mmol/L (ref 22–32)
Calcium: 7.5 mg/dL — ABNORMAL LOW (ref 8.9–10.3)
Chloride: 100 mmol/L (ref 98–111)
Creatinine, Ser: 0.61 mg/dL (ref 0.44–1.00)
GFR calc Af Amer: >60 mL/min (ref >60)
GFR calc non Af Amer: >60 mL/min (ref >60)
GLUCOSE: 117 mg/dL — AB (ref 70–99)
Potassium: 3.7 mmol/L (ref 3.5–5.1)
Sodium: 132 mmol/L — ABNORMAL LOW (ref 135–145)

## 2018-05-29 SURGERY — CHEST TUBE INSERTION
Anesthesia: General

## 2018-05-29 MED ORDER — LIDOCAINE HCL (PF) 1 % IJ SOLN
INTRAMUSCULAR | Status: DC | PRN
  Administered 2018-05-29: 10 mL

## 2018-05-29 MED ORDER — MIDAZOLAM HCL 2 MG/2ML IJ SOLN
INTRAMUSCULAR | Status: DC | PRN
  Administered 2018-05-29: 2 mg via INTRAVENOUS

## 2018-05-29 MED ORDER — SODIUM CHLORIDE 0.9 % IV SOLN
INTRAVENOUS | Status: DC | PRN
  Administered 2018-05-29: 20 ug/min via INTRAVENOUS

## 2018-05-29 MED ORDER — ALBUTEROL SULFATE (2.5 MG/3ML) 0.083% IN NEBU: 2.5000 mg | INHALATION_SOLUTION | Freq: Two times a day (BID) | RESPIRATORY_TRACT | Status: DC

## 2018-05-29 MED ORDER — PROPOFOL 10 MG/ML IV BOLUS
INTRAVENOUS | Status: DC | PRN
  Administered 2018-05-29: 30 mg via INTRAVENOUS
  Administered 2018-05-29: 20 mg via INTRAVENOUS
  Administered 2018-05-29: 10 mg via INTRAVENOUS
  Administered 2018-05-29: 20 mg via INTRAVENOUS

## 2018-05-29 MED ORDER — ROCURONIUM BROMIDE 50 MG/5ML IV SOLN
INTRAVENOUS | Status: AC
  Filled 2018-05-29: qty 1

## 2018-05-29 MED ORDER — ALBUTEROL SULFATE (2.5 MG/3ML) 0.083% IN NEBU: 2.5000 mg | INHALATION_SOLUTION | Freq: Four times a day (QID) | RESPIRATORY_TRACT | Status: DC | PRN

## 2018-05-29 MED ORDER — PHENYLEPHRINE HCL 10 MG/ML IJ SOLN
INTRAMUSCULAR | Status: DC | PRN
  Administered 2018-05-29 (×2): 100 ug via INTRAVENOUS
  Administered 2018-05-29: 200 ug via INTRAVENOUS
  Administered 2018-05-29: 100 ug via INTRAVENOUS
  Administered 2018-05-29: 200 ug via INTRAVENOUS
  Administered 2018-05-29 (×2): 100 ug via INTRAVENOUS

## 2018-05-29 MED ORDER — OXYCODONE HCL 5 MG/5ML PO SOLN: 5.0000 mg | Freq: Once | ORAL | Status: DC | PRN

## 2018-05-29 MED ORDER — FENTANYL CITRATE (PF) 250 MCG/5ML IJ SOLN
INTRAMUSCULAR | Status: AC
  Filled 2018-05-29: qty 5

## 2018-05-29 MED ORDER — FENTANYL CITRATE (PF) 100 MCG/2ML IJ SOLN: 25.0000 ug | INTRAMUSCULAR | Status: DC | PRN

## 2018-05-29 MED ORDER — MIDAZOLAM HCL 2 MG/2ML IJ SOLN
INTRAMUSCULAR | Status: AC
  Filled 2018-05-29: qty 2

## 2018-05-29 MED ORDER — LACTATED RINGERS IV SOLN
INTRAVENOUS | Status: DC | PRN
  Administered 2018-05-29: 13:00:00 via INTRAVENOUS

## 2018-05-29 MED ORDER — PROPOFOL 10 MG/ML IV BOLUS
INTRAVENOUS | Status: AC
  Filled 2018-05-29: qty 20

## 2018-05-29 MED ORDER — OXYCODONE HCL 5 MG PO TABS: 5.0000 mg | ORAL_TABLET | Freq: Once | ORAL | Status: DC | PRN

## 2018-05-29 MED ORDER — PROPOFOL 500 MG/50ML IV EMUL
INTRAVENOUS | Status: DC | PRN
  Administered 2018-05-29: 20 ug/kg/min via INTRAVENOUS

## 2018-05-29 MED ORDER — FENTANYL CITRATE (PF) 100 MCG/2ML IJ SOLN
INTRAMUSCULAR | Status: DC | PRN
  Administered 2018-05-29 (×2): 25 ug via INTRAVENOUS

## 2018-05-29 SURGICAL SUPPLY — 46 items
BLADE SURG SZ11 CARB STEEL (BLADE) ×2 IMPLANT
CANISTER SUCT 1200ML W/VALVE (MISCELLANEOUS) ×2 IMPLANT
CHLORAPREP W/TINT 26ML (MISCELLANEOUS) ×2 IMPLANT
DERMABOND ADVANCED (GAUZE/BANDAGES/DRESSINGS) ×1
DERMABOND ADVANCED .7 DNX12 (GAUZE/BANDAGES/DRESSINGS) IMPLANT
DRAIN CHEST DRY SUCT SGL (MISCELLANEOUS) ×2 IMPLANT
DRAPE INCISE IOBAN 66X45 STRL (DRAPES) ×2 IMPLANT
DRAPE LAPAROTOMY 77X122 PED (DRAPES) ×2 IMPLANT
DRSG OPSITE POSTOP 3X4 (GAUZE/BANDAGES/DRESSINGS) IMPLANT
ELECT REM PT RETURN 9FT ADLT (ELECTROSURGICAL) ×2
ELECTRODE REM PT RTRN 9FT ADLT (ELECTROSURGICAL) ×1 IMPLANT
GLOVE BIO SURGEON STRL SZ 6.5 (GLOVE) ×3 IMPLANT
GLOVE BIOGEL PI IND STRL 6.5 (GLOVE) IMPLANT
GLOVE BIOGEL PI INDICATOR 6.5 (GLOVE) ×3
GLOVE SURG SYN 7.5  E (GLOVE) ×1
GLOVE SURG SYN 7.5 E (GLOVE) ×1 IMPLANT
GLOVE SURG SYN 7.5 PF PI (GLOVE) ×1 IMPLANT
GOWN STRL REUS W/ TWL LRG LVL3 (GOWN DISPOSABLE) ×2 IMPLANT
GOWN STRL REUS W/TWL LRG LVL3 (GOWN DISPOSABLE) ×4
KIT PLEURX DRAIN CATH 15.5FR (DRAIN) ×2 IMPLANT
KIT TURNOVER KIT A (KITS) ×2 IMPLANT
LABEL OR SOLS (LABEL) ×2 IMPLANT
MARKER SKIN DUAL TIP RULER LAB (MISCELLANEOUS) ×4 IMPLANT
NDL FILTER BLUNT 18X1 1/2 (NEEDLE) ×1 IMPLANT
NEEDLE FILTER BLUNT 18X 1/2SAF (NEEDLE) ×1
NEEDLE FILTER BLUNT 18X1 1/2 (NEEDLE) ×1 IMPLANT
PACK BASIN MINOR ARMC (MISCELLANEOUS) ×2 IMPLANT
STRIP CLOSURE SKIN 1/2X4 (GAUZE/BANDAGES/DRESSINGS) IMPLANT
SUCTION FRAZIER HANDLE 10FR (MISCELLANEOUS) ×1
SUCTION TUBE FRAZIER 10FR DISP (MISCELLANEOUS) ×1 IMPLANT
SUT ETHILON 3-0 FS-10 30 BLK (SUTURE) ×2
SUT ETHILON 4-0 (SUTURE)
SUT ETHILON 4-0 FS2 18XMFL BLK (SUTURE)
SUT MNCRL 4-0 (SUTURE) ×1
SUT MNCRL 4-0 27XMFL (SUTURE) ×1
SUT SILK 1 SH (SUTURE) ×2 IMPLANT
SUT VIC AB 0 SH 27 (SUTURE) ×2 IMPLANT
SUT VIC AB 2-0 SH 27 (SUTURE) ×1
SUT VIC AB 2-0 SH 27XBRD (SUTURE) ×1 IMPLANT
SUT VIC AB 3-0 SH 27 (SUTURE)
SUT VIC AB 3-0 SH 27X BRD (SUTURE) IMPLANT
SUTURE EHLN 3-0 FS-10 30 BLK (SUTURE) ×1 IMPLANT
SUTURE ETHLN 4-0 FS2 18XMF BLK (SUTURE) IMPLANT
SUTURE MNCRL 4-0 27XMF (SUTURE) IMPLANT
SYR 30ML LL (SYRINGE) ×2 IMPLANT
TAPE CLOTH 3X10 WHT NS LF (GAUZE/BANDAGES/DRESSINGS) ×2 IMPLANT

## 2018-05-29 NOTE — Anesthesia Preprocedure Evaluation (Addendum)
Anesthesia Evaluation  Patient identified by MRN, date of birth, ID band Patient awake    Reviewed: Allergy & Precautions, NPO status , Patient's Chart, lab work & pertinent test results  History of Anesthesia Complications Negative for: history of anesthetic complications  Airway Mallampati: II  TM Distance: <3 FB Neck ROM: full    Dental  (+) Teeth Intact   Pulmonary former smoker,  Recurrent pleural effusions related to metastatic melanoma          Cardiovascular negative cardio ROS       Neuro/Psych negative neurological ROS  negative psych ROS   GI/Hepatic negative GI ROS, Neg liver ROS,   Endo/Other  negative endocrine ROSHypothyroidism (s/p thyroid cancer)   Renal/GU negative Renal ROS     Musculoskeletal   Abdominal   Peds  Hematology negative hematology ROS (+)   Anesthesia Other Findings 58 year old female with history of metastatic melanoma with malignant left recurrent pleural effusion.  Drained 2L off via thoracentesis, presenting for chest tube and Pleurx catheter insertion.  Pt is DNR/DNI  Past Medical History: No date: Hypothyroidism 2011: Melanoma of skin ([place])     Comment:  Resected from Right neck area with 4 lymph nodes as               well.  2001: Thyroid cancer ([place])     Comment:  Partial thyroidectomy    Reproductive/Obstetrics                         Anesthesia Physical  Anesthesia Plan  ASA: IV  Anesthesia Plan: General   Post-op Pain Management:    Induction: Intravenous  PONV Risk Score and Plan: Propofol infusion  Airway Management Planned: Natural Airway and Simple Face Mask  Additional Equipment:   Intra-op Plan:   Post-operative Plan:   Informed Consent: I have reviewed the patients History and Physical, chart, labs and discussed the procedure including the risks, benefits and alternatives for the proposed anesthesia with the patient or  authorized representative who has indicated his/her understanding and acceptance.       Plan Discussed with:   Anesthesia Plan Comments: (Plan propofol titrated just to unconsciousness, maintain native airway as patient is DNR/DNI)     Anesthesia Quick Evaluation

## 2018-05-29 NOTE — Consult Note (Signed)
[facility]  Telephone:([phone] [phone]   Name: Tanya Vega Date: 05/29/2018 MRN: [id number]  DOB: Nov 20, 1960  Patient Care Team: Dion Body, MD as PCP - General (Family Medicine)    REASON FOR CONSULTATION: Palliative Care consult requested for this 58 y.o. 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  Patient has had disease progression on Dabrafenib+Mekinist.  She was admitted to the hospital on 05/28/2018 with shortness of breath.  She was found to have recurrence of a large left pleural effusion on x-ray.  Patient has had therapeutic thoracentesis in the past.  She was evaluated by CVTS and Pleurx was inserted on 05/29/2018.  Palliative care was consulted to help address goals in the setting of disease progression.  SOCIAL HISTORY:    Patient is married and lives at home with her husband.  She has a daughter who lives in [place] and is involved in her care.  Patient formally worked for the [employer].  ADVANCE DIRECTIVES:  Living will on file dated 08/07/2016  CODE STATUS: DNR  PAST MEDICAL HISTORY: Past Medical History:  Diagnosis Date  . Hypothyroidism   . Melanoma of skin ([place]) 2011   Resected from Right neck area with 4 lymph nodes as well.   . Thyroid cancer ([place]) 2001   Partial thyroidectomy    PAST SURGICAL HISTORY:  Past Surgical History:  Procedure Laterality Date  . BREAST BIOPSY Right 2005   neg  . BREAST BIOPSY Left 07/11/2016   radial scar  . BREAST EXCISIONAL BIOPSY Left 08/06/2016   lumpectomy for radial scar  . BREAST LUMPECTOMY WITH NEEDLE LOCALIZATION Left 08/06/2016   Procedure: BREAST LUMPECTOMY WITH NEEDLE LOCALIZATION;  Surgeon: Leonie Green, MD;  Location: [facility] OR[facility];  Service: General;  Laterality: Left;  Marland Kitchen MELANOMA EXCISION  2011   ear  . PORTA CATH INSERTION N/A 03/14/2017   Procedure:  PORTA CATH INSERTION;  Surgeon: Katha Cabal, MD;  Location: [facility];  Service: Cardiovascular;  Laterality: N/A;  . THYROIDECTOMY, PARTIAL Left     HEMATOLOGY/ONCOLOGY HISTORY:  Oncology History   # NOV 2018-RECURRENT METASTATIC MELANOMA MULTIPLE LIVER LESIONS/lung lesions [s/p Bx on Nov 30th]; DEc 2018- PET-multiple lung liver bone metastasis.   # Dec 11th,2018- ipi+Nivo s/p 2 cycles;  # Jan 23rd 2019 CT scan- Progression; Jan 2019- MRI brain- improved SBRT lesion; worsening 9 mm left parietal; new 2 mm right parietal; July 23rd MRI-Improved  #April 19, 2017- Dabrafenib+Mekinist; March 24th- significant PR.   # Dec 1st 2018- Brain mets-Right frontal 62m [s/p SBRT- dec 21st]; and 2 other 2-4 mm. March 2019- Improved; AUG 2019- Progression of Lung nodules.   # SBRT LUL nodule [[facility]; % fx- finished 02/13/2018 ]  # Bone mets- X-geva [12/04]; s/p RT [Dr.Crystal;dec 2017]  # Hx of Melanoma- dec 2011 [SLNBx- 0/4-Neg;[facility] ]; Hx of thyroid cancer- feb 2001- s/p left throid lobectomy [[facility]]; NO RAIU.  # Molecular testing: Foundation: **BRAF V600E mutation; TMB-H; MSS; -------------------------------------------------------------------  DIAGNOSIS: MELANOMA- B-raf pos  STAGE:   IV   ;GOALS: palliative  CURRENT/MOST RECENT THERAPY- dab+mek     Metastatic melanoma to liver (HCC)    ALLERGIES:  is allergic to tetanus-diphtheria toxoids td.  MEDICATIONS:  Current Facility-Administered Medications  Medication Dose Route Frequency Provider Last Rate Last Dose  . acetaminophen (TYLENOL) tablet 650 mg  650 mg Oral Q6H PRN Salary, Montell D,  MD       Or  . acetaminophen (TYLENOL) suppository 650 mg  650 mg Rectal Q6H PRN Salary, Montell D, MD      . albuterol (PROVENTIL) (2.5 MG/3ML) 0.083% nebulizer solution 2.5 mg  2.5 mg Nebulization BID Mody, Sital, MD      . albuterol (PROVENTIL) (2.5 MG/3ML) 0.083% nebulizer solution 2.5 mg  2.5 mg Nebulization Q6H PRN Mody,  Sital, MD      . calcium-vitamin D (OSCAL WITH D) 500-200 MG-UNIT per tablet 1 tablet  1 tablet Oral Daily Salary, Montell D, MD      . dabrafenib mesylate (TAFINLAR) capsule 150 mg  150 mg Oral BID Salary, Montell D, MD      . dextrose 5 %-0.9 % sodium chloride infusion   Intravenous Continuous Salary, Montell D, MD 75 mL/hr at 05/29/18 0602    . HYDROcodone-acetaminophen (NORCO/VICODIN) 5-325 MG per tablet 1 tablet  1 tablet Oral Q4H PRN Salary, Montell D, MD      . levothyroxine (SYNTHROID, LEVOTHROID) tablet 75 mcg  75 mcg Oral QAC breakfast Salary, Montell D, MD      . lidocaine-prilocaine (EMLA) cream 1 application  1 application Topical PRN Salary, Montell D, MD      . ondansetron (ZOFRAN) tablet 4 mg  4 mg Oral Q6H PRN Salary, Montell D, MD       Or  . ondansetron (ZOFRAN) injection 4 mg  4 mg Intravenous Q6H PRN Salary, Montell D, MD      . polyethylene glycol (MIRALAX / GLYCOLAX) packet 17 g  17 g Oral Daily PRN Salary, Montell D, MD      . sodium chloride tablet 2 g  2 g Oral TID WC Salary, Montell D, MD   2 g at 05/28/18 1711  . trametinib dimethyl sulfoxide (MEKINIST) tablet 2 mg  2 mg Oral Daily Salary, Montell D, MD        VITAL SIGNS: BP 106/80 (BP Location: Left Arm)   Pulse 93   Temp 97.9 F (36.6 C)   Resp (!) 21   Ht '5\' 4"'  (1.626 m)   Wt 137 lb 9.1 oz (62.4 kg)   LMP 02/29/2012 (Approximate)   SpO2 96%   BMI 23.61 kg/m  Filed Weights   05/28/18 1139 05/28/18 1631 05/29/18 1210  Weight: 127 lb (57.6 kg) 137 lb 9.1 oz (62.4 kg) 137 lb 9.1 oz (62.4 kg)    Estimated body mass index is 23.61 kg/m as calculated from the following:   Height as of this encounter: '5\' 4"'  (1.626 m).   Weight as of this encounter: 137 lb 9.1 oz (62.4 kg).  LABS: CBC:    Component Value Date/Time   WBC 9.0 05/28/2018 1145   HGB 11.8 (L) 05/28/2018 1145   HCT 35.8 (L) 05/28/2018 1145   PLT 498 (H) 05/28/2018 1145   MCV 84.4 05/28/2018 1145   NEUTROABS 6.9 05/28/2018 1145    LYMPHSABS 1.2 05/28/2018 1145   MONOABS 0.8 05/28/2018 1145   EOSABS 0.0 05/28/2018 1145   BASOSABS 0.0 05/28/2018 1145   Comprehensive Metabolic Panel:    Component Value Date/Time   NA 132 (L) 05/29/2018 0401   K 3.7 05/29/2018 0401   CL 100 05/29/2018 0401   CO2 24 05/29/2018 0401   BUN 11 05/29/2018 0401   CREATININE 0.61 05/29/2018 0401   GLUCOSE 117 (H) 05/29/2018 0401   CALCIUM 7.5 (L) 05/29/2018 0401   AST 39 05/28/2018 1145   ALT 22 05/28/2018  1145   ALKPHOS 147 (H) 05/28/2018 1145   BILITOT 0.3 05/28/2018 1145   PROT 6.0 (L) 05/28/2018 1145   ALBUMIN 2.4 (L) 05/28/2018 1145    RADIOGRAPHIC STUDIES: Dg Chest 2 View  Result Date: 05/28/2018 CLINICAL DATA:  Shortness of breath started last night. EXAM: CHEST - 2 VIEW COMPARISON:  CT chest 05/27/2018 FINDINGS: Large left pleural effusion with only a small area of aerated lung at the apex. Left paramediastinal pleural mass better visualized on CT chest performed 05/27/2018. Small right pleural effusion. No pneumothorax. Stable cardiomediastinal silhouette. Right-sided Port-A-Cath in satisfactory position. No acute osseous abnormality. IMPRESSION: 1. Large left pleural effusion with only a small area of aerated lung at the apex. Small right pleural effusion. No interval change compared with 05/27/2018. Electronically Signed   By: Kathreen Devoid   On: 05/28/2018 12:35   Dg Chest 2 View  Result Date: 05/22/2018 CLINICAL DATA:  Status post left thoracentesis. EXAM: CHEST - 2 VIEW COMPARISON:  None available currently. FINDINGS: The heart size and mediastinal contours are within normal limits. Right internal jugular Port-A-Cath is noted. Moderate left pleural effusion is noted. No pneumothorax is noted. Minimal right pleural effusion is noted. Left suprahilar opacity is noted concerning for pneumonia or atelectasis. The visualized skeletal structures are unremarkable. IMPRESSION: Moderate left pleural effusion is noted. Minimal right  pleural effusion is noted. Left suprahilar opacity is noted concerning for pneumonia or atelectasis. Electronically Signed   By: Marijo Conception, M.D.   On: 05/22/2018 16:13   Ct Chest W Contrast  Result Date: 05/27/2018 CLINICAL DATA:  Recurrent stage IV metastatic melanoma originally diagnosed 2011. Ongoing medical therapy. Restaging. EXAM: CT CHEST, ABDOMEN, AND PELVIS WITH CONTRAST TECHNIQUE: Multidetector CT imaging of the chest, abdomen and pelvis was performed following the standard protocol during bolus administration of intravenous contrast. CONTRAST:  164m OMNIPAQUE IOHEXOL 300 MG/ML  SOLN COMPARISON:  04/03/2018 CT chest, abdomen and pelvis. FINDINGS: CT CHEST FINDINGS Cardiovascular: Normal heart size. No significant pericardial effusion/thickening. Right internal jugular Port-A-Cath terminates in the lower third of the SVC. Atherosclerotic nonaneurysmal thoracic aorta. Normal caliber pulmonary arteries. No central pulmonary emboli. Mediastinum/Nodes: Stable subcentimeter hypodense right thyroid lobe nodule. Apparent left hemithyroidectomy. Unremarkable esophagus. No pathologically enlarged axillary, mediastinal or hilar lymph nodes. Lungs/Pleura: No pneumothorax. Small dependent right pleural effusion is new. Very large new left pleural effusion with associated complete left lower lobe and near complete left upper lobe atelectasis. There is extensive new irregular nodular plaque-like metastatic disease throughout the left pleural space measuring up to 5.6 x 2.9 cm in the medial upper left pleural space (series 2/image 16). Numerous (greater than 20) solid pulmonary nodules scattered throughout right lung, not appreciably changed. Representative stable 1.1 cm right middle lobe nodule (series 3/image 100) and stable 9 mm basilar right lower lobe nodule (series 3/image 113). Musculoskeletal: Stable sclerotic T7 and T9 vertebral lesions. No new focal osseous lesions. CT ABDOMEN PELVIS FINDINGS  Hepatobiliary: Normal liver size. Previously described vague hypoenhancing focus in the peripheral right liver lobe appears stable, measuring 1.7 x 1.4 cm (series 2/image 57), previously 1.6 x 1.4 cm using similar measurement technique. Poorly marginated hypoenhancing 2.5 x 2.0 cm anterior liver dome focus (series 2/image 50), increased from 1.1 x 0 9 cm. Numerous small low-attenuation circumscribed lesions scattered throughout the liver unchanged, largest 2.0 cm in the left liver lobe (series 2/image 62). No appreciable new liver lesions. Prominent diffuse gallbladder wall thickening is new. No radiopaque cholelithiasis. No pericholecystic fluid. No  biliary ductal dilatation. Pancreas: Normal, with no mass or duct dilation. Spleen: Normal size. No mass. Adrenals/Urinary Tract: Normal adrenals. No hydronephrosis. Scattered tiny subcentimeter hypodense renal cortical lesions are too small to characterize. Normal bladder. Stomach/Bowel: Normal non-distended stomach. Normal caliber small bowel with no small bowel wall thickening. Normal appendix. Normal large bowel with no diverticulosis, large bowel wall thickening or pericolonic fat stranding. Vascular/Lymphatic: Atherosclerotic nonaneurysmal abdominal aorta. Patent portal, splenic, hepatic and renal veins. No pathologically enlarged lymph nodes in the abdomen or pelvis. Reproductive: Simple 2.1 cm left adnexal cyst is stable. No right adnexal mass. Stable mildly enlarged uterus with scattered coarse internal calcifications suggesting partially calcified uterine fibroids. Other: No pneumoperitoneum, ascites or focal fluid collection. Right lower quadrant 6 mm soft tissue nodule within the peritoneal fat adjacent to right pelvic small bowel loops (series 2/image 105), not appreciably changed. Musculoskeletal: Stable large sclerotic L1 vertebral lesion with chronic severe L1 vertebral pathologic fracture. No new focal osseous lesions. Moderate lower lumbar spondylosis.  IMPRESSION: 1. Significant disease progression, predominantly due to new extensive left pleural metastatic disease with very large malignant left pleural effusion with associated near complete left lung atelectasis and with contralateral mediastinal shift. 2. Visualized right pulmonary metastases are stable. 3. Indistinct hypoenhancing liver dome lesion is increased, potentially indicating an enlarging liver metastasis. 4. Stable subcentimeter right lower quadrant peritoneal soft tissue implant. 5. Stable sclerotic spinal metastases. 6. New small dependent right pleural effusion. These results will be called to the ordering clinician or representative by the Radiologist Assistant, and communication documented in the PACS or zVision Dashboard. Electronically Signed   By: Ilona Sorrel M.D.   On: 05/27/2018 13:48   Ct Abdomen Pelvis W Contrast  Result Date: 05/27/2018 CLINICAL DATA:  Recurrent stage IV metastatic melanoma originally diagnosed 2011. Ongoing medical therapy. Restaging. EXAM: CT CHEST, ABDOMEN, AND PELVIS WITH CONTRAST TECHNIQUE: Multidetector CT imaging of the chest, abdomen and pelvis was performed following the standard protocol during bolus administration of intravenous contrast. CONTRAST:  155m OMNIPAQUE IOHEXOL 300 MG/ML  SOLN COMPARISON:  04/03/2018 CT chest, abdomen and pelvis. FINDINGS: CT CHEST FINDINGS Cardiovascular: Normal heart size. No significant pericardial effusion/thickening. Right internal jugular Port-A-Cath terminates in the lower third of the SVC. Atherosclerotic nonaneurysmal thoracic aorta. Normal caliber pulmonary arteries. No central pulmonary emboli. Mediastinum/Nodes: Stable subcentimeter hypodense right thyroid lobe nodule. Apparent left hemithyroidectomy. Unremarkable esophagus. No pathologically enlarged axillary, mediastinal or hilar lymph nodes. Lungs/Pleura: No pneumothorax. Small dependent right pleural effusion is new. Very large new left pleural effusion with  associated complete left lower lobe and near complete left upper lobe atelectasis. There is extensive new irregular nodular plaque-like metastatic disease throughout the left pleural space measuring up to 5.6 x 2.9 cm in the medial upper left pleural space (series 2/image 16). Numerous (greater than 20) solid pulmonary nodules scattered throughout right lung, not appreciably changed. Representative stable 1.1 cm right middle lobe nodule (series 3/image 100) and stable 9 mm basilar right lower lobe nodule (series 3/image 113). Musculoskeletal: Stable sclerotic T7 and T9 vertebral lesions. No new focal osseous lesions. CT ABDOMEN PELVIS FINDINGS Hepatobiliary: Normal liver size. Previously described vague hypoenhancing focus in the peripheral right liver lobe appears stable, measuring 1.7 x 1.4 cm (series 2/image 57), previously 1.6 x 1.4 cm using similar measurement technique. Poorly marginated hypoenhancing 2.5 x 2.0 cm anterior liver dome focus (series 2/image 50), increased from 1.1 x 0 9 cm. Numerous small low-attenuation circumscribed lesions scattered throughout the liver unchanged, largest 2.0 cm  in the left liver lobe (series 2/image 62). No appreciable new liver lesions. Prominent diffuse gallbladder wall thickening is new. No radiopaque cholelithiasis. No pericholecystic fluid. No biliary ductal dilatation. Pancreas: Normal, with no mass or duct dilation. Spleen: Normal size. No mass. Adrenals/Urinary Tract: Normal adrenals. No hydronephrosis. Scattered tiny subcentimeter hypodense renal cortical lesions are too small to characterize. Normal bladder. Stomach/Bowel: Normal non-distended stomach. Normal caliber small bowel with no small bowel wall thickening. Normal appendix. Normal large bowel with no diverticulosis, large bowel wall thickening or pericolonic fat stranding. Vascular/Lymphatic: Atherosclerotic nonaneurysmal abdominal aorta. Patent portal, splenic, hepatic and renal veins. No pathologically  enlarged lymph nodes in the abdomen or pelvis. Reproductive: Simple 2.1 cm left adnexal cyst is stable. No right adnexal mass. Stable mildly enlarged uterus with scattered coarse internal calcifications suggesting partially calcified uterine fibroids. Other: No pneumoperitoneum, ascites or focal fluid collection. Right lower quadrant 6 mm soft tissue nodule within the peritoneal fat adjacent to right pelvic small bowel loops (series 2/image 105), not appreciably changed. Musculoskeletal: Stable large sclerotic L1 vertebral lesion with chronic severe L1 vertebral pathologic fracture. No new focal osseous lesions. Moderate lower lumbar spondylosis. IMPRESSION: 1. Significant disease progression, predominantly due to new extensive left pleural metastatic disease with very large malignant left pleural effusion with associated near complete left lung atelectasis and with contralateral mediastinal shift. 2. Visualized right pulmonary metastases are stable. 3. Indistinct hypoenhancing liver dome lesion is increased, potentially indicating an enlarging liver metastasis. 4. Stable subcentimeter right lower quadrant peritoneal soft tissue implant. 5. Stable sclerotic spinal metastases. 6. New small dependent right pleural effusion. These results will be called to the ordering clinician or representative by the Radiologist Assistant, and communication documented in the PACS or zVision Dashboard. Electronically Signed   By: Ilona Sorrel M.D.   On: 05/27/2018 13:48   Dg Chest Port 1 View  Result Date: 05/29/2018 CLINICAL DATA:  Postop chest tube EXAM: PORTABLE CHEST 1 VIEW COMPARISON:  05/28/2018, 05/22/2018 FINDINGS: Right-sided central venous port tip over the cavoatrial region. Moderate left-sided pleural effusion, slightly decreased. Interim placement of left lower chest drainage catheter with tip positioned over the left medial chest. Probable tiny left apical pneumothorax. Airspace disease at the lingula and left base.  Tiny right pleural effusion. Obscured cardiomediastinal silhouette. IMPRESSION: 1. Interim placement of left lower chest tube with tip positioned over the mid medial thorax. There is decreased left pleural effusion with moderate pleural effusion and associated pleural thickening presumably due to metastatic disease. There may be a trace left apical pneumothorax. 2. Small right pleural effusion. 3. Persistent consolidation at the lingula and left base. Electronically Signed   By: Donavan Foil M.D.   On: 05/29/2018 15:06   Dg Chest Port 1 View  Result Date: 05/28/2018 CLINICAL DATA:  Status post thoracentesis EXAM: PORTABLE CHEST 1 VIEW COMPARISON:  05/28/2018, CT 05/27/2018 FINDINGS: Right-sided central venous port tip over the cavoatrial junction. Small right-sided pleural effusion. Moderate to large left pleural effusion, decreased compared to prior radiograph. Pleural thickening, corresponding to metastatic disease. Consolidation at the lingula and left base. Obscured cardiomediastinal silhouette. IMPRESSION: 1. Interval decrease in size of moderate large left pleural effusion with probable loculation at the left mid lung. No discrete pleural line to suggest pneumothorax. 2. Small right pleural effusion. Dense airspace disease at the lingula and left base. Electronically Signed   By: Donavan Foil M.D.   On: 05/28/2018 16:13   US Thoracentesis Asp Pleural Space W/img Guide  Result Date: 05/28/2018  INDICATION: History of recurrent symptomatic presumably malignant left-sided pleural effusion. Please perform ultrasound-guided thoracentesis for therapeutic purposes. EXAM: US THORACENTESIS ASP PLEURAL SPACE W/IMG GUIDE COMPARISON:  Ultrasound-guided left-sided thoracentesis-05/22/2018; chest CT-05/27/2018; chest radiograph-earlier same day; 05/22/2018 MEDICATIONS: None. COMPLICATIONS: None immediate. TECHNIQUE: Informed written consent was obtained from the patient after a discussion of the risks, benefits and  alternatives to treatment. A timeout was performed prior to the initiation of the procedure. Initial ultrasound scanning demonstrates a large anechoic left-sided pleural effusion. The lower chest was prepped and draped in the usual sterile fashion. 1% lidocaine was used for local anesthesia. An ultrasound image was saved for documentation purposes. An 8 Fr Safe-T-Centesis catheter was introduced. The thoracentesis was performed. Note, fluid was left behind as Dr. Genevive Bi is planning on placing a tunneled PleurX catheter tomorrow. The catheter was removed and a dressing was applied. The patient tolerated the procedure well without immediate post procedural complication. The patient was escorted to have an upright chest radiograph. FINDINGS: A total of approximately 2 liters of serous, slightly blood tinged fluid was removed. IMPRESSION: Successful ultrasound-guided left sided thoracentesis yielding 2 liters of pleural fluid. Note, an incomplete thoracentesis was performed as Dr. Genevive Bi is on placing a tunneled PleurX catheter tomorrow. Electronically Signed   By: Sandi Mariscal M.D.   On: 05/28/2018 16:47   US Thoracentesis Asp Pleural Space W/img Guide  Result Date: 05/22/2018 INDICATION: Patient with history of metastatic melanoma, now with left pleural effusion. Request is made for diagnostic and therapeutic thoracentesis. EXAM: ULTRASOUND GUIDED DIAGNOSTIC AND THERAPEUTIC LEFT THORACENTESIS MEDICATIONS: 10 mL 1% lidocaine COMPLICATIONS: None immediate. PROCEDURE: An ultrasound guided thoracentesis was thoroughly discussed with the patient and questions answered. The benefits, risks, alternatives and complications were also discussed. The patient understands and wishes to proceed with the procedure. Written consent was obtained. Ultrasound was performed to localize and mark an adequate pocket of fluid in the left chest. The area was then prepped and draped in the normal sterile fashion. 1% Lidocaine was used for local  anesthesia. Under ultrasound guidance a 6 Fr Safe-T-Centesis catheter was introduced. Thoracentesis was performed. The catheter was removed and a dressing applied. FINDINGS: A total of approximately 1.7 liters of dark, amber fluid was removed. Samples were sent to the laboratory as requested by the clinical team. IMPRESSION: Successful ultrasound guided left diagnostic and therapeutic thoracentesis yielding 1.7 liters of pleural fluid. Read by: Brynda Greathouse PA-C Electronically Signed   By: Jerilynn Mages.  Shick M.D.   On: 05/22/2018 16:09    PERFORMANCE STATUS (ECOG) : 1 - Symptomatic but completely ambulatory  REVIEW OF SYSTEMS: As noted above. Otherwise, a complete review of systems is negative.  PHYSICAL EXAM: General: NAD, frail appearing, thin Cardiovascular: regular rate and rhythm Pulmonary: clear ant fields, chest tube noted but not visualized Abdomen: soft, nontender, + bowel sounds Extremities: no edema, no joint deformities Skin: no rashes Neurological: Weakness but otherwise nonfocal  IMPRESSION: I met with patient, husband, and daughter to discuss goals.  Introduced palliative care services and attempted establish therapeutic rapport.  Patient is back from Pleurx insertion earlier today.  She seems to be doing well and is currently asymptomatic.  Patient says that she spoke with Dr. Rogue Bussing earlier today and was told that she is likely nearing end-of-life.  Patient tearfully explains that she knows it to be true but that it is difficult to accept because she feels so good.  She says that she anticipates future decline and the possibility of symptoms but does  not know what to expect.    I tried to elicit her primary goals at this stage of her illness but she seemed to struggle to answer those questions.  Patient clearly wants to be at home with her family.  We talked about quality of life and comfort.  At daughter's request, I met with her privately.  I answered her questions regarding  patient's prognosis.  We discussed the option of hospice involvement to support patient at home.  We will plan follow-up visit in the clinic to further discuss goals and clarify plan.  Emotional support provided.  Chaplain offered but declined.  PLAN: Plan for outpatient follow up in the clinic   Time Total: 50 minutes  Visit consisted of counseling and education dealing with the complex and emotionally intense issues of symptom management and palliative care in the setting of serious and potentially life-threatening illness.Greater than 50%  of this time was spent counseling and coordinating care related to the above assessment and plan.  Signed by: Altha Harm, PhD, NP-C [phone] (Work Cell)

## 2018-05-29 NOTE — Transfer of Care (Signed)
Immediate Anesthesia Transfer of Care Note  Patient: Tanya Vega  Procedure(s) Performed: CHEST TUBE INSERTION, PLEURX CATHETER INSERTION (N/A )  Patient Location: PACU  Anesthesia Type:General  Level of Consciousness: awake, alert  and oriented  Airway & Oxygen Therapy: Patient Spontanous Breathing  Post-op Assessment: Report given to RN and Post -op Vital signs reviewed and stable  Post vital signs: Reviewed and stable  Last Vitals:  Vitals Value Taken Time  BP 95/78 05/29/2018  2:31 PM  Temp    Pulse 100 05/29/2018  2:31 PM  Resp 22 05/29/2018  2:31 PM  SpO2 95 % 05/29/2018  2:31 PM  Vitals shown include unvalidated device data.  Last Pain:  Vitals:   05/29/18 1210  TempSrc:   PainSc: 0-No pain         Complications: No apparent anesthesia complications

## 2018-05-29 NOTE — Consult Note (Signed)
[facility] CONSULT NOTE  Patient Care Team: Dion Body, MD as PCP - General (Family Medicine)  CHIEF COMPLAINTS/PURPOSE OF CONSULTATION:  Metastatic melanoma/pleural effusion  HISTORY OF PRESENTING ILLNESS:  Tanya Vega 58 y.o.  female female pleasant but unfortunate female patient with metastatic melanoma stage IV-most recently on second line treatment with b-raf inhibitors + Mek-inhibitors-is currently admitted to the hospital for symptomatic right-sided pleural effusion.  Patient noted to have progressive shortness of breath the last few weeks.  A chest x-ray showed a large sided pleural effusion.  Patient underwent thoracentesis about 1 week ago-cytology positive for melanoma.  Patient had CT scan of the chest and pelvis that showed progressive liver lesions/worsening right-sided pleural effusion.   Patient has been admitted the hospital for management of the right pleural effusion for which she had repeat thoracentesis yesterday.  She is currently awaiting Pleurx catheter placement today.  Her breathing is slightly better.  Denies any headaches.  Review of Systems  Constitutional: Positive for malaise/fatigue and weight loss. Negative for chills, diaphoresis and fever.  HENT: Negative for nosebleeds and sore throat.   Eyes: Negative for double vision.  Respiratory: Positive for cough, sputum production and shortness of breath. Negative for hemoptysis and wheezing.   Cardiovascular: Negative for chest pain, palpitations, orthopnea and leg swelling.  Gastrointestinal: Negative for abdominal pain, blood in stool, constipation, diarrhea, heartburn, melena, nausea and vomiting.  Genitourinary: Negative for dysuria, frequency and urgency.  Musculoskeletal: Positive for back pain. Negative for joint pain.  Skin: Negative.  Negative for itching and rash.  Neurological: Negative for dizziness, tingling, focal weakness, weakness and headaches.  Endo/Heme/Allergies: Does  not bruise/bleed easily.  Psychiatric/Behavioral: Negative for depression. The patient is nervous/anxious. The patient does not have insomnia.      MEDICAL HISTORY:  Past Medical History:  Diagnosis Date  . Hypothyroidism   . Melanoma of skin ([place]) 2011   Resected from Right neck area with 4 lymph nodes as well.   . Thyroid cancer ([place]) 2001   Partial thyroidectomy    SURGICAL HISTORY: Past Surgical History:  Procedure Laterality Date  . BREAST BIOPSY Right 2005   neg  . BREAST BIOPSY Left 07/11/2016   radial scar  . BREAST EXCISIONAL BIOPSY Left 08/06/2016   lumpectomy for radial scar  . BREAST LUMPECTOMY WITH NEEDLE LOCALIZATION Left 08/06/2016   Procedure: BREAST LUMPECTOMY WITH NEEDLE LOCALIZATION;  Surgeon: Leonie Green, MD;  Location: [facility];  Service: General;  Laterality: Left;  Marland Kitchen MELANOMA EXCISION  2011   ear  . PORTA CATH INSERTION N/A 03/14/2017   Procedure: PORTA CATH INSERTION;  Surgeon: Katha Cabal, MD;  Location: [facility];  Service: Cardiovascular;  Laterality: N/A;  . THYROIDECTOMY, PARTIAL Left     SOCIAL HISTORY: Social History   Socioeconomic History  . Marital status: Married    Spouse name: Not on file  . Number of children: Not on file  . Years of education: Not on file  . Highest education level: Not on file  Occupational History  . Not on file  Social Needs  . Financial resource strain: Not on file  . Food insecurity:    Worry: Not on file    Inability: Not on file  . Transportation needs:    Medical: Not on file    Non-medical: Not on file  Tobacco Use  . Smoking status: Former Smoker    Packs/day: 0.25    Years: 20.00    Pack years:  5.00    Types: Cigarettes    Last attempt to quit: 03/26/1999    Years since quitting: 19.1  . Smokeless tobacco: Never Used  Substance and Sexual Activity  . Alcohol use: Yes    Comment: WEEKENDS  . Drug use: No  . Sexual activity: Yes  Lifestyle  . Physical activity:     Days per week: Not on file    Minutes per session: Not on file  . Stress: Not on file  Relationships  . Social connections:    Talks on phone: Not on file    Gets together: Not on file    Attends religious service: Not on file    Active member of club or organization: Not on file    Attends meetings of clubs or organizations: Not on file    Relationship status: Not on file  . Intimate partner violence:    Fear of current or ex partner: Not on file    Emotionally abused: Not on file    Physically abused: Not on file    Forced sexual activity: Not on file  Other Topics Concern  . Not on file  Social History Narrative  . Not on file    FAMILY HISTORY: Family History  Problem Relation Age of Onset  . Breast cancer Mother 35  . Breast cancer Paternal Aunt   . Colon cancer Brother 42    ALLERGIES:  is allergic to tetanus-diphtheria toxoids td.  MEDICATIONS:  Current Facility-Administered Medications  Medication Dose Route Frequency Provider Last Rate Last Dose  . acetaminophen (TYLENOL) suppository 650 mg  650 mg Rectal Q6H PRN Nestor Lewandowsky, MD      . dabrafenib mesylate (TAFINLAR) capsule 150 mg  150 mg Oral BID Nestor Lewandowsky, MD      . HYDROcodone-acetaminophen (NORCO/VICODIN) 5-325 MG per tablet 1 tablet  1 tablet Oral Q4H PRN Nestor Lewandowsky, MD   1 tablet at 05/29/18 1955  . levothyroxine (SYNTHROID, LEVOTHROID) tablet 75 mcg  75 mcg Oral QAC breakfast Nestor Lewandowsky, MD      . lidocaine-prilocaine (EMLA) cream 1 application  1 application Topical PRN Nestor Lewandowsky, MD      . ondansetron [facility]) tablet 4 mg  4 mg Oral Q6H PRN Nestor Lewandowsky, MD       Or  . ondansetron [facility]) injection 4 mg  4 mg Intravenous Q6H PRN Nestor Lewandowsky, MD      . polyethylene glycol (MIRALAX / GLYCOLAX) packet 17 g  17 g Oral Daily PRN Nestor Lewandowsky, MD      . sodium chloride tablet 2 g  2 g Oral TID WC Nestor Lewandowsky, MD   2 g at 05/28/18 1711  . trametinib dimethyl sulfoxide (MEKINIST) tablet 2  mg  2 mg Oral Daily Nestor Lewandowsky, MD          .  PHYSICAL EXAMINATION:  Vitals:   05/29/18 1542 05/29/18 1914  BP: 106/80 109/68  Pulse: 93 97  Resp: (!) 21 16  Temp: 97.9 F (36.6 C) 97.8 F (36.6 C)  SpO2: 96% 98%   Filed Weights   05/28/18 1139 05/28/18 1631 05/29/18 1210  Weight: 127 lb (57.6 kg) 137 lb 9.1 oz (62.4 kg) 137 lb 9.1 oz (62.4 kg)    Physical Exam  Constitutional: She is oriented to person, place, and time and well-developed, well-nourished, and in no distress.  Accompanied by her husband and daughter.  She is resting the bed comfortably on O2 nasal cannula.  HENT:  Head: Normocephalic and atraumatic.  Mouth/Throat: Oropharynx is clear and moist. No oropharyngeal exudate.  Eyes: Pupils are equal, round, and reactive to light.  Neck: Normal range of motion. Neck supple.  Cardiovascular: Normal rate and regular rhythm.  Pulmonary/Chest: No respiratory distress. She has no wheezes.  Decreased breath sounds on the right side compared to the left.  Abdominal: Soft. Bowel sounds are normal. She exhibits no distension and no mass. There is no abdominal tenderness. There is no rebound and no guarding.  Musculoskeletal: Normal range of motion.        General: No tenderness or edema.  Neurological: She is alert and oriented to person, place, and time.  Skin: Skin is warm.  Psychiatric: Affect normal.  Tearful     LABORATORY DATA:  I have reviewed the data as listed Lab Results  Component Value Date   WBC 9.0 05/28/2018   HGB 11.8 (L) 05/28/2018   HCT 35.8 (L) 05/28/2018   MCV 84.4 05/28/2018   PLT 498 (H) 05/28/2018   Recent Labs    04/07/18 1332 05/05/18 1353 05/28/18 1145 05/29/18 0401  NA 137 135 122* 132*  K 3.9 4.0 3.7 3.7  CL 102 102 89* 100  CO2 26 26 21* 24  GLUCOSE 102* 107* 136* 117*  BUN 14 14 11 11   CREATININE 0.67 0.68 0.62 0.61  CALCIUM 9.3 9.3 8.2* 7.5*  GFRNONAA >60 >60 >60 >60  GFRAA >60 >60 >60 >60  PROT 7.1 7.2 6.0*  --    ALBUMIN 3.4* 3.4* 2.4*  --   AST 23 26 39  --   ALT 15 15 22   --   ALKPHOS 101 117 147*  --   BILITOT 0.5 0.5 0.3  --     RADIOGRAPHIC STUDIES: I have personally reviewed the radiological images as listed and agreed with the findings in the report. Dg Chest 2 View  Result Date: 05/28/2018 CLINICAL DATA:  Shortness of breath started last night. EXAM: CHEST - 2 VIEW COMPARISON:  CT chest 05/27/2018 FINDINGS: Large left pleural effusion with only a small area of aerated lung at the apex. Left paramediastinal pleural mass better visualized on CT chest performed 05/27/2018. Small right pleural effusion. No pneumothorax. Stable cardiomediastinal silhouette. Right-sided Port-A-Cath in satisfactory position. No acute osseous abnormality. IMPRESSION: 1. Large left pleural effusion with only a small area of aerated lung at the apex. Small right pleural effusion. No interval change compared with 05/27/2018. Electronically Signed   By: Kathreen Devoid   On: 05/28/2018 12:35   Dg Chest 2 View  Result Date: 05/22/2018 CLINICAL DATA:  Status post left thoracentesis. EXAM: CHEST - 2 VIEW COMPARISON:  None available currently. FINDINGS: The heart size and mediastinal contours are within normal limits. Right internal jugular Port-A-Cath is noted. Moderate left pleural effusion is noted. No pneumothorax is noted. Minimal right pleural effusion is noted. Left suprahilar opacity is noted concerning for pneumonia or atelectasis. The visualized skeletal structures are unremarkable. IMPRESSION: Moderate left pleural effusion is noted. Minimal right pleural effusion is noted. Left suprahilar opacity is noted concerning for pneumonia or atelectasis. Electronically Signed   By: Marijo Conception, M.D.   On: 05/22/2018 16:13   Ct Chest W Contrast  Result Date: 05/27/2018 CLINICAL DATA:  Recurrent stage IV metastatic melanoma originally diagnosed 2011. Ongoing medical therapy. Restaging. EXAM: CT CHEST, ABDOMEN, AND PELVIS WITH  CONTRAST TECHNIQUE: Multidetector CT imaging of the chest, abdomen and pelvis was performed following the standard protocol during bolus administration  of intravenous contrast. CONTRAST:  181mL OMNIPAQUE IOHEXOL 300 MG/ML  SOLN COMPARISON:  04/03/2018 CT chest, abdomen and pelvis. FINDINGS: CT CHEST FINDINGS Cardiovascular: Normal heart size. No significant pericardial effusion/thickening. Right internal jugular Port-A-Cath terminates in the lower third of the SVC. Atherosclerotic nonaneurysmal thoracic aorta. Normal caliber pulmonary arteries. No central pulmonary emboli. Mediastinum/Nodes: Stable subcentimeter hypodense right thyroid lobe nodule. Apparent left hemithyroidectomy. Unremarkable esophagus. No pathologically enlarged axillary, mediastinal or hilar lymph nodes. Lungs/Pleura: No pneumothorax. Small dependent right pleural effusion is new. Very large new left pleural effusion with associated complete left lower lobe and near complete left upper lobe atelectasis. There is extensive new irregular nodular plaque-like metastatic disease throughout the left pleural space measuring up to 5.6 x 2.9 cm in the medial upper left pleural space (series 2/image 16). Numerous (greater than 20) solid pulmonary nodules scattered throughout right lung, not appreciably changed. Representative stable 1.1 cm right middle lobe nodule (series 3/image 100) and stable 9 mm basilar right lower lobe nodule (series 3/image 113). Musculoskeletal: Stable sclerotic T7 and T9 vertebral lesions. No new focal osseous lesions. CT ABDOMEN PELVIS FINDINGS Hepatobiliary: Normal liver size. Previously described vague hypoenhancing focus in the peripheral right liver lobe appears stable, measuring 1.7 x 1.4 cm (series 2/image 57), previously 1.6 x 1.4 cm using similar measurement technique. Poorly marginated hypoenhancing 2.5 x 2.0 cm anterior liver dome focus (series 2/image 50), increased from 1.1 x 0 9 cm. Numerous small low-attenuation  circumscribed lesions scattered throughout the liver unchanged, largest 2.0 cm in the left liver lobe (series 2/image 62). No appreciable new liver lesions. Prominent diffuse gallbladder wall thickening is new. No radiopaque cholelithiasis. No pericholecystic fluid. No biliary ductal dilatation. Pancreas: Normal, with no mass or duct dilation. Spleen: Normal size. No mass. Adrenals/Urinary Tract: Normal adrenals. No hydronephrosis. Scattered tiny subcentimeter hypodense renal cortical lesions are too small to characterize. Normal bladder. Stomach/Bowel: Normal non-distended stomach. Normal caliber small bowel with no small bowel wall thickening. Normal appendix. Normal large bowel with no diverticulosis, large bowel wall thickening or pericolonic fat stranding. Vascular/Lymphatic: Atherosclerotic nonaneurysmal abdominal aorta. Patent portal, splenic, hepatic and renal veins. No pathologically enlarged lymph nodes in the abdomen or pelvis. Reproductive: Simple 2.1 cm left adnexal cyst is stable. No right adnexal mass. Stable mildly enlarged uterus with scattered coarse internal calcifications suggesting partially calcified uterine fibroids. Other: No pneumoperitoneum, ascites or focal fluid collection. Right lower quadrant 6 mm soft tissue nodule within the peritoneal fat adjacent to right pelvic small bowel loops (series 2/image 105), not appreciably changed. Musculoskeletal: Stable large sclerotic L1 vertebral lesion with chronic severe L1 vertebral pathologic fracture. No new focal osseous lesions. Moderate lower lumbar spondylosis. IMPRESSION: 1. Significant disease progression, predominantly due to new extensive left pleural metastatic disease with very large malignant left pleural effusion with associated near complete left lung atelectasis and with contralateral mediastinal shift. 2. Visualized right pulmonary metastases are stable. 3. Indistinct hypoenhancing liver dome lesion is increased, potentially  indicating an enlarging liver metastasis. 4. Stable subcentimeter right lower quadrant peritoneal soft tissue implant. 5. Stable sclerotic spinal metastases. 6. New small dependent right pleural effusion. These results will be called to the ordering clinician or representative by the Radiologist Assistant, and communication documented in the PACS or zVision Dashboard. Electronically Signed   By: Ilona Sorrel M.D.   On: 05/27/2018 13:48   Ct Abdomen Pelvis W Contrast  Result Date: 05/27/2018 CLINICAL DATA:  Recurrent stage IV metastatic melanoma originally diagnosed 2011. Ongoing medical therapy. Restaging.  EXAM: CT CHEST, ABDOMEN, AND PELVIS WITH CONTRAST TECHNIQUE: Multidetector CT imaging of the chest, abdomen and pelvis was performed following the standard protocol during bolus administration of intravenous contrast. CONTRAST:  177mL OMNIPAQUE IOHEXOL 300 MG/ML  SOLN COMPARISON:  04/03/2018 CT chest, abdomen and pelvis. FINDINGS: CT CHEST FINDINGS Cardiovascular: Normal heart size. No significant pericardial effusion/thickening. Right internal jugular Port-A-Cath terminates in the lower third of the SVC. Atherosclerotic nonaneurysmal thoracic aorta. Normal caliber pulmonary arteries. No central pulmonary emboli. Mediastinum/Nodes: Stable subcentimeter hypodense right thyroid lobe nodule. Apparent left hemithyroidectomy. Unremarkable esophagus. No pathologically enlarged axillary, mediastinal or hilar lymph nodes. Lungs/Pleura: No pneumothorax. Small dependent right pleural effusion is new. Very large new left pleural effusion with associated complete left lower lobe and near complete left upper lobe atelectasis. There is extensive new irregular nodular plaque-like metastatic disease throughout the left pleural space measuring up to 5.6 x 2.9 cm in the medial upper left pleural space (series 2/image 16). Numerous (greater than 20) solid pulmonary nodules scattered throughout right lung, not appreciably changed.  Representative stable 1.1 cm right middle lobe nodule (series 3/image 100) and stable 9 mm basilar right lower lobe nodule (series 3/image 113). Musculoskeletal: Stable sclerotic T7 and T9 vertebral lesions. No new focal osseous lesions. CT ABDOMEN PELVIS FINDINGS Hepatobiliary: Normal liver size. Previously described vague hypoenhancing focus in the peripheral right liver lobe appears stable, measuring 1.7 x 1.4 cm (series 2/image 57), previously 1.6 x 1.4 cm using similar measurement technique. Poorly marginated hypoenhancing 2.5 x 2.0 cm anterior liver dome focus (series 2/image 50), increased from 1.1 x 0 9 cm. Numerous small low-attenuation circumscribed lesions scattered throughout the liver unchanged, largest 2.0 cm in the left liver lobe (series 2/image 62). No appreciable new liver lesions. Prominent diffuse gallbladder wall thickening is new. No radiopaque cholelithiasis. No pericholecystic fluid. No biliary ductal dilatation. Pancreas: Normal, with no mass or duct dilation. Spleen: Normal size. No mass. Adrenals/Urinary Tract: Normal adrenals. No hydronephrosis. Scattered tiny subcentimeter hypodense renal cortical lesions are too small to characterize. Normal bladder. Stomach/Bowel: Normal non-distended stomach. Normal caliber small bowel with no small bowel wall thickening. Normal appendix. Normal large bowel with no diverticulosis, large bowel wall thickening or pericolonic fat stranding. Vascular/Lymphatic: Atherosclerotic nonaneurysmal abdominal aorta. Patent portal, splenic, hepatic and renal veins. No pathologically enlarged lymph nodes in the abdomen or pelvis. Reproductive: Simple 2.1 cm left adnexal cyst is stable. No right adnexal mass. Stable mildly enlarged uterus with scattered coarse internal calcifications suggesting partially calcified uterine fibroids. Other: No pneumoperitoneum, ascites or focal fluid collection. Right lower quadrant 6 mm soft tissue nodule within the peritoneal fat  adjacent to right pelvic small bowel loops (series 2/image 105), not appreciably changed. Musculoskeletal: Stable large sclerotic L1 vertebral lesion with chronic severe L1 vertebral pathologic fracture. No new focal osseous lesions. Moderate lower lumbar spondylosis. IMPRESSION: 1. Significant disease progression, predominantly due to new extensive left pleural metastatic disease with very large malignant left pleural effusion with associated near complete left lung atelectasis and with contralateral mediastinal shift. 2. Visualized right pulmonary metastases are stable. 3. Indistinct hypoenhancing liver dome lesion is increased, potentially indicating an enlarging liver metastasis. 4. Stable subcentimeter right lower quadrant peritoneal soft tissue implant. 5. Stable sclerotic spinal metastases. 6. New small dependent right pleural effusion. These results will be called to the ordering clinician or representative by the Radiologist Assistant, and communication documented in the PACS or zVision Dashboard. Electronically Signed   By: Ilona Sorrel M.D.   On: 05/27/2018  13:48   Dg Chest Port 1 View  Result Date: 05/29/2018 CLINICAL DATA:  Postop chest tube EXAM: PORTABLE CHEST 1 VIEW COMPARISON:  05/28/2018, 05/22/2018 FINDINGS: Right-sided central venous port tip over the cavoatrial region. Moderate left-sided pleural effusion, slightly decreased. Interim placement of left lower chest drainage catheter with tip positioned over the left medial chest. Probable tiny left apical pneumothorax. Airspace disease at the lingula and left base. Tiny right pleural effusion. Obscured cardiomediastinal silhouette. IMPRESSION: 1. Interim placement of left lower chest tube with tip positioned over the mid medial thorax. There is decreased left pleural effusion with moderate pleural effusion and associated pleural thickening presumably due to metastatic disease. There may be a trace left apical pneumothorax. 2. Small right  pleural effusion. 3. Persistent consolidation at the lingula and left base. Electronically Signed   By: Donavan Foil M.D.   On: 05/29/2018 15:06   Dg Chest Port 1 View  Result Date: 05/28/2018 CLINICAL DATA:  Status post thoracentesis EXAM: PORTABLE CHEST 1 VIEW COMPARISON:  05/28/2018, CT 05/27/2018 FINDINGS: Right-sided central venous port tip over the cavoatrial junction. Small right-sided pleural effusion. Moderate to large left pleural effusion, decreased compared to prior radiograph. Pleural thickening, corresponding to metastatic disease. Consolidation at the lingula and left base. Obscured cardiomediastinal silhouette. IMPRESSION: 1. Interval decrease in size of moderate large left pleural effusion with probable loculation at the left mid lung. No discrete pleural line to suggest pneumothorax. 2. Small right pleural effusion. Dense airspace disease at the lingula and left base. Electronically Signed   By: Donavan Foil M.D.   On: 05/28/2018 16:13   US Thoracentesis Asp Pleural Space W/img Guide  Result Date: 05/28/2018 INDICATION: History of recurrent symptomatic presumably malignant left-sided pleural effusion. Please perform ultrasound-guided thoracentesis for therapeutic purposes. EXAM: US THORACENTESIS ASP PLEURAL SPACE W/IMG GUIDE COMPARISON:  Ultrasound-guided left-sided thoracentesis-05/22/2018; chest CT-05/27/2018; chest radiograph-earlier same day; 05/22/2018 MEDICATIONS: None. COMPLICATIONS: None immediate. TECHNIQUE: Informed written consent was obtained from the patient after a discussion of the risks, benefits and alternatives to treatment. A timeout was performed prior to the initiation of the procedure. Initial ultrasound scanning demonstrates a large anechoic left-sided pleural effusion. The lower chest was prepped and draped in the usual sterile fashion. 1% lidocaine was used for local anesthesia. An ultrasound image was saved for documentation purposes. An 8 Fr Safe-T-Centesis  catheter was introduced. The thoracentesis was performed. Note, fluid was left behind as Dr. Genevive Bi is planning on placing a tunneled PleurX catheter tomorrow. The catheter was removed and a dressing was applied. The patient tolerated the procedure well without immediate post procedural complication. The patient was escorted to have an upright chest radiograph. FINDINGS: A total of approximately 2 liters of serous, slightly blood tinged fluid was removed. IMPRESSION: Successful ultrasound-guided left sided thoracentesis yielding 2 liters of pleural fluid. Note, an incomplete thoracentesis was performed as Dr. Genevive Bi is on placing a tunneled PleurX catheter tomorrow. Electronically Signed   By: Sandi Mariscal M.D.   On: 05/28/2018 16:47   US Thoracentesis Asp Pleural Space W/img Guide  Result Date: 05/22/2018 INDICATION: Patient with history of metastatic melanoma, now with left pleural effusion. Request is made for diagnostic and therapeutic thoracentesis. EXAM: ULTRASOUND GUIDED DIAGNOSTIC AND THERAPEUTIC LEFT THORACENTESIS MEDICATIONS: 10 mL 1% lidocaine COMPLICATIONS: None immediate. PROCEDURE: An ultrasound guided thoracentesis was thoroughly discussed with the patient and questions answered. The benefits, risks, alternatives and complications were also discussed. The patient understands and wishes to proceed with the procedure. Written consent was  obtained. Ultrasound was performed to localize and mark an adequate pocket of fluid in the left chest. The area was then prepped and draped in the normal sterile fashion. 1% Lidocaine was used for local anesthesia. Under ultrasound guidance a 6 Fr Safe-T-Centesis catheter was introduced. Thoracentesis was performed. The catheter was removed and a dressing applied. FINDINGS: A total of approximately 1.7 liters of dark, amber fluid was removed. Samples were sent to the laboratory as requested by the clinical team. IMPRESSION: Successful ultrasound guided left diagnostic  and therapeutic thoracentesis yielding 1.7 liters of pleural fluid. Read by: Brynda Greathouse PA-C Electronically Signed   By: Jerilynn Mages.  Shick M.D.   On: 05/22/2018 16:09    Metastatic melanoma to liver [facility]) # 58 year old female patient with recurrent metastatic melanoma on B-raf inhibitor + MEK inhibitor- Dabrafenib+ Tremetinib is currently admitted to hospital for worsening shortness of breath/significant right-sided pleural effusion.  #Metastatic melanoma currently on second therapy with Dabrafenib+ Tremetinib.  March 2020 CT scan shows progressive disease-lung/right-sided pleural effusion/pleural-based disease liver lesions.  Unfortunately, no good options available moving forward.  I have discussed with Dr.Colichio at [facility] does not have any promising trials for the patient.  Patient previously declined referral to other tertiary care centers.  Discussed that chemotherapy [Temodar/taxanes] have poor response; and might compromise her quality of life-with worsening fatigue hair loss/nausea etc.  For now we will continue current therapy.  #Right-sided pleural effusion-secondary to worsening right-sided pleural-based disease.  Symptomatic.  Status post pleural tap.  Patient awaiting Pleurx catheter placement.  Discussed with Dr. Faith Rogue.  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  #CODE STATUS DNR/DNI.  Also discussed palliative care evaluation with [employer].  Patient family interested.  Discussed with Josh.  # DISPOSITION: Patient potentially could be discharged if medically stable after Pleurx catheter placement.  Recommend follow-up next week/will readdress treatment options for melanoma albeit limited.   Thank you Dr.Mody for allowing me to participate in the care of your pleasant patient. Please do not hesitate to contact me with questions or concerns in the interim. Discussed with Dr.Mody.   # I reviewed the blood work- with the patient in detail; also reviewed the  imaging independently [as summarized above]; and with the patient in detail.    Dr. Grayland Ormond is on call over the weekend.  Addendum: pt had her Pleurx catheter placed today.    All questions were answered. The patient knows to call the clinic with any problems, questions or concerns.       Cammie Sickle, MD 05/29/2018 10:16 PM

## 2018-05-29 NOTE — Assessment & Plan Note (Addendum)
#   58 year old female patient with recurrent metastatic melanoma on B-raf inhibitor + MEK inhibitor- Dabrafenib+ Tremetinib is currently admitted to hospital for worsening shortness of breath/significant right-sided pleural effusion.  #Metastatic melanoma currently on second therapy with Dabrafenib+ Tremetinib.  March 2020 CT scan shows progressive disease-lung/right-sided pleural effusion/pleural-based disease liver lesions.  Unfortunately, no good options available moving forward.  I have discussed with Dr.Colichio at [facility] does not have any promising trials for the patient.  Patient previously declined referral to other tertiary care centers.  Discussed that chemotherapy [Temodar/taxanes] have poor response; and might compromise her quality of life-with worsening fatigue hair loss/nausea etc.  For now we will continue current therapy.  #Right-sided pleural effusion-secondary to worsening right-sided pleural-based disease.  Symptomatic.  Status post pleural tap.  Patient awaiting Pleurx catheter placement.  Discussed with Dr. Faith Rogue.  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  #CODE STATUS DNR/DNI.  Also discussed palliative care evaluation with [employer].  Patient family interested.  Discussed with Josh.  # DISPOSITION: Patient potentially could be discharged if medically stable after Pleurx catheter placement.  Recommend follow-up next week/will readdress treatment options for melanoma albeit limited.   Thank you Dr.Mody for allowing me to participate in the care of your pleasant patient. Please do not hesitate to contact me with questions or concerns in the interim. Discussed with Dr.Mody.   # I reviewed the blood work- with the patient in detail; also reviewed the imaging independently [as summarized above]; and with the patient in detail.    Dr. Grayland Ormond is on call over the weekend.  Addendum: pt had her Pleurx catheter placed today.

## 2018-05-29 NOTE — Discharge Summary (Signed)
[facility] NAME: Tanya Vega    MR#:  [id number]  DATE OF BIRTH:  March 28, 1960  DATE OF ADMISSION:  05/28/2018 ADMITTING PHYSICIAN: Gorden Harms, MD  DATE OF DISCHARGE: 05/29/2018  PRIMARY CARE PHYSICIAN: Dion Body, MD    ADMISSION DIAGNOSIS:  Shortness of breath [R06.02] Respiratory failure ([employer]) [J96.90] Hyponatremia [E87.1] Pleural effusion [J90] Hypoxia [R09.02] Status post thoracentesis [Z98.890]  DISCHARGE DIAGNOSIS:  Active Problems:   Pleural effusion   SECONDARY DIAGNOSIS:   Past Medical History:  Diagnosis Date  . Hypothyroidism   . Melanoma of skin ([place]) 2011   Resected from Right neck area with 4 lymph nodes as well.   . Thyroid cancer ([place]) 2001   Partial thyroidectomy    HOSPITAL COURSE:  58 year old female with history of metastatic melanoma with malignant left recurrent pleural effusion who presented to the ER with shortness of breath.  1 acute hypoxic respiratory failure in the setting of left recurrent pleural effusion status post thoracentesis: Patient evaluated by cardiothoracic surgery.  Plan for pleural X.  She had approximately 2 L of fluid removed from thoracentesis. Patient weaned off of oxygen   2.  Metastatic melanoma with overall poor prognosis: Dr. Rogue Bussing spoke with the patient and family regarding her overall poor prognosis.  She will be discharged with outpatient palliative care services.  3.  Hypothyroid: Continue Synthroid 4.  Hyponatremia: This has improved and is related to her underlying pleural effusion DISCHARGE CONDITIONS AND DIET:   Guarded condition regular diet Outpatient palliative care services  CONSULTS OBTAINED:    DRUG ALLERGIES:   Allergies  Allergen Reactions  . Tetanus-Diphtheria Toxoids Td Other (See Comments)    Sores on skin    DISCHARGE MEDICATIONS:   Allergies as of 05/29/2018      Reactions   Tetanus-diphtheria Toxoids Td Other (See  Comments)   Sores on skin      Medication List    TAKE these medications   CALCIUM 500/VITAMIN D PO Take 2 tablets by mouth daily.   levothyroxine 75 MCG tablet Commonly known as:  SYNTHROID, LEVOTHROID TAKE 1 TABLET (75 MCG TOTAL) BY MOUTH DAILY BEFORE BREAKFAST.   lidocaine-prilocaine cream Commonly known as:  EMLA Apply 1 application topically as needed.   Mekinist 2 MG tablet Generic drug:  trametinib dimethyl sulfoxide TAKE 1 TABLET (2 MG TOTAL) BY MOUTH DAILY. TAKE 1 HOUR BEFORE OR 2 HOURS AFTER A MEAL. STORE REFRIGERATED IN ORIGINAL CONTAINER. What changed:  See the new instructions.   Tafinlar 75 MG capsule Generic drug:  dabrafenib mesylate TAKE 2 CAPSULES (150 MG TOTAL) BY MOUTH 2 (TWO) TIMES DAILY. TAKE ON AN EMPTY STOMACH 1 HOUR BEFORE OR 2 HOURS AFTER MEALS. What changed:  See the new instructions.         Today   CHIEF COMPLAINT:   Patient going for Pleurx today and then will be discharged home   VITAL SIGNS:  Blood pressure 97/69, pulse (!) 112, temperature 98.2 F (36.8 C), temperature source Oral, resp. rate 16, height 5\' 4"  (1.626 m), weight 62.4 kg, last menstrual period 02/29/2012, SpO2 91 %.   REVIEW OF SYSTEMS:  Review of Systems  Constitutional: Negative.  Negative for chills, fever and malaise/fatigue.  HENT: Negative.  Negative for ear discharge, ear pain, hearing loss, nosebleeds and sore throat.   Eyes: Negative.  Negative for blurred vision and pain.  Respiratory: Positive for shortness of breath. Negative for cough, hemoptysis and wheezing.  Cardiovascular: Negative.  Negative for chest pain, palpitations and leg swelling.  Gastrointestinal: Negative.  Negative for abdominal pain, blood in stool, diarrhea, nausea and vomiting.  Genitourinary: Negative.  Negative for dysuria.  Musculoskeletal: Negative.  Negative for back pain.  Skin: Negative.   Neurological: Negative for dizziness, tremors, speech change, focal weakness, seizures  and headaches.  Endo/Heme/Allergies: Negative.  Does not bruise/bleed easily.  Psychiatric/Behavioral: Negative.  Negative for depression, hallucinations and suicidal ideas.     PHYSICAL EXAMINATION:  GENERAL:  58 y.o.-year-old patient lying in the bed with no acute distress.  NECK:  Supple, no jugular venous distention. No thyroid enlargement, no tenderness.  LUNGS: Creased breath sounds left side CARDIOVASCULAR: S1, S2 normal. No murmurs, rubs, or gallops.  ABDOMEN: Soft, non-tender, non-distended. Bowel sounds present. No organomegaly or mass.  EXTREMITIES: No pedal edema, cyanosis, or clubbing.  PSYCHIATRIC: The patient is alert and oriented x 3.  SKIN: No obvious rash, lesion, or ulcer.   DATA REVIEW:   CBC Recent Labs  Lab 05/28/18 1145  WBC 9.0  HGB 11.8*  HCT 35.8*  PLT 498*    Chemistries  Recent Labs  Lab 05/28/18 1145 05/29/18 0401  NA 122* 132*  K 3.7 3.7  CL 89* 100  CO2 21* 24  GLUCOSE 136* 117*  BUN 11 11  CREATININE 0.62 0.61  CALCIUM 8.2* 7.5*  AST 39  --   ALT 22  --   ALKPHOS 147*  --   BILITOT 0.3  --     Cardiac Enzymes Recent Labs  Lab 05/28/18 1145  TROPONINI <0.03    Microbiology Results  @[email or web address] @  RADIOLOGY:  Dg Chest 2 View  Result Date: 05/28/2018 CLINICAL DATA:  Shortness of breath started last night. EXAM: CHEST - 2 VIEW COMPARISON:  CT chest 05/27/2018 FINDINGS: Large left pleural effusion with only a small area of aerated lung at the apex. Left paramediastinal pleural mass better visualized on CT chest performed 05/27/2018. Small right pleural effusion. No pneumothorax. Stable cardiomediastinal silhouette. Right-sided Port-A-Cath in satisfactory position. No acute osseous abnormality. IMPRESSION: 1. Large left pleural effusion with only a small area of aerated lung at the apex. Small right pleural effusion. No interval change compared with 05/27/2018. Electronically Signed   By: Kathreen Devoid   On: 05/28/2018 12:35   Dg  Chest Port 1 View  Result Date: 05/28/2018 CLINICAL DATA:  Status post thoracentesis EXAM: PORTABLE CHEST 1 VIEW COMPARISON:  05/28/2018, CT 05/27/2018 FINDINGS: Right-sided central venous port tip over the cavoatrial junction. Small right-sided pleural effusion. Moderate to large left pleural effusion, decreased compared to prior radiograph. Pleural thickening, corresponding to metastatic disease. Consolidation at the lingula and left base. Obscured cardiomediastinal silhouette. IMPRESSION: 1. Interval decrease in size of moderate large left pleural effusion with probable loculation at the left mid lung. No discrete pleural line to suggest pneumothorax. 2. Small right pleural effusion. Dense airspace disease at the lingula and left base. Electronically Signed   By: Donavan Foil M.D.   On: 05/28/2018 16:13   US Thoracentesis Asp Pleural Space W/img Guide  Result Date: 05/28/2018 INDICATION: History of recurrent symptomatic presumably malignant left-sided pleural effusion. Please perform ultrasound-guided thoracentesis for therapeutic purposes. EXAM: US THORACENTESIS ASP PLEURAL SPACE W/IMG GUIDE COMPARISON:  Ultrasound-guided left-sided thoracentesis-05/22/2018; chest CT-05/27/2018; chest radiograph-earlier same day; 05/22/2018 MEDICATIONS: None. COMPLICATIONS: None immediate. TECHNIQUE: Informed written consent was obtained from the patient after a discussion of the risks, benefits and alternatives to treatment. A timeout was performed prior to  the initiation of the procedure. Initial ultrasound scanning demonstrates a large anechoic left-sided pleural effusion. The lower chest was prepped and draped in the usual sterile fashion. 1% lidocaine was used for local anesthesia. An ultrasound image was saved for documentation purposes. An 8 Fr Safe-T-Centesis catheter was introduced. The thoracentesis was performed. Note, fluid was left behind as Dr. Genevive Bi is planning on placing a tunneled PleurX catheter tomorrow.  The catheter was removed and a dressing was applied. The patient tolerated the procedure well without immediate post procedural complication. The patient was escorted to have an upright chest radiograph. FINDINGS: A total of approximately 2 liters of serous, slightly blood tinged fluid was removed. IMPRESSION: Successful ultrasound-guided left sided thoracentesis yielding 2 liters of pleural fluid. Note, an incomplete thoracentesis was performed as Dr. Genevive Bi is on placing a tunneled PleurX catheter tomorrow. Electronically Signed   By: Sandi Mariscal M.D.   On: 05/28/2018 16:47      Allergies as of 05/29/2018      Reactions   Tetanus-diphtheria Toxoids Td Other (See Comments)   Sores on skin      Medication List    TAKE these medications   CALCIUM 500/VITAMIN D PO Take 2 tablets by mouth daily.   levothyroxine 75 MCG tablet Commonly known as:  SYNTHROID, LEVOTHROID TAKE 1 TABLET (75 MCG TOTAL) BY MOUTH DAILY BEFORE BREAKFAST.   lidocaine-prilocaine cream Commonly known as:  EMLA Apply 1 application topically as needed.   Mekinist 2 MG tablet Generic drug:  trametinib dimethyl sulfoxide TAKE 1 TABLET (2 MG TOTAL) BY MOUTH DAILY. TAKE 1 HOUR BEFORE OR 2 HOURS AFTER A MEAL. STORE REFRIGERATED IN ORIGINAL CONTAINER. What changed:  See the new instructions.   Tafinlar 75 MG capsule Generic drug:  dabrafenib mesylate TAKE 2 CAPSULES (150 MG TOTAL) BY MOUTH 2 (TWO) TIMES DAILY. TAKE ON AN EMPTY STOMACH 1 HOUR BEFORE OR 2 HOURS AFTER MEALS. What changed:  See the new instructions.          Management plans discussed with the patient and she is in agreement. Stable for discharge home  Patient should follow up with pcp  CODE STATUS:     Code Status Orders  (From admission, onward)         Start     Ordered   05/28/18 1641  Do not attempt resuscitation (DNR)  Continuous    Question Answer Comment  In the event of cardiac or respiratory ARREST Do not call a "code blue"   In the  event of cardiac or respiratory ARREST Do not perform Intubation, CPR, defibrillation or ACLS   In the event of cardiac or respiratory ARREST Use medication by any route, position, wound care, and other measures to relive pain and suffering. May use oxygen, suction and manual treatment of airway obstruction as needed for comfort.   Comments Nurse may pronounce      05/28/18 1640        Code Status History    This patient has a current code status but no historical code status.    Advance Directive Documentation     Most Recent Value  Type of Advance Directive  Healthcare Power of Attorney, Living will  Pre-existing out of facility DNR order (yellow form or pink MOST form)  -  "MOST" Form in Place?  -      TOTAL TIME TAKING CARE OF THIS PATIENT: 38 minutes.    Note: This dictation was prepared with Dragon dictation along with smaller  Company secretary. Any transcriptional errors that result from this process are unintentional.  Draken Farrior M.D on 05/29/2018 at 10:31 AM  Between 7am to 6pm - Pager - [phone] After 6pm go to [email or web address] - password [email or web address] [facility]  Office  [phone]  CC: Primary care physician; Dion Body, MD

## 2018-05-29 NOTE — Op Note (Signed)
  05/29/2018  2:37 PM  PATIENT:  Tanya Vega  58 y.o. female  PRE-OPERATIVE DIAGNOSIS: Malignant pleural effusion left side  POST-OPERATIVE DIAGNOSIS: Malignant pleural effusion left side  PROCEDURE: Insertion of left-sided Pleurx catheter  SURGEON:  Surgeon(s) and Role:    Nestor Lewandowsky, MD - Primary  ASSISTANTS: Loree Fee, PA  ANESTHESIA: Local with intravenous sedation  INDICATIONS FOR PROCEDURE malignant left-sided pleural effusion with symptoms  DICTATION: Patient was brought to the operating suite and placed in the right decubitus position.  She was secured to the table after intravenous sedation have been administered.  Tape was used to secure her hips and her shoulders.  An appropriate site was selected on the chest wall for insertion of her catheter.  She was then prepped and draped in usual sterile fashion.  Using 1% lidocaine as a local anesthetic skin wheals were raised over the entrance and exit sites.  Once the incisions were made the catheter was tunneled underneath the skin and using a combination of dilators the pleural space was accessed using the Seldinger technique.  The catheter was then peeled away as the Pleurx catheter was inserted.  Immediately there was a large amount of fluid coming from the pleural space.  This was clear and slightly blood-tinged.  The catheter was secured to the skin with a silk suture.  The skin was closed with a absorbable Vicryl stitch.  Sterile dressings were applied.  The patient was then taken to the recovery room in stable condition.   Nestor Lewandowsky, MD

## 2018-05-29 NOTE — Telephone Encounter (Signed)
reviewed chart- patient admitted. apts in c.ctr cnl

## 2018-05-29 NOTE — Anesthesia Post-op Follow-up Note (Signed)
Anesthesia QCDR form completed.        

## 2018-05-29 NOTE — Anesthesia Postprocedure Evaluation (Signed)
Anesthesia Post Note  Patient: Tanya Vega  Procedure(s) Performed: CHEST TUBE INSERTION, PLEURX CATHETER INSERTION (N/A )  Patient location during evaluation: PACU Anesthesia Type: General Level of consciousness: awake and alert and oriented Pain management: pain level controlled Vital Signs Assessment: post-procedure vital signs reviewed and stable Respiratory status: spontaneous breathing, nonlabored ventilation and respiratory function stable Cardiovascular status: blood pressure returned to baseline and stable Postop Assessment: no signs of nausea or vomiting Anesthetic complications: no     Last Vitals:  Vitals:   05/29/18 1514 05/29/18 1529  BP: 112/76 102/82  Pulse: 90 94  Resp: (!) 21 (!) 21  Temp:    SpO2: 97% 96%    Last Pain:  Vitals:   05/29/18 1529  TempSrc:   PainSc: 0-No pain                 [name]

## 2018-05-29 NOTE — Interval H&P Note (Signed)
History and Physical Interval Note:  05/29/2018 12:57 PM  Tanya Vega  has presented today for surgery, with the diagnosis of PLEURAL EFFUSION 390  The various methods of treatment have been discussed with the patient and family. After consideration of risks, benefits and other options for treatment, the patient has consented to  Procedure(s): CHEST TUBE INSERTION, PLEURX CATHETER INSERTION (N/A) as a surgical intervention .  The patient's history has been reviewed, patient examined, no change in status, stable for surgery.  I have reviewed the patient's chart and labs.  Questions were answered to the patient's satisfaction.     Nestor Lewandowsky

## 2018-05-30 LAB — HIV ANTIBODY (ROUTINE TESTING W REFLEX): HIV Screen 4th Generation wRfx: NONREACTIVE

## 2018-05-30 MED ORDER — ACETAMINOPHEN 325 MG PO TABS: 650.0000 mg | ORAL_TABLET | Freq: Four times a day (QID) | ORAL | Status: DC | PRN

## 2018-05-30 NOTE — Progress Notes (Signed)
MD order in [facility] for pt discharge on 05/30/2018; previous shift reviewed AVS with pt; Home Health for Pleurx catheter previously set up; Pleurx Catheter kits and education booklet along with faxed information for home Pleurx kits given to the pt; left lateral pleurx catheter site dressing changed, cap applied to the end; pt's discharge pending her getting dressed and ready to go

## 2018-05-30 NOTE — Progress Notes (Signed)
Pt discharged via wheelchair by nursing to the visitor's entrance 

## 2018-05-30 NOTE — Care Management Note (Addendum)
Case Management Note  Patient Details  Name: Tanya Vega MRN: [id number] Date of Birth: [birth year]  Subjective/Objective:   Patient to be discharged per MD order. Orders in place for home health services. CMS Medicare.gov Compare Post Acute Care list reviewed with patient and she has no preference of agency. Referral placed with [facility] from [place]. Patient to discharge with Pleurx cath in place. Completed forms for equipment and faxed to [place]. Patient given information to call [facility] at dc to schedule delivery. Three kits at the bedside for patient to take home. Education to be performed by bedside RN. Provided patient with educational packet which includes DVD demo.Per the patient Alvis Lemmings has already called to schedule appointment for Monday. No DME needs. Family to transport.                   Action/Plan:   Expected Discharge Date:  05/30/18               Expected Discharge Plan:  [place]  In-House Referral:     Discharge planning Services  CM Consult  Post Acute Care Choice:  Home Health Choice offered to:  Patient  DME Arranged:    DME Agency:     HH Arranged:  RN [place] Agency:  [facility]  Status of Service:  Completed, signed off  If discussed at [place] of Stay Meetings, dates discussed:    Additional Comments:  [name], RN 05/30/2018, 9:37 AM

## 2018-05-31 ENCOUNTER — Encounter: Payer: Self-pay | Admitting: Cardiothoracic Surgery

## 2018-06-02 ENCOUNTER — Inpatient Hospital Stay: Payer: BC Managed Care – PPO

## 2018-06-02 ENCOUNTER — Ambulatory Visit: Payer: BC Managed Care – PPO | Admitting: Internal Medicine

## 2018-06-02 ENCOUNTER — Other Ambulatory Visit: Payer: BC Managed Care – PPO

## 2018-06-05 ENCOUNTER — Inpatient Hospital Stay: Payer: BC Managed Care – PPO | Attending: Internal Medicine

## 2018-06-05 ENCOUNTER — Inpatient Hospital Stay (HOSPITAL_BASED_OUTPATIENT_CLINIC_OR_DEPARTMENT_OTHER): Payer: BC Managed Care – PPO | Admitting: Internal Medicine

## 2018-06-05 ENCOUNTER — Other Ambulatory Visit: Payer: Self-pay

## 2018-06-05 ENCOUNTER — Inpatient Hospital Stay: Payer: BC Managed Care – PPO

## 2018-06-05 ENCOUNTER — Encounter: Payer: Self-pay | Admitting: Internal Medicine

## 2018-06-05 VITALS — BP 107/78 | HR 131 | Temp 98.2°F | Wt 126.6 lb

## 2018-06-05 DIAGNOSIS — C7951 Secondary malignant neoplasm of bone: Secondary | ICD-10-CM

## 2018-06-05 DIAGNOSIS — Z85850 Personal history of malignant neoplasm of thyroid: Secondary | ICD-10-CM

## 2018-06-05 DIAGNOSIS — Z87891 Personal history of nicotine dependence: Secondary | ICD-10-CM | POA: Insufficient documentation

## 2018-06-05 DIAGNOSIS — C7931 Secondary malignant neoplasm of brain: Secondary | ICD-10-CM

## 2018-06-05 DIAGNOSIS — E871 Hypo-osmolality and hyponatremia: Secondary | ICD-10-CM | POA: Insufficient documentation

## 2018-06-05 DIAGNOSIS — C439 Malignant melanoma of skin, unspecified: Secondary | ICD-10-CM | POA: Insufficient documentation

## 2018-06-05 DIAGNOSIS — C787 Secondary malignant neoplasm of liver and intrahepatic bile duct: Secondary | ICD-10-CM

## 2018-06-05 DIAGNOSIS — E039 Hypothyroidism, unspecified: Secondary | ICD-10-CM | POA: Insufficient documentation

## 2018-06-05 LAB — COMPREHENSIVE METABOLIC PANEL
ALT: 24 U/L (ref 0–44)
AST: 49 U/L — ABNORMAL HIGH (ref 15–41)
Albumin: 1.6 g/dL — ABNORMAL LOW (ref 3.5–5.0)
Alkaline Phosphatase: 208 U/L — ABNORMAL HIGH (ref 38–126)
Anion gap: 10 (ref 5–15)
BUN: 15 mg/dL (ref 6–20)
CALCIUM: 7.4 mg/dL — AB (ref 8.9–10.3)
CO2: 22 mmol/L (ref 22–32)
CREATININE: 0.76 mg/dL (ref 0.44–1.00)
Chloride: 92 mmol/L — ABNORMAL LOW (ref 98–111)
GFR calc Af Amer: >60 mL/min (ref >60)
GFR calc non Af Amer: >60 mL/min (ref >60)
Glucose, Bld: 152 mg/dL — ABNORMAL HIGH (ref 70–99)
Potassium: 3.7 mmol/L (ref 3.5–5.1)
SODIUM: 124 mmol/L — AB (ref 135–145)
Total Bilirubin: 0.3 mg/dL (ref 0.3–1.2)
Total Protein: 5 g/dL — ABNORMAL LOW (ref 6.5–8.1)

## 2018-06-05 LAB — CBC WITH DIFFERENTIAL/PLATELET
Abs Immature Granulocytes: 0.1 10*3/uL — ABNORMAL HIGH (ref 0.00–0.07)
Basophils Absolute: 0 10*3/uL (ref 0.0–0.1)
Basophils Relative: 0 %
EOS PCT: 0 %
Eosinophils Absolute: 0 10*3/uL (ref 0.0–0.5)
HCT: 37.8 % (ref 36.0–46.0)
Hemoglobin: 12.6 g/dL (ref 12.0–15.0)
Immature Granulocytes: 1 %
Lymphocytes Relative: 13 %
Lymphs Abs: 1.4 10*3/uL (ref 0.7–4.0)
MCH: 27.8 pg (ref 26.0–34.0)
MCHC: 33.3 g/dL (ref 30.0–36.0)
MCV: 83.3 fL (ref 80.0–100.0)
Monocytes Absolute: 1.1 10*3/uL — ABNORMAL HIGH (ref 0.1–1.0)
Monocytes Relative: 10 %
Neutro Abs: 8 10*3/uL — ABNORMAL HIGH (ref 1.7–7.7)
Neutrophils Relative %: 76 %
Platelets: 472 10*3/uL — ABNORMAL HIGH (ref 150–400)
RBC: 4.54 MIL/uL (ref 3.87–5.11)
RDW: 14.1 % (ref 11.5–15.5)
WBC: 10.6 10*3/uL — ABNORMAL HIGH (ref 4.0–10.5)
nRBC: 0 % (ref 0.0–0.2)

## 2018-06-05 LAB — LACTATE DEHYDROGENASE: LDH: 742 U/L — ABNORMAL HIGH (ref 98–192)

## 2018-06-05 LAB — PROTIME-INR
INR: 0.9 (ref 0.8–1.2)
Prothrombin Time: 11.9 seconds (ref 11.4–15.2)

## 2018-06-05 LAB — APTT: aPTT: 29 seconds (ref 24–36)

## 2018-06-05 NOTE — Progress Notes (Signed)
Tanya Vega  Patient Care Team: Dion Body, MD as PCP - General (Family Medicine)  Cancer Staging No matching staging information was found for the patient.   Oncology History   # NOV 2018-RECURRENT METASTATIC MELANOMA MULTIPLE LIVER LESIONS/lung lesions [s/p Bx on Nov 30th]; DEc 2018- PET-multiple lung liver bone metastasis.   # Dec 11th,2018- ipi+Nivo s/p 2 cycles;  # Jan 23rd 2019 CT scan- Progression; Jan 2019- MRI brain- improved SBRT lesion; worsening 9 mm left parietal; new 2 mm right parietal; July 23rd MRI-Improved  #April 19, 2017- Dabrafenib+Mekinist; March 24th- significant PR.   # Dec 1st 2018- Brain mets-Right frontal 74m [s/p SBRT- dec 21st]; and 2 other 2-4 mm. March 2019- Improved; AUG 2019- Progression of Lung nodules.   # SBRT LUL nodule [[facility]; % fx- finished 02/13/2018 ]  # Bone mets- X-geva [12/04]; s/p RT [Dr.Crystal;dec 2017]  # March 2020- Hospice referral.   # Hx of Melanoma- dec 2011 [SLNBx- 0/4-Neg;[facility] ]; Hx of thyroid cancer- feb 2001- s/p left throid lobectomy [[place]]; NO RAIU.  # Molecular testing: Foundation: **BRAF V600E mutation; TMB-H; MSS; -------------------------------------------------------------------  DIAGNOSIS: MELANOMA- B-raf pos  STAGE:   IV   ;GOALS: palliative  CURRENT/MOST RECENT THERAPY- dab+mek/hospice.     Metastatic melanoma to liver ([facility]   INTERVAL HISTORY:  Tanya Rosetti89y.o.  female pleasant patient above history of  metastatic melanoma-brain and liver-currently on dabrafenib + Tremetinib [jan 26th 2019] is here for follow-up.  Patient was recently admitted to the hospital for worsening shortness of breath noted to have progressive pleural effusion for which Tanya Vega had thoracentesis x2.  Also had a Pleurx catheter.  Her appetite is fair.  Complains of fatigue.  Denies any pain.  No nausea no vomiting.  Review of Systems  Constitutional: Positive for malaise/fatigue  and weight loss. Negative for chills, diaphoresis and fever.  HENT: Negative for nosebleeds and sore throat.   Eyes: Negative for double vision.  Respiratory: Positive for cough and shortness of breath. Negative for hemoptysis, sputum production and wheezing.   Cardiovascular: Negative for chest pain, palpitations, orthopnea and leg swelling.  Gastrointestinal: Negative for abdominal pain, blood in stool, constipation, diarrhea, heartburn, melena, nausea and vomiting.  Genitourinary: Negative for dysuria, frequency and urgency.  Musculoskeletal: Negative for back pain and joint pain.  Skin: Negative.  Negative for itching and rash.  Neurological: Negative for dizziness, tingling, focal weakness, weakness and headaches.  Endo/Heme/Allergies: Does not bruise/bleed easily.  Psychiatric/Behavioral: Negative for depression. The patient is not nervous/anxious and does not have insomnia.      PAST MEDICAL HISTORY :  Past Medical History:  Diagnosis Date  . Hypothyroidism   . [place]an[place]   Resected from Right neck area with 4 lymph nodes as well.   . Thyroid cancer ([place] 2001   Partial thyroidectomy    PAST SURGICAL HISTORY :   Past Surgical History:  Procedure Laterality Date  . BREAST BIOPSY Right 2005   neg  . BREAST BIOPSY Left 07/11/2016   radial scar  . BREAST EXCISIONAL BIOPSY Left 08/06/2016   lumpectomy for radial scar  . BREAST LUMPECTOMY WITH NEEDLE LOCALIZATION Left 08/06/2016   Procedure: BREAST LUMPECTOMY WITH NEEDLE LOCALIZATION;  Surgeon: SLeonie Green MD;  Location: [facility] ORS;  Service: General;  Laterality: Left;  . CHEST TUBE INSERTION N/A 05/29/2018   Procedure: CHEST TUBE INSERTION, PLEURX CATHETER INSERTION;  Surgeon: ONestor Lewandowsky MD;  Location: [facility] ORS;  Service:  General;  Laterality: N/A;  . MELANOMA EXCISION  2011   ear  . PORTA CATH INSERTION N/A 03/14/2017   Procedure: PORTA CATH INSERTION;  Surgeon: Katha Cabal, MD;  Location:  [place] [facility];  Service: Cardiovascular;  Laterality: N/A;  . THYROIDECTOMY, PARTIAL Left     FAMILY HISTORY :   Family History  Problem Relation Age of Onset  . Breast cancer Mother 66  . Breast cancer Paternal Aunt   . Colon cancer Brother 33    SOCIAL HISTORY:   Social History   Tobacco Use  . Smoking status: Former Smoker    Packs/day: 0.25    Years: 20.00    Pack years: 5.00    Types: Cigarettes    Last attempt to quit: 03/26/1999    Years since quitting: 19.2  . Smokeless tobacco: Never Used  Substance Use Topics  . Alcohol use: Yes    Comment: WEEKENDS  . Drug use: No    ALLERGIES:  is allergic to tetanus-diphtheria toxoids td.  MEDICATIONS:  Current Outpatient Medications  Medication Sig Dispense Refill  . Calcium Carbonate-Vitamin D (CALCIUM 500/VITAMIN D PO) Take 2 tablets by mouth daily.     Marland Kitchen levothyroxine (SYNTHROID, LEVOTHROID) 75 MCG tablet TAKE 1 TABLET (75 MCG TOTAL) BY MOUTH DAILY BEFORE BREAKFAST. 90 tablet 2  . lidocaine-prilocaine (EMLA) cream Apply 1 application topically as needed. 30 g 4  . MEKINIST 2 MG tablet TAKE 1 TABLET (2 MG TOTAL) BY MOUTH DAILY. TAKE 1 HOUR BEFORE OR 2 HOURS AFTER A MEAL. STORE REFRIGERATED IN ORIGINAL CONTAINER. (Patient taking differently: Take 2 mg by mouth daily. ) 30 tablet 4  . TAFINLAR 75 MG capsule TAKE 2 CAPSULES (150 MG TOTAL) BY MOUTH 2 (TWO) TIMES DAILY. TAKE ON AN EMPTY STOMACH 1 HOUR BEFORE OR 2 HOURS AFTER MEALS. (Patient taking differently: Take 150 mg by mouth 2 (two) times daily. ) 120 capsule 4   No current facility-administered medications for this visit.     PHYSICAL EXAMINATION: ECOG PERFORMANCE STATUS: 0 - Asymptomatic  BP 107/78 (BP Location: Left Arm)   Pulse (!) 131   Temp 98.2 F (36.8 C) (Tympanic)   Wt 126 lb 9.6 oz (57.4 kg)   LMP 02/29/2012 (Approximate)   SpO2 99% Comment: room air  BMI 21.73 kg/m   Filed Weights   06/05/18 0836  Weight: 126 lb 9.6 oz (57.4 kg)   Physical  Exam  Constitutional: Tanya Vega is oriented to person, place, and time and well-developed, well-nourished, and in no distress.  Tanya Vega is accompanied by her husband/daughter..  Walking by herself.  HENT:  Head: Normocephalic and atraumatic.  Mouth/Throat: Oropharynx is clear and moist. No oropharyngeal exudate.  Eyes: Pupils are equal, round, and reactive to light.  Neck: Normal range of motion. Neck supple.  Cardiovascular: Normal rate and regular rhythm.  Pulmonary/Chest: No respiratory distress. Tanya Vega has no wheezes.  Decreased breath sounds bilaterally left more than right.  Pleurx catheter in place.  Abdominal: Soft. Bowel sounds are normal. Tanya Vega exhibits no distension and no mass. There is no abdominal tenderness. There is no rebound and no guarding.  Musculoskeletal: Normal range of motion.        General: No tenderness or edema.  Neurological: Tanya Vega is alert and oriented to person, place, and time.  Skin: Skin is warm.  Psychiatric: Affect normal.  Tearful.     LABORATORY DATA:  I have reviewed the data as listed    Component Value Date/Time  NA 124 (L) 06/05/2018 0821   K 3.7 06/05/2018 0821   CL 92 (L) 06/05/2018 0821   CO2 22 06/05/2018 0821   GLUCOSE 152 (H) 06/05/2018 0821   BUN 15 06/05/2018 0821   CREATININE 0.76 06/05/2018 0821   CALCIUM 7.4 (L) 06/05/2018 0821   PROT 5.0 (L) 06/05/2018 0821   ALBUMIN 1.6 (L) 06/05/2018 0821   AST 49 (H) 06/05/2018 0821   ALT 24 06/05/2018 0821   ALKPHOS 208 (H) 06/05/2018 0821   BILITOT 0.3 06/05/2018 0821   GFRNONAA >60 06/05/2018 0821   GFRAA >60 06/05/2018 0821    No results found for: SPEP, UPEP  Lab Results  Component Value Date   WBC 10.6 (H) 06/05/2018   NEUTROABS 8.0 (H) 06/05/2018   HGB 12.6 06/05/2018   HCT 37.8 06/05/2018   MCV 83.3 06/05/2018   PLT 472 (H) 06/05/2018      Chemistry      Component Value Date/Time   NA 124 (L) 06/05/2018 0821   K 3.7 06/05/2018 0821   CL 92 (L) 06/05/2018 0821   CO2 22  06/05/2018 0821   BUN 15 06/05/2018 0821   CREATININE 0.76 06/05/2018 0821      Component Value Date/Time   CALCIUM 7.4 (L) 06/05/2018 0821   ALKPHOS 208 (H) 06/05/2018 0821   AST 49 (H) 06/05/2018 0821   ALT 24 06/05/2018 0821   BILITOT 0.3 06/05/2018 0821       RADIOGRAPHIC STUDIES: I have personally reviewed the radiological images as listed and agreed with the findings in the report. No results found.   ASSESSMENT & PLAN:  Metastatic melanoma to liver ([place]) # Recurrent metastatic melanoma BRAF POSITIVE- multiple metastatic lesions to the liver; bilateral lung nodules; L1 vertebral compression fracture.  On DAB+MEK [jan 2019].   #Patient has significant progression of disease-liver left lung pleural effusion.  Patient unfortunately has no good systemic therapy options.  Discussed with [facility].  Discussed chemotherapy-but poor response/risk of side effects increasing fatigue hair loss etc.  Patient is not interested in chemotherapy.  # Brain metastases-right frontal status post SBRT; MRI November 2019 [[facility]]-lesion smaller clinically stable.  #Hyponatremia-sodium 107 multifactorial recommend limit free water restriction; recommend increase salt intake.  #Debility/difficulty going up and down the stairs/gait instability recommend lift chair.  Will give a prescription.  #Discussed regarding DNR/DNI/hospice referral.  Patient met with [facility] palliative care in the hospital.  Discussed follow-up/as needed.  # DISPOSITION: # hospice referral # follow up as needed- Dr.B     No orders of the defined types were placed in this encounter.  All questions were answered. The patient knows to call the clinic with any problems, questions or concerns.      Cammie Sickle, MD 06/06/2018 10:45 AM

## 2018-06-05 NOTE — Progress Notes (Signed)
Patient here for follow up. Pt complains of increased weakness.

## 2018-06-05 NOTE — Assessment & Plan Note (Addendum)
#  Recurrent metastatic melanoma BRAF POSITIVE- multiple metastatic lesions to the liver; bilateral lung nodules; L1 vertebral compression fracture.  On DAB+MEK [jan 2019].   #Patient has significant progression of disease-liver left lung pleural effusion.  Patient unfortunately has no good systemic therapy options.  Discussed with [facility].  Discussed chemotherapy-but poor response/risk of side effects increasing fatigue hair loss etc.  Patient is not interested in chemotherapy.  # Brain metastases-right frontal status post SBRT; MRI November 2019 [[facility]]-lesion smaller clinically stable.  #Hyponatremia-sodium 107 multifactorial recommend limit free water restriction; recommend increase salt intake.  #Debility/difficulty going up and down the stairs/gait instability recommend lift chair.  Will give a prescription.  #Discussed regarding DNR/DNI/hospice referral.  Patient met with [facility] in the hospital.  Discussed follow-up/as needed.  # DISPOSITION: # hospice referral # follow up as needed- Dr.B

## 2018-06-10 ENCOUNTER — Telehealth: Payer: Self-pay | Admitting: *Deleted

## 2018-06-10 NOTE — Telephone Encounter (Signed)
Pt is draining her Pleurax catheter BID, 1000 ml each time. Occasionally, she will drain it once a day. She would like tol change order to read may drain catheter as needed if that is [place] with Dr Genevive Bi?

## 2018-06-12 ENCOUNTER — Emergency Department
Admission: EM | Admit: 2018-06-12 | Discharge: 2018-06-13 | Disposition: A | Discharge status: Home / Self Care | Payer: BC Managed Care – PPO | Attending: Emergency Medicine | Admitting: Emergency Medicine

## 2018-06-12 ENCOUNTER — Encounter: Payer: Self-pay | Admitting: Emergency Medicine

## 2018-06-12 ENCOUNTER — Other Ambulatory Visit: Payer: Self-pay

## 2018-06-12 DIAGNOSIS — Z87891 Personal history of nicotine dependence: Secondary | ICD-10-CM | POA: Diagnosis not present

## 2018-06-12 DIAGNOSIS — C787 Secondary malignant neoplasm of liver and intrahepatic bile duct: Secondary | ICD-10-CM | POA: Insufficient documentation

## 2018-06-12 DIAGNOSIS — J90 Pleural effusion, not elsewhere classified: Secondary | ICD-10-CM

## 2018-06-12 DIAGNOSIS — E039 Hypothyroidism, unspecified: Secondary | ICD-10-CM | POA: Insufficient documentation

## 2018-06-12 DIAGNOSIS — Z79899 Other long term (current) drug therapy: Secondary | ICD-10-CM | POA: Insufficient documentation

## 2018-06-12 DIAGNOSIS — R0600 Dyspnea, unspecified: Secondary | ICD-10-CM | POA: Diagnosis present

## 2018-06-12 DIAGNOSIS — C73 Malignant neoplasm of thyroid gland: Secondary | ICD-10-CM | POA: Diagnosis not present

## 2018-06-12 DIAGNOSIS — J910 Malignant pleural effusion: Secondary | ICD-10-CM | POA: Insufficient documentation

## 2018-06-12 DIAGNOSIS — R000 Tachycardia, unspecified: Secondary | ICD-10-CM | POA: Diagnosis not present

## 2018-06-12 DIAGNOSIS — C439 Malignant melanoma of skin, unspecified: Secondary | ICD-10-CM

## 2018-06-12 DIAGNOSIS — C799 Secondary malignant neoplasm of unspecified site: Secondary | ICD-10-CM

## 2018-06-12 NOTE — ED Provider Notes (Signed)
[name] Emergency Department Provider Note  ____________________________________________   First MD Initiated Contact with Patient 06/12/18 2300     (approximate)  I have reviewed the triage vital signs and the nursing notes.   HISTORY  Chief Complaint No chief complaint on file.    HPI Tanya Vega is a 58 y.o. female who has end stage metastatic cancer with a chronic left-sided pleural effusion and had a Pleurx catheter placed in this hospital about 2 weeks ago.  She is here tonight because she is out of vacuum drainage bottles and is having some increased work of breathing.  She and her husband report that she is having to drain the fluid more often than originally planned so she ran out of supplies early.  Her husband contact the supply company but due to the COVID-19 pandemic, the request for increased supplies was misplaced (according to the company) and the bottles will not be available until tomorrow.  She is draining at least a liter a day and can feel when it needs to be drained and she says she has no other option but to come to the emergency department.  She knows that she is tachycardic and says she always has a rapid heart rate but she feels at her baseline.  She is no longer a candidate for treatment and confirms with her husband that she is in the comfort care stage of her life with no hope of recovery.  She denies recent fever/chills, sore throat, chest pain different from the usual discomfort she experiences, nausea, vomiting, and abdominal pain.  Her symptoms are severe and gradual in onset over the day but this is normal for her and she is here only for supplies because she had no other place to go.         Past Medical History:  Diagnosis Date  . Hypothyroidism   . Melanoma of skin ([place]) 2011   Resected from Right neck area with 4 lymph nodes as well.   . Thyroid cancer [facility]) 2001   Partial thyroidectomy    Patient Active Problem  List   Diagnosis Date Noted  . Palliative care encounter   . Pleural effusion 05/28/2018  . Encounter for monitoring cardiotoxic drug therapy 04/16/2017  . Dehydration 04/15/2017  . Goals of care, counseling/discussion 03/04/2017  . Metastatic melanoma to liver ([place]) 02/18/2017  . Cancer, metastatic to bone ([place]) 02/18/2017    Past Surgical History:  Procedure Laterality Date  . BREAST BIOPSY Right 2005   neg  . BREAST BIOPSY Left 07/11/2016   radial scar  . BREAST EXCISIONAL BIOPSY Left 08/06/2016   lumpectomy for radial scar  . BREAST LUMPECTOMY WITH NEEDLE LOCALIZATION Left 08/06/2016   Procedure: BREAST LUMPECTOMY WITH NEEDLE LOCALIZATION;  Surgeon: Leonie Green, MD;  Location: [facility] ORS;  Service: General;  Laterality: Left;  . CHEST TUBE INSERTION N/A 05/29/2018   Procedure: CHEST TUBE INSERTION, PLEURX CATHETER INSERTION;  Surgeon: Nestor Lewandowsky, MD;  Location: [facility] ORS;  Service: General;  Laterality: N/A;  . MELANOMA EXCISION  2011   ear  . PORTA CATH INSERTION N/A 03/14/2017   Procedure: PORTA CATH INSERTION;  Surgeon: Katha Cabal, MD;  Location: [place] [facility];  Service: Cardiovascular;  Laterality: N/A;  . THYROIDECTOMY, PARTIAL Left     Prior to Admission medications   Medication Sig Start Date End Date Taking? Authorizing Provider  Calcium Carbonate-Vitamin D (CALCIUM 500/VITAMIN D PO) Take 2 tablets by mouth daily.  [provider]  levothyroxine (SYNTHROID, LEVOTHROID) 75 MCG tablet TAKE 1 TABLET (75 MCG TOTAL) BY MOUTH DAILY BEFORE BREAKFAST. 01/21/18   Cammie Sickle, MD  lidocaine-prilocaine (EMLA) cream Apply 1 application topically as needed. 03/14/17   Cammie Sickle, MD  MEKINIST 2 MG tablet TAKE 1 TABLET (2 MG TOTAL) BY MOUTH DAILY. TAKE 1 HOUR BEFORE OR 2 HOURS AFTER A MEAL. STORE REFRIGERATED IN ORIGINAL CONTAINER. Patient taking differently: Take 2 mg by mouth daily.  02/17/18   Cammie Sickle, MD   TAFINLAR 75 MG capsule TAKE 2 CAPSULES (150 MG TOTAL) BY MOUTH 2 (TWO) TIMES DAILY. TAKE ON AN EMPTY STOMACH 1 HOUR BEFORE OR 2 HOURS AFTER MEALS. Patient taking differently: Take 150 mg by mouth 2 (two) times daily.  02/17/18   Cammie Sickle, MD    Allergies Tetanus-diphtheria toxoids td  Family History  Problem Relation Age of Onset  . Breast cancer Mother 79  . Breast cancer Paternal Aunt   . Colon cancer Brother 47    Social History Social History   Tobacco Use  . Smoking status: Former Smoker    Packs/day: 0.25    Years: 20.00    Pack years: 5.00    Types: Cigarettes    Last attempt to quit: 03/26/1999    Years since quitting: 19.2  . Smokeless tobacco: Never Used  Substance Use Topics  . Alcohol use: Yes    Comment: WEEKENDS  . Drug use: No    Review of Systems Constitutional: No fever/chills Eyes: No visual changes. ENT: No sore throat. Cardiovascular: Denies chest pain. Respiratory: Shortness of breath associated with reaccumulating pleural effusion. Gastrointestinal: No abdominal pain.  No vomiting.   Genitourinary: Negative for dysuria. Musculoskeletal: Negative for neck pain.  Negative for back pain. Integumentary: Negative for rash. Neurological: Negative for headaches, focal weakness or numbness.   ____________________________________________   PHYSICAL EXAM:  VITAL SIGNS: ED Triage Vitals  Enc Vitals Group     BP 06/12/18 2237 (!) 85/62     Pulse Rate 06/12/18 2237 (!) 136     Resp 06/12/18 2237 20     Temp --      Temp src --      SpO2 06/12/18 2237 98 %     Weight 06/12/18 2238 57.6 kg (127 lb)     Height 06/12/18 2238 1.626 m (5\' 4" )     Head Circumference --      Peak Flow --      Pain Score 06/12/18 2238 0     Pain Loc --      Pain Edu? --      Excl. in [place]? --     Constitutional: Alert and oriented.  Appears chronically ill but in no acute distress at this time. Eyes: Conjunctivae are normal.  Head: Atraumatic. Nose: No  congestion/rhinnorhea. Mouth/Throat: Mucous membranes are moist. Neck: No stridor.  No meningeal signs.   Cardiovascular: Tachycardia, regular rhythm. Good peripheral circulation. Grossly normal heart sounds.  Pleurx catheter is in place in the left chest. Respiratory: Normal respiratory effort.  No retractions.  Decreased breath sounds on the left. Gastrointestinal: Soft and nontender. No distention.  Musculoskeletal: No lower extremity tenderness nor edema. No gross deformities of extremities. Neurologic:  Normal speech and language. No gross focal neurologic deficits are appreciated.  Skin:  Skin is warm, dry and intact. No rash noted. Psychiatric: Mood and affect are normal. Speech and behavior are normal.  ____________________________________________   LABS (all  labs ordered are listed, but only abnormal results are displayed)  Labs Reviewed - No data to display ____________________________________________  EKG  None - EKG not ordered by ED physician ____________________________________________  RADIOLOGY   ED MD interpretation: No indication for imaging  Official radiology report(s): No results found.  ____________________________________________   PROCEDURES   Procedure(s) performed (including Critical Care):  Procedures   ____________________________________________   INITIAL IMPRESSION / MDM / ASSESSMENT AND PLAN / ED COURSE  As part of my medical decision making, I reviewed the following data within the [place] History obtained from family, Nursing notes reviewed and incorporated, Old chart reviewed and Notes from prior ED visits         Differential diagnosis includes, but is not limited to, reaccumulating pleural effusion, healthcare associated pneumonia, worsening tumor/metastasis burden, postobstructive pneumonia, viral infection including but not limited to COVID-19.  The patient is chronically ill and explicitly stated that she  is in the comfort care portion of her life.  I explained to her that I cannot verify that she has no acute infection or other treatable medical condition and she and her husband both understand but they do not wish to pursue additional evaluation or treatment, they simply need supplies to ease her work of breathing and make her more comfortable.  Although she is not retracting she says that she can feel when her fluid is reaccumulating and although she is calm and not using accessory muscles I can see that she is uncomfortable.  I have verified in the medical record everything that they said in terms of her history and there have been telephone communications regarding the supplies.  I have placed an order and talked to the ED nurses about obtaining 2 bottles for the patient, 1 to use tonight before she leaves for comfort another to take home for her to use before she is able to pick up the supplies at the medical supply company tomorrow.  The husband and patient are both comfortable with this plan.  She has the capacity to make her own decisions and I understand the reason for not wanting to pursue additional evaluation or treatment at this time.  She knows that she can return anytime for additional work-up.  Note: We were able to track down to Pleurx catheter drainage kits and she used 1 of them in the ED and feels much better after draining a full liter of fluid.  She is being sent home with a cycle and knows to pick up her equipment at the next available opportunity.     ____________________________________________  FINAL CLINICAL IMPRESSION(S) / ED DIAGNOSES  Final diagnoses:  Pleural effusion on left  Metastatic melanoma ([place])  Sinus tachycardia     MEDICATIONS GIVEN DURING THIS VISIT:  Medications - No data to display   ED Discharge Orders    None       Note:  This document was prepared using Dragon voice recognition software and may include unintentional dictation errors.    Hinda Kehr, MD 06/13/18 [phone]

## 2018-06-12 NOTE — Discharge Instructions (Signed)
As per our discussion, we provided drainage bottles for you to drain your pleural effusion at home until you can pick up your supplies.  Please follow-up with your oncologist and cardiothoracic surgeon as needed or as planned.  Also as we discussed, you declined any additional testing or treatment today which we understand but remember that you can return to the emergency department at any time for additional evaluation if you are concerned about any new or worsening symptoms.

## 2018-06-12 NOTE — ED Triage Notes (Signed)
Pt reports she has fluid build up in chest cavity reports she ran out of supplies reports drain tube placed 2 weeks ago. Pt reports she has Lung Cancer, reports no longer doing any treatments. Pt reports she just needs fluid to be drain from chest tube. Pt talks in complete sentences no respiratory distress noted

## 2018-06-15 ENCOUNTER — Ambulatory Visit (INDEPENDENT_AMBULATORY_CARE_PROVIDER_SITE_OTHER): Payer: BC Managed Care – PPO | Admitting: Cardiothoracic Surgery

## 2018-06-15 ENCOUNTER — Other Ambulatory Visit: Payer: Self-pay | Admitting: Hospice and Palliative Medicine

## 2018-06-15 ENCOUNTER — Other Ambulatory Visit: Payer: Self-pay

## 2018-06-15 ENCOUNTER — Telehealth: Payer: Self-pay | Admitting: *Deleted

## 2018-06-15 VITALS — BP 110/68 | Temp 97.7°F | Ht 64.0 in

## 2018-06-15 DIAGNOSIS — C787 Secondary malignant neoplasm of liver and intrahepatic bile duct: Secondary | ICD-10-CM

## 2018-06-15 DIAGNOSIS — J90 Pleural effusion, not elsewhere classified: Secondary | ICD-10-CM | POA: Diagnosis not present

## 2018-06-15 DIAGNOSIS — R531 Weakness: Secondary | ICD-10-CM

## 2018-06-15 MED ORDER — ONDANSETRON HCL 8 MG PO TABS: 8.0000 mg | ORAL_TABLET | Freq: Three times a day (TID) | ORAL | 0 refills | Status: AC | PRN

## 2018-06-15 NOTE — Telephone Encounter (Signed)
Patient informed prescription sent 

## 2018-06-15 NOTE — Telephone Encounter (Signed)
Patient called asking for medicine to be called in for nausea stating she has been unable to eat due to nausea. Please advise

## 2018-06-15 NOTE — Progress Notes (Signed)
Spoke with patient's husband by phone. Patient has been having intermittent nausea without vomiting. No abd pain, fevers, chills, reported. Having bowel movements without difficulty. Family is requesting medication for nausea. Will send rx for Zofran.   Husband is also requesting rx for a chair lift to facilitate transport to second floor. Patient is having progressive weakness. Will leave rx at registration desk for him to pick up.   I discussed option of hospice. They current have home health nurse coming to the house but would be interested in hospice once home health is finished. Husband to call and let us know when to send the hospice order.   Plan: -Zofran 8mg  TID prn for nausea -Chair lift due to weakness (R53.1) -Hospice once home health is complete

## 2018-06-15 NOTE — Progress Notes (Signed)
  Patient ID: Tanya Vega, female   DOB: 16-Apr-1960, 58 y.o.   MRN: [id number]  HISTORY: She comes in today for routine visits.  Her catheter was placed about 3 weeks ago.  She has been draining this daily of approximately 1 L of dark fluid.  She states she feels significantly better after she drains her Pleurx catheter.  The issue has been that she ran out of catheters and needed to come to the emergency room department.  She now has the appropriate materials.  Overall she states that she feels quite tired and weak but is not short of breath right now.   Vitals:   06/15/18 1119  BP: 110/68  Temp: 97.7 F (36.5 C)     EXAM:    Resp: Lungs are clear on the right but diminished on the left.  No respiratory distress, normal effort. Heart:  Regular without murmurs Abd:  Abdomen is soft, non distended and non tender. No masses are palpable.  There is no rebound and no guarding.  Neurological: Alert and oriented to person, place, and time. Coordination normal.  Skin: Skin is warm and dry. No rash noted. No diaphoretic. No erythema. No pallor.  Psychiatric: Normal mood and affect. Normal behavior. Judgment and thought content normal.    The catheter entrance site is clean dry and intact.  I did remove the stitch that was holding it in place.  ASSESSMENT: We drained about 1.5 L of fluid today in the office.  She states she tolerated that reasonably well but did have some discomfort at the end of the catheter drainage   PLAN:   Because of the current healthcare environment we are only scheduling patients who absolutely need to be seen.  Therefore I will allow our oncologist to manage her current tube.  However if there are any questions I am immediately available for any needs.  We did not make return visit for her.    Nestor Lewandowsky, MD

## 2018-06-15 NOTE — Patient Instructions (Signed)
We will call patient with further instructions. Her new order has been faxed over to [email or web address] for the pleurx drain.   Call the office with any questions or concerns

## 2018-06-15 NOTE — Telephone Encounter (Signed)
Josh- thank you for taking care of this.   GB

## 2018-06-17 ENCOUNTER — Encounter: Payer: Self-pay | Admitting: Cardiothoracic Surgery

## 2018-06-17 ENCOUNTER — Other Ambulatory Visit: Payer: Self-pay | Admitting: Hospice and Palliative Medicine

## 2018-06-17 ENCOUNTER — Other Ambulatory Visit: Payer: Self-pay

## 2018-06-17 MED ORDER — LORAZEPAM 0.5 MG PO TABS: 0.5000 mg | ORAL_TABLET | Freq: Three times a day (TID) | ORAL | 0 refills | Status: AC | PRN

## 2018-06-17 NOTE — Progress Notes (Signed)
Spoke with Opal Sidles, RN with hospice. Patient is reportedly having anxiety and problems sleeping at night. Will start lorzapem prn.   I also spoke with Lorriane Shire, RN with hospice [phone]), who will be patient's primary nurse.   Plan: -Lorazepam 0.5mg  po Q8H prn for nausea (Rx #30)

## 2018-06-18 ENCOUNTER — Telehealth: Payer: Self-pay | Admitting: Internal Medicine

## 2018-06-18 ENCOUNTER — Encounter: Payer: Self-pay | Admitting: Internal Medicine

## 2018-06-18 ENCOUNTER — Telehealth: Payer: Self-pay | Admitting: *Deleted

## 2018-06-24 NOTE — Telephone Encounter (Signed)
Spoke to pt's husband; and offered condolences.

## 2018-06-24 NOTE — Telephone Encounter (Signed)
Hospice nurse Tanya Vega called to report that patient expired this morning at 6:33 AM (it [birth year] today)

## 2018-06-24 DEATH — deceased

## 2019-02-19 IMAGING — CT NM PET TUM IMG INITIAL (PI) WHOLE BODY
1 of 10 series · 2 of 25 positions shown · non-contrast
Comparison: Brain MRI 02/22/2017, CT abdomen pelvis 02/12/2017

CLINICAL DATA: Initial treatment strategy for malignant melanoma.

EXAM:
NUCLEAR MEDICINE PET WHOLE BODY
TECHNIQUE: 13.6 mCi F-18 FDG was injected intravenously. Full-ring PET imaging
was performed from the vertex to the feet after the radiotracer. CT
data was obtained and used for attenuation correction and anatomic
localization.
FASTING BLOOD GLUCOSE:  Value: 102 mg/dl

[Series 3: ct wb 5.0 b30f · axial · 5.0mm · 0.98mm/px · z∈[-1372,-770]mm · 2 of 603 slices shown]
[im 201/603  soft-tissue]
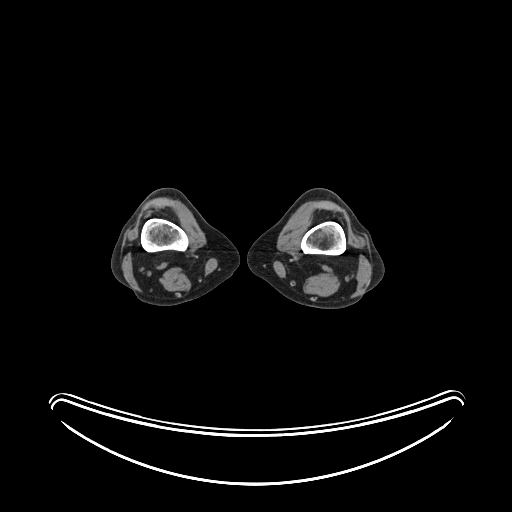
[im 402/603  soft-tissue]
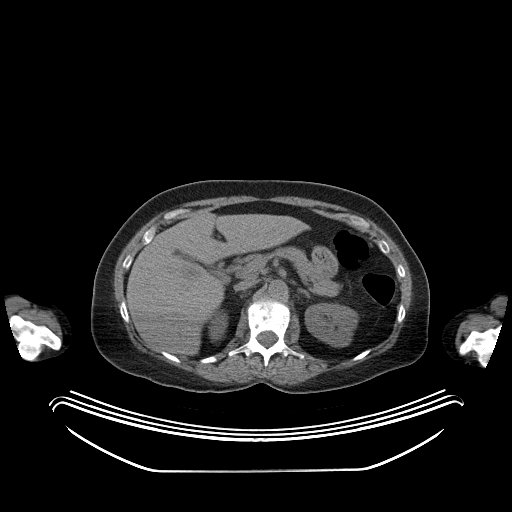

[2 of 25 positions shown; findings below may reference images not displayed]

FINDINGS: HEAD/NECK

Focal metabolic activity in the high RIGHT frontal lobe corresponds
to 17 mm lesion on CT and enhancing lesion on MRI. Lesion is intense
with metabolic activity above background brain activity

Hypermetabolic new RIGHT level 2 lymph node measuring 13 mm SUV max
equal 17.7.

CHEST

Innumerable bilateral round hypermetabolic pulmonary nodules of
varying size. Largest nodule measures 22 mm in the RIGHT middle lobe
with SUV max equal 9.3. Example nodule in the LEFT lower lobe
measures 14 mm (image 158, series 3) with SUV max equal 9.6.

Hypermetabolic LEFT hilar lymph node noted

ABDOMEN/PELVIS

Approximately 6 hypermetabolic lesions within the liver. Lesions
measure 1-2 cm. Example lesion in the central LEFT hepatic lobe
measures 17 mm SUV max equal 15. Lesion inferior RIGHT hepatic lobe
with SUV max equals

Hypermetabolic lesion within the cortex of the RIGHT kidney extends
partially exophytic measuring 15 mm SUV max equal 14.8.

Two hypermetabolic mesenteric masses. The more superior centrally in
the lower abdomen measures 21 mm (image 260, series 3 )with SUV max
19.4. Second lesion just superior to the bladder measures 3.0 cm
(image 284, series 3 with intense radiotracer activity (SUV max
equal 30).

SKELETON

Several discrete hypermetabolic skeletal metastasis. Two lesions in
the LEFT femur with SUV max equal 10.0. Lytic lesion in the L1
vertebral body with SUV max equal 12.3. Pathologic fracture this
level. Small lesion in the midthoracic spine

EXTREMITIES

Hypermetabolic lesions in the LEFT femur

Hypermetabolic muscle lesion in the LEFT adductor muscles.
IMPRESSION: 1. Multi organ metastatic melanoma with hypermetabolic lesions
within the brain, lungs, liver, RIGHT kidney, and peritoneal
metastasis.
2. Hypermetabolic adenopathy involving the a RIGHT level 2 cervical
lymph node and LEFT hilar node.
3. Hypermetabolic skeletal lesions involving the spine and LEFT
femur. Pathologic fracture at L1.
